# Patient Record
Sex: Female | Born: 1960 | Hispanic: No | Marital: Married | Smoking: Former smoker
Health system: Southern US, Community
[De-identification: ages and names within clinical notes are randomized; demographics above are authoritative.]

## PROBLEM LIST (undated history)

## (undated) DIAGNOSIS — D649 Anemia, unspecified: Secondary | ICD-10-CM

## (undated) DIAGNOSIS — K5790 Diverticulosis of intestine, part unspecified, without perforation or abscess without bleeding: Secondary | ICD-10-CM

## (undated) DIAGNOSIS — N809 Endometriosis, unspecified: Secondary | ICD-10-CM

## (undated) DIAGNOSIS — G8929 Other chronic pain: Secondary | ICD-10-CM

## (undated) DIAGNOSIS — K635 Polyp of colon: Secondary | ICD-10-CM

## (undated) DIAGNOSIS — K219 Gastro-esophageal reflux disease without esophagitis: Secondary | ICD-10-CM

## (undated) DIAGNOSIS — R011 Cardiac murmur, unspecified: Secondary | ICD-10-CM

## (undated) DIAGNOSIS — H269 Unspecified cataract: Secondary | ICD-10-CM

## (undated) DIAGNOSIS — K625 Hemorrhage of anus and rectum: Secondary | ICD-10-CM

## (undated) DIAGNOSIS — K649 Unspecified hemorrhoids: Secondary | ICD-10-CM

## (undated) DIAGNOSIS — A048 Other specified bacterial intestinal infections: Secondary | ICD-10-CM

## (undated) DIAGNOSIS — D126 Benign neoplasm of colon, unspecified: Secondary | ICD-10-CM

## (undated) DIAGNOSIS — F419 Anxiety disorder, unspecified: Secondary | ICD-10-CM

## (undated) DIAGNOSIS — M549 Dorsalgia, unspecified: Secondary | ICD-10-CM

## (undated) DIAGNOSIS — K760 Fatty (change of) liver, not elsewhere classified: Secondary | ICD-10-CM

## (undated) DIAGNOSIS — M1990 Unspecified osteoarthritis, unspecified site: Secondary | ICD-10-CM

## (undated) DIAGNOSIS — E079 Disorder of thyroid, unspecified: Secondary | ICD-10-CM

## (undated) DIAGNOSIS — K648 Other hemorrhoids: Secondary | ICD-10-CM

## (undated) DIAGNOSIS — Q249 Congenital malformation of heart, unspecified: Secondary | ICD-10-CM

## (undated) HISTORY — DX: Polyp of colon: K63.5

## (undated) HISTORY — DX: Other hemorrhoids: K64.8

## (undated) HISTORY — DX: Other specified bacterial intestinal infections: A04.8

## (undated) HISTORY — DX: Disorder of thyroid, unspecified: E07.9

## (undated) HISTORY — DX: Unspecified osteoarthritis, unspecified site: M19.90

## (undated) HISTORY — PX: BREAST BIOPSY: SHX20

## (undated) HISTORY — DX: Unspecified hemorrhoids: K64.9

## (undated) HISTORY — DX: Cardiac murmur, unspecified: R01.1

## (undated) HISTORY — DX: Hemorrhage of anus and rectum: K62.5

## (undated) HISTORY — DX: Anemia, unspecified: D64.9

## (undated) HISTORY — DX: Gastro-esophageal reflux disease without esophagitis: K21.9

## (undated) HISTORY — DX: Congenital malformation of heart, unspecified: Q24.9

## (undated) HISTORY — DX: Endometriosis, unspecified: N80.9

## (undated) HISTORY — PX: MENISCUS REPAIR: SHX5179

## (undated) HISTORY — DX: Anxiety disorder, unspecified: F41.9

## (undated) HISTORY — DX: Fatty (change of) liver, not elsewhere classified: K76.0

## (undated) HISTORY — DX: Benign neoplasm of colon, unspecified: D12.6

## (undated) HISTORY — DX: Unspecified cataract: H26.9

## (undated) HISTORY — DX: Diverticulosis of intestine, part unspecified, without perforation or abscess without bleeding: K57.90

## (undated) NOTE — *Deleted (*Deleted)
Thank you for coming to see me today. It was a pleasure  Please follow-up with PCP as needed  If you have any questions or concerns, please do not hesitate to call the office at [phone].  Best,   Tanya Walsh, MD Family Medicine Residency   Outpatient Mental Health Providers (No Insurance required or Self Pay)  [facility]  [address] [place], [place] [phone] Crisis [phone]  [facility] ([place]) can see uninsured folks for outpatient therapy [email or web address] [address] [place], [place] [address] [phone]  [facility]    Walk-in Mon-Fri, 8am-3pm [email or web address] [address], [place], [place]  [phone]   [address]  [place] [phone] [facility] [place] [facility] for psych med management, there may be a wait- if [facility] is working with clients for OPT, they will coordinate with [facility] for psych  [facility]   Walk-in-Clinic: Monday- Friday 9:00 AM - 4:00 PM [address]   [place], [place] [phone]  [facility] [place] ([place]) walk in M-F 8am-12pm and  1pm-3pm [place]- [address]     [phone]  [place] -[address]  Phone: [phone]  [facility] (Mental Health and substance challenges) [address]   [place] [phone]    [email or web address]    [facility]  [place]     Phone:  [phone] [place]-  [address]  [phone]   [facility]  [address] [place]  [phone] [email or web address]   [facility] ( virtual or zoom therapy) [email or web address]  [address] [place]  [phone]    [facility] [phone]  grief counseling, dementia and caregiver support    [facility] Walk-in MWF 12:30 to 3:00      [address] [place] [address]  [phone]  [email or web address] call to schedule an appointment    [facility]  Wellness Classes ,Support group, Peer support services, [address], [place], [place] [address] [phone]  www.[email or web address]           [facility] on [facility] (NAMI) [place]- Wellness classes, Support groups        [address], [place], [place] [address] [phone]   https://www.nami.org/   [facility]  (Psycho-social Rehabilitation clubhouse, Individual and group therapy) [address] , [place] [address]   [phone]  24- Hour Availability:  *[facility] [phone] or 1-800-711-2635 * [facility] Crisis (Domestic Violence, Rape, etc. )[phone] * [facility] [phone] or [phone] * [facility] [place] [facility] [phone](8am-4pm only) [phone] (after hours) *[facility] Unit 1-877-626-1772 *[place] National Suicide Hotline 1-800-273-8255 (TALK)    

---

## 1988-10-27 HISTORY — PX: DIAGNOSTIC LAPAROSCOPY: SUR761

## 2000-02-27 ENCOUNTER — Emergency Department (HOSPITAL_COMMUNITY): Admission: EM | Admit: 2000-02-27 | Discharge: 2000-02-27 | Payer: Self-pay | Admitting: Emergency Medicine

## 2000-05-06 ENCOUNTER — Emergency Department (HOSPITAL_COMMUNITY): Admission: EM | Admit: 2000-05-06 | Discharge: 2000-05-06 | Payer: Self-pay | Admitting: Emergency Medicine

## 2001-01-15 ENCOUNTER — Other Ambulatory Visit: Admission: RE | Admit: 2001-01-15 | Discharge: 2001-01-15 | Payer: Self-pay | Admitting: Family Medicine

## 2001-01-20 ENCOUNTER — Other Ambulatory Visit: Admission: RE | Admit: 2001-01-20 | Discharge: 2001-01-20 | Payer: Self-pay | Admitting: Family Medicine

## 2001-01-20 ENCOUNTER — Encounter: Admission: RE | Admit: 2001-01-20 | Discharge: 2001-01-20 | Payer: Self-pay | Admitting: Family Medicine

## 2001-01-20 ENCOUNTER — Encounter (INDEPENDENT_AMBULATORY_CARE_PROVIDER_SITE_OTHER): Payer: Self-pay | Admitting: *Deleted

## 2001-01-20 ENCOUNTER — Encounter: Payer: Self-pay | Admitting: Family Medicine

## 2001-04-27 ENCOUNTER — Emergency Department (HOSPITAL_COMMUNITY): Admission: EM | Admit: 2001-04-27 | Discharge: 2001-04-27 | Payer: Self-pay | Admitting: Emergency Medicine

## 2001-08-18 ENCOUNTER — Encounter: Payer: Self-pay | Admitting: Family Medicine

## 2001-08-18 ENCOUNTER — Encounter: Admission: RE | Admit: 2001-08-18 | Discharge: 2001-08-18 | Payer: Self-pay | Admitting: Family Medicine

## 2002-02-23 ENCOUNTER — Other Ambulatory Visit: Admission: RE | Admit: 2002-02-23 | Discharge: 2002-02-23 | Payer: Self-pay | Admitting: Obstetrics and Gynecology

## 2002-03-15 ENCOUNTER — Encounter: Admission: RE | Admit: 2002-03-15 | Discharge: 2002-03-15 | Payer: Self-pay | Admitting: Family Medicine

## 2002-03-15 ENCOUNTER — Encounter: Payer: Self-pay | Admitting: Family Medicine

## 2003-08-03 ENCOUNTER — Encounter: Admission: RE | Admit: 2003-08-03 | Discharge: 2003-08-03 | Payer: Self-pay | Admitting: Family Medicine

## 2003-08-03 ENCOUNTER — Encounter: Payer: Self-pay | Admitting: Family Medicine

## 2003-08-04 ENCOUNTER — Other Ambulatory Visit: Admission: RE | Admit: 2003-08-04 | Discharge: 2003-08-04 | Payer: Self-pay | Admitting: Obstetrics and Gynecology

## 2004-03-21 ENCOUNTER — Encounter: Admission: RE | Admit: 2004-03-21 | Discharge: 2004-03-21 | Payer: Self-pay | Admitting: Obstetrics and Gynecology

## 2006-03-12 ENCOUNTER — Encounter: Admission: RE | Admit: 2006-03-12 | Discharge: 2006-03-12 | Payer: Self-pay | Admitting: Internal Medicine

## 2006-03-16 ENCOUNTER — Other Ambulatory Visit: Admission: RE | Admit: 2006-03-16 | Discharge: 2006-03-16 | Payer: Self-pay | Admitting: Obstetrics and Gynecology

## 2007-03-15 ENCOUNTER — Encounter: Admission: RE | Admit: 2007-03-15 | Discharge: 2007-03-15 | Payer: Self-pay | Admitting: Obstetrics and Gynecology

## 2008-01-19 ENCOUNTER — Encounter: Admission: RE | Admit: 2008-01-19 | Discharge: 2008-01-19 | Payer: Self-pay | Admitting: Obstetrics and Gynecology

## 2008-08-22 ENCOUNTER — Encounter: Admission: RE | Admit: 2008-08-22 | Discharge: 2008-08-22 | Payer: Self-pay | Admitting: Obstetrics and Gynecology

## 2009-08-23 ENCOUNTER — Encounter: Admission: RE | Admit: 2009-08-23 | Discharge: 2009-08-23 | Payer: Self-pay | Admitting: Obstetrics and Gynecology

## 2010-08-15 ENCOUNTER — Encounter: Admission: RE | Admit: 2010-08-15 | Discharge: 2010-08-15 | Payer: Self-pay | Admitting: Obstetrics and Gynecology

## 2010-09-04 ENCOUNTER — Encounter: Admission: RE | Admit: 2010-09-04 | Discharge: 2010-09-04 | Payer: Self-pay | Admitting: Obstetrics and Gynecology

## 2010-10-27 DIAGNOSIS — E079 Disorder of thyroid, unspecified: Secondary | ICD-10-CM

## 2010-10-27 HISTORY — DX: Disorder of thyroid, unspecified: E07.9

## 2010-11-17 ENCOUNTER — Encounter: Payer: Self-pay | Admitting: Obstetrics and Gynecology

## 2011-07-09 ENCOUNTER — Emergency Department (HOSPITAL_COMMUNITY)
Admission: EM | Admit: 2011-07-09 | Discharge: 2011-07-09 | Disposition: A | Discharge status: Home / Self Care | Payer: Medicaid Other | Attending: Emergency Medicine | Admitting: Emergency Medicine

## 2011-07-09 ENCOUNTER — Emergency Department (HOSPITAL_COMMUNITY): Payer: Medicaid Other

## 2011-07-09 DIAGNOSIS — Y92009 Unspecified place in unspecified non-institutional (private) residence as the place of occurrence of the external cause: Secondary | ICD-10-CM | POA: Insufficient documentation

## 2011-07-09 DIAGNOSIS — W2203XA Walked into furniture, initial encounter: Secondary | ICD-10-CM | POA: Insufficient documentation

## 2011-07-09 DIAGNOSIS — S59909A Unspecified injury of unspecified elbow, initial encounter: Secondary | ICD-10-CM | POA: Insufficient documentation

## 2011-07-09 DIAGNOSIS — S6990XA Unspecified injury of unspecified wrist, hand and finger(s), initial encounter: Secondary | ICD-10-CM | POA: Insufficient documentation

## 2011-07-09 DIAGNOSIS — M25439 Effusion, unspecified wrist: Secondary | ICD-10-CM | POA: Insufficient documentation

## 2011-07-09 DIAGNOSIS — S60219A Contusion of unspecified wrist, initial encounter: Secondary | ICD-10-CM | POA: Insufficient documentation

## 2011-07-09 DIAGNOSIS — M25539 Pain in unspecified wrist: Secondary | ICD-10-CM | POA: Insufficient documentation

## 2011-08-07 ENCOUNTER — Other Ambulatory Visit: Payer: Self-pay | Admitting: Obstetrics and Gynecology

## 2011-08-07 DIAGNOSIS — Z1231 Encounter for screening mammogram for malignant neoplasm of breast: Secondary | ICD-10-CM

## 2011-09-09 ENCOUNTER — Ambulatory Visit: Payer: Medicaid Other

## 2011-09-22 ENCOUNTER — Ambulatory Visit
Admission: RE | Admit: 2011-09-22 | Discharge: 2011-09-22 | Disposition: A | Discharge status: Home / Self Care | Payer: Medicaid Other | Source: Ambulatory Visit | Attending: Obstetrics and Gynecology | Admitting: Obstetrics and Gynecology

## 2011-09-22 DIAGNOSIS — Z1231 Encounter for screening mammogram for malignant neoplasm of breast: Secondary | ICD-10-CM

## 2011-10-28 HISTORY — PX: BIOPSY THYROID: PRO38

## 2011-12-25 ENCOUNTER — Emergency Department (HOSPITAL_COMMUNITY): Payer: Self-pay

## 2011-12-25 ENCOUNTER — Emergency Department (HOSPITAL_COMMUNITY)
Admission: EM | Admit: 2011-12-25 | Discharge: 2011-12-25 | Disposition: A | Discharge status: Home Health | Payer: Self-pay | Attending: Emergency Medicine | Admitting: Emergency Medicine

## 2011-12-25 ENCOUNTER — Encounter (HOSPITAL_COMMUNITY): Payer: Self-pay | Admitting: Emergency Medicine

## 2011-12-25 DIAGNOSIS — F17200 Nicotine dependence, unspecified, uncomplicated: Secondary | ICD-10-CM | POA: Insufficient documentation

## 2011-12-25 DIAGNOSIS — M549 Dorsalgia, unspecified: Secondary | ICD-10-CM | POA: Insufficient documentation

## 2011-12-25 DIAGNOSIS — R0602 Shortness of breath: Secondary | ICD-10-CM | POA: Insufficient documentation

## 2011-12-25 MED ORDER — ONDANSETRON HCL 4 MG PO TABS: 4.0000 mg | ORAL_TABLET | Freq: Four times a day (QID) | ORAL | Status: AC

## 2011-12-25 MED ORDER — OXYCODONE-ACETAMINOPHEN 5-325 MG PO TABS: 1.0000 | ORAL_TABLET | Freq: Four times a day (QID) | ORAL | Status: AC | PRN

## 2011-12-25 MED ORDER — CYCLOBENZAPRINE HCL 10 MG PO TABS
5.0000 mg | ORAL_TABLET | Freq: Once | ORAL | Status: AC
  Administered 2011-12-25: 5 mg via ORAL
  Filled 2011-12-25: qty 1

## 2011-12-25 MED ORDER — OXYCODONE-ACETAMINOPHEN 5-325 MG PO TABS
2.0000 | ORAL_TABLET | Freq: Once | ORAL | Status: AC
  Administered 2011-12-25: 2 via ORAL
  Filled 2011-12-25: qty 2

## 2011-12-25 MED ORDER — ONDANSETRON 4 MG PO TBDP
8.0000 mg | ORAL_TABLET | Freq: Once | ORAL | Status: AC
  Administered 2011-12-25: 8 mg via ORAL
  Filled 2011-12-25: qty 2

## 2011-12-25 NOTE — ED Notes (Signed)
Patient with back pain that started on Sunday night.  Patient states she has been trying OTC meds and muscle relaxants, with no relief of pain.  Denies any injury.

## 2011-12-25 NOTE — Discharge Instructions (Signed)
Back Exercises Back exercises help treat and prevent back injuries. The goal of back exercises is to increase the strength of your abdominal and back muscles and the flexibility of your back. These exercises should be started when you no longer have back pain. Back exercises include:  Pelvic Tilt. Lie on your back with your knees bent. Tilt your pelvis until the lower part of your back is against the floor. Hold this position 5 to 10 sec and repeat 5 to 10 times.   Knee to Chest. Pull first 1 knee up against your chest and hold for 20 to 30 seconds, repeat this with the other knee, and then both knees. This may be done with the other leg straight or bent, whichever feels better.   Sit-Ups or Curl-Ups. Bend your knees 90 degrees. Start with tilting your pelvis, and do a partial, slow sit-up, lifting your trunk only 30 to 45 degrees off the floor. Take at least 2 to 3 seconds for each sit-up. Do not do sit-ups with your knees out straight. If partial sit-ups are difficult, simply do the above but with only tightening your abdominal muscles and holding it as directed.   Hip-Lift. Lie on your back with your knees flexed 90 degrees. Push down with your feet and shoulders as you raise your hips a couple inches off the floor; hold for 10 seconds, repeat 5 to 10 times.   Back arches. Lie on your stomach, propping yourself up on bent elbows. Slowly press on your hands, causing an arch in your low back. Repeat 3 to 5 times. Any initial stiffness and discomfort should lessen with repetition over time.   Shoulder-Lifts. Lie face down with arms beside your body. Keep hips and torso pressed to floor as you slowly lift your head and shoulders off the floor.  Do not overdo your exercises, especially in the beginning. Exercises may cause you some mild back discomfort which lasts for a few minutes; however, if the pain is more severe, or lasts for more than 15 minutes, do not continue exercises until you see your  caregiver. Improvement with exercise therapy for back problems is slow.  See your caregivers for assistance with developing a proper back exercise program. Document Released: 11/20/2004 Document Revised: 06/11/2011 Document Reviewed: 10/13/2005 ExitCare Patient Information 2012 [employer].Back Pain, Adult Low back pain is very common. About 1 in 5 people have back pain.The cause of low back pain is rarely dangerous. The pain often gets better over time.About half of people with a sudden onset of back pain feel better in just 2 weeks. About 8 in 10 people feel better by 6 weeks.  CAUSES Some common causes of back pain include:  Strain of the muscles or ligaments supporting the spine.   Wear and tear (degeneration) of the spinal discs.   Arthritis.   Direct injury to the back.  DIAGNOSIS Most of the time, the direct cause of low back pain is not known.However, back pain can be treated effectively even when the exact cause of the pain is unknown.Answering your caregiver's questions about your overall health and symptoms is one of the most accurate ways to make sure the cause of your pain is not dangerous. If your caregiver needs more information, he or she may order lab work or imaging tests (X-rays or MRIs).However, even if imaging tests show changes in your back, this usually does not require surgery. HOME CARE INSTRUCTIONS For many people, back pain returns.Since low back pain is rarely   dangerous, it is often a condition that people can learn to manageon their own.   Remain active. It is stressful on the back to sit or stand in one place. Do not sit, drive, or stand in one place for more than 30 minutes at a time. Take short walks on level surfaces as soon as pain allows.Try to increase the length of time you walk each day.   Do not stay in bed.Resting more than 1 or 2 days can delay your recovery.   Do not avoid exercise or work.Your body is made to move.It is not dangerous  to be active, even though your back may hurt.Your back will likely heal faster if you return to being active before your pain is gone.   Pay attention to your body when you bend and lift. Many people have less discomfortwhen lifting if they bend their knees, keep the load close to their bodies,and avoid twisting. Often, the most comfortable positions are those that put less stress on your recovering back.   Find a comfortable position to sleep. Use a firm mattress and lie on your side with your knees slightly bent. If you lie on your back, put a pillow under your knees.   Only take over-the-counter or prescription medicines as directed by your caregiver. Over-the-counter medicines to reduce pain and inflammation are often the most helpful.Your caregiver may prescribe muscle relaxant drugs.These medicines help dull your pain so you can more quickly return to your normal activities and healthy exercise.   Put ice on the injured area.   Put ice in a plastic bag.   Place a towel between your skin and the bag.   Leave the ice on for 15 to 20 minutes, 3 to 4 times a day for the first 2 to 3 days. After that, ice and heat may be alternated to reduce pain and spasms.   Ask your caregiver about trying back exercises and gentle massage. This may be of some benefit.   Avoid feeling anxious or stressed.Stress increases muscle tension and can worsen back pain.It is important to recognize when you are anxious or stressed and learn ways to manage it.Exercise is a great option.  SEEK MEDICAL CARE IF:  You have pain that is not relieved with rest or medicine.   You have pain that does not improve in 1 week.   You have new symptoms.   You are generally not feeling well.  SEEK IMMEDIATE MEDICAL CARE IF:   You have pain that radiates from your back into your legs.   You develop new bowel or bladder control problems.   You have unusual weakness or numbness in your arms or legs.   You develop  nausea or vomiting.   You develop abdominal pain.   You feel faint.  Document Released: 10/13/2005 Document Revised: 06/25/2011 Document Reviewed: 03/03/2011 ExitCare Patient Information 2012 [employer]. 

## 2011-12-25 NOTE — ED Provider Notes (Signed)
History     CSN: [id number]  Arrival date & time 12/25/11  0630   First MD Initiated Contact with Patient 12/25/11 [phone]      Chief Complaint  Patient presents with  . Back Pain    (Consider location/radiation/quality/duration/timing/severity/associated sxs/prior treatment) HPI  Pt presents to the ED with complaints of back pain. She has known back problems for which she see's a Land. She states that the symptoms started o nSunday night. She has tried OTC medications but she states they are not working. The pain is worse when she moves. She states that she also has a constant dull ache that worsens when she tries to sit up or turns her torso. The symptoms she currently has are similar to her other back pain symptoms in the past. She denies weakness, dizziness, urinary or bowel symptoms.  History reviewed. No pertinent past medical history.  History reviewed. No pertinent past surgical history.  History reviewed. No pertinent family history.  History  Substance Use Topics  . Smoking status: Current Everyday Smoker -- 0.5 packs/day for 15 years    Types: Cigarettes  . Smokeless tobacco: Not on file  . Alcohol Use: Yes    OB History    Grav Para Term Preterm Abortions TAB SAB Ect Mult Living                  Review of Systems  Allergies  Review of patient's allergies indicates no known allergies.  Home Medications   Current Outpatient Rx  Name Route Sig Dispense Refill  . ALPRAZOLAM 0.25 MG PO TABS Oral Take 0.25 mg by mouth at bedtime as needed. For sleep Rarely used    . CHOLECALCIFEROL 400 UNITS PO TABS Oral Take 400 Units by mouth daily.    . CYCLOBENZAPRINE HCL 5 MG PO TABS Oral Take 5 mg by mouth once. pain    . IBUPROFEN 200 MG PO TABS Oral Take 400 mg by mouth once. pain    . MAGNESIUM CHLORIDE 64 MG PO TBEC Oral Take by mouth.    . ONDANSETRON HCL 4 MG PO TABS Oral Take 1 tablet (4 mg total) by mouth every 6 (six) hours. 12 tablet 0  .  OXYCODONE-ACETAMINOPHEN 5-325 MG PO TABS Oral Take 1 tablet by mouth every 6 (six) hours as needed for pain. 10 tablet 0    BP 129/78  Pulse 75  Temp(Src) 97.4 F (36.3 C) (Oral)  Resp 20  SpO2 100%  Physical Exam  Nursing note and vitals reviewed. Constitutional: She appears well-developed and well-nourished. No distress.  HENT:  Head: Normocephalic and atraumatic.  Eyes: Pupils are equal, round, and reactive to light.  Neck: Normal range of motion. Neck supple.  Cardiovascular: Normal rate and regular rhythm.   Pulmonary/Chest: Effort normal.  Abdominal: Soft.  Musculoskeletal:       Thoracic back: She exhibits decreased range of motion and spasm. She exhibits no tenderness, no bony tenderness, no swelling, no edema, no deformity, no laceration, no pain and normal pulse.       Back:  Neurological: She is alert.  Skin: Skin is warm and dry.    ED Course  Procedures (including critical care time)  Labs Reviewed - No data to display Dg Chest 2 View  12/25/2011  *RADIOLOGY REPORT*  Clinical Data: Shortness of breath.  Right-sided back pain.  CHEST - 2 VIEW  Comparison: None.  Findings: The heart size and vascularity are normal and the lungs are clear.  No  osseous abnormality.  IMPRESSION: Normal chest.  Original Report Authenticated By: Gwynn Burly, M.D.     1. Back pain       MDM  Pts xray of the chest shows no abnormalities within the lungs and the lateral view shows normal alinement of patient Thoracic spine. PT sees a Land as she has a history of back pains and informs me that she will  Follow-up with her doctor.Pt has flexeril at home. Will give Rx for Percocets (10 tabs) and Zofran.       Dorthula Matas, PA 12/25/11 [phone]

## 2011-12-25 NOTE — ED Provider Notes (Signed)
Medical screening examination/treatment/procedure(s) were performed by non-physician practitioner and as supervising physician I was immediately available for consultation/collaboration.   Nakina Spatz, MD 12/25/11 1036 

## 2012-04-28 ENCOUNTER — Emergency Department (HOSPITAL_COMMUNITY): Payer: Self-pay

## 2012-04-28 ENCOUNTER — Encounter (HOSPITAL_COMMUNITY): Payer: Self-pay | Admitting: *Deleted

## 2012-04-28 ENCOUNTER — Emergency Department (HOSPITAL_COMMUNITY)
Admission: EM | Admit: 2012-04-28 | Discharge: 2012-04-28 | Disposition: A | Discharge status: Home / Self Care | Payer: Self-pay | Attending: Emergency Medicine | Admitting: Emergency Medicine

## 2012-04-28 DIAGNOSIS — M545 Low back pain, unspecified: Secondary | ICD-10-CM | POA: Insufficient documentation

## 2012-04-28 DIAGNOSIS — M25579 Pain in unspecified ankle and joints of unspecified foot: Secondary | ICD-10-CM

## 2012-04-28 DIAGNOSIS — W19XXXA Unspecified fall, initial encounter: Secondary | ICD-10-CM

## 2012-04-28 DIAGNOSIS — W010XXA Fall on same level from slipping, tripping and stumbling without subsequent striking against object, initial encounter: Secondary | ICD-10-CM | POA: Insufficient documentation

## 2012-04-28 DIAGNOSIS — Y9229 Other specified public building as the place of occurrence of the external cause: Secondary | ICD-10-CM | POA: Insufficient documentation

## 2012-04-28 DIAGNOSIS — M549 Dorsalgia, unspecified: Secondary | ICD-10-CM

## 2012-04-28 DIAGNOSIS — F17200 Nicotine dependence, unspecified, uncomplicated: Secondary | ICD-10-CM | POA: Insufficient documentation

## 2012-04-28 HISTORY — DX: Other chronic pain: G89.29

## 2012-04-28 HISTORY — DX: Dorsalgia, unspecified: M54.9

## 2012-04-28 MED ORDER — IBUPROFEN 400 MG PO TABS
800.0000 mg | ORAL_TABLET | Freq: Once | ORAL | Status: AC
  Administered 2012-04-28: 800 mg via ORAL
  Filled 2012-04-28: qty 2

## 2012-04-28 MED ORDER — IBUPROFEN 800 MG PO TABS: 800.0000 mg | ORAL_TABLET | Freq: Three times a day (TID) | ORAL | Status: AC

## 2012-04-28 MED ORDER — OXYCODONE-ACETAMINOPHEN 5-325 MG PO TABS: 1.0000 | ORAL_TABLET | ORAL | Status: AC | PRN

## 2012-04-28 NOTE — ED Notes (Signed)
Pt slipped in [employer] on a wet floor.  No fall.  Pt did a quick twisting movement when this occurred and has pain in her left ankle and in her back now.  Pt is ambulatory

## 2012-04-28 NOTE — ED Provider Notes (Signed)
History     CSN: [id number]  Arrival date & time 04/28/12  3474   First MD Initiated Contact with Patient 04/28/12 2136      Chief Complaint  Patient presents with  . Back Pain    (Consider location/radiation/quality/duration/timing/severity/associated sxs/prior treatment) Patient is a 51 y.o. female presenting with back pain. The history is provided by the patient. No language interpreter was used.  Back Pain  This is a new problem. The current episode started 3 to 5 hours ago. The problem occurs constantly. The problem has not changed since onset.The pain is associated with falling. The pain is present in the lumbar spine. The pain does not radiate. The pain is at a severity of 7/10. Pertinent negatives include no fever, no numbness, no perianal numbness, no bladder incontinence, no dysuria, no leg pain, no paresthesias, no paresis, no tingling and no weakness. She has tried nothing for the symptoms.   States that she was in [employer] and slipped on some wine that was on the floor from a broken bottle. C/o LL back pain and L ankle pain.   pmh chronic back pain. Refused crutches. + cms to ankle.  Neuro in tact to LE. Past Medical History  Diagnosis Date  . Chronic back pain     History reviewed. No pertinent past surgical history.  No family history on file.  History  Substance Use Topics  . Smoking status: Current Everyday Smoker -- 0.5 packs/day for 15 years    Types: Cigarettes  . Smokeless tobacco: Not on file  . Alcohol Use: Yes    OB History    Grav Para Term Preterm Abortions TAB SAB Ect Mult Living                  Review of Systems  Constitutional: Negative.  Negative for fever.  HENT: Negative.   Eyes: Negative.   Respiratory: Negative.   Cardiovascular: Negative.   Gastrointestinal: Negative.   Genitourinary: Negative for bladder incontinence and dysuria.  Musculoskeletal: Positive for back pain. Negative for gait problem.       L ankle pain    Neurological: Negative.  Negative for tingling, weakness, numbness and paresthesias.  Psychiatric/Behavioral: Negative.   All other systems reviewed and are negative.    Allergies  Review of patient's allergies indicates no known allergies.  Home Medications  No current outpatient prescriptions on file.  BP 126/90  Pulse 70  Temp 99 F (37.2 C) (Oral)  Resp 20  SpO2 96%  Physical Exam  Nursing note and vitals reviewed. Constitutional: She is oriented to person, place, and time. She appears well-developed and well-nourished.  HENT:  Head: Normocephalic and atraumatic.  Eyes: Conjunctivae and EOM are normal. Pupils are equal, round, and reactive to light.  Neck: Normal range of motion. Neck supple.  Cardiovascular: Normal rate.   Pulmonary/Chest: Effort normal.  Abdominal: Soft.  Musculoskeletal: She exhibits tenderness. She exhibits no edema.       Limited ROM to L ankle  Lumbar spine tenderness  Neurological: She is alert and oriented to person, place, and time. She has normal reflexes.  Skin: Skin is warm and dry.  Psychiatric: She has a normal mood and affect.    ED Course  Procedures (including critical care time)  Labs Reviewed - No data to display Dg Ankle Complete Left  04/28/2012  *RADIOLOGY REPORT*  Clinical Data: Twisting injury, lateral pain.  LEFT ANKLE COMPLETE - 3+ VIEW  Comparison: None.  Findings: No acute  bony abnormality.  Specifically, no fracture, subluxation, or dislocation.  Soft tissues are intact.  IMPRESSION: Normal study.  Original Report Authenticated By: Cyndie Chime, M.D.     No diagnosis found.    MDM  Fall at [facility] with back pain and Lumbar pain. Lumbar and ankle films negative for fx.   No red flags.  No cauda equina symptoms.  Ibuprofen/ice in the ER.  Better wants to go home.  Refused crutches.  rx for percocet.  Follow up with pcp of choice.           Remi Haggard, NP 04/29/12 1824

## 2012-05-03 NOTE — ED Provider Notes (Signed)
Medical screening examination/treatment/procedure(s) were performed by non-physician practitioner and as supervising physician I was immediately available for consultation/collaboration.  Krystiana Fornes, MD 05/03/12 0717 

## 2012-07-14 ENCOUNTER — Ambulatory Visit: Payer: Medicaid Other | Admitting: Family Medicine

## 2012-07-20 ENCOUNTER — Telehealth: Payer: Self-pay | Admitting: Obstetrics and Gynecology

## 2012-07-20 NOTE — Telephone Encounter (Signed)
Jo to address  

## 2012-07-20 NOTE — Telephone Encounter (Signed)
TRIAGE/ADVICE °

## 2012-07-21 ENCOUNTER — Telehealth: Payer: Self-pay

## 2012-07-21 NOTE — Telephone Encounter (Signed)
LM for pt to cb to let me know if one of a couple of appt choices will work for her. The first choice is 08/02/2012 @ 11:15 w/ Dr. Su Hilt or 07/29/2012 @ 10:30 w/ EP. I will wait to hear back from pt.

## 2012-07-29 ENCOUNTER — Ambulatory Visit (INDEPENDENT_AMBULATORY_CARE_PROVIDER_SITE_OTHER): Payer: Medicaid Other | Admitting: Family Medicine

## 2012-07-29 ENCOUNTER — Encounter: Payer: Self-pay | Admitting: Family Medicine

## 2012-07-29 VITALS — BP 116/74 | HR 77 | Temp 99.3°F | Ht 69.0 in | Wt 144.0 lb

## 2012-07-29 DIAGNOSIS — E041 Nontoxic single thyroid nodule: Secondary | ICD-10-CM

## 2012-07-29 DIAGNOSIS — K921 Melena: Secondary | ICD-10-CM

## 2012-07-29 DIAGNOSIS — Z9889 Other specified postprocedural states: Secondary | ICD-10-CM

## 2012-07-29 DIAGNOSIS — K219 Gastro-esophageal reflux disease without esophagitis: Secondary | ICD-10-CM | POA: Insufficient documentation

## 2012-07-29 DIAGNOSIS — J019 Acute sinusitis, unspecified: Secondary | ICD-10-CM

## 2012-07-29 DIAGNOSIS — N809 Endometriosis, unspecified: Secondary | ICD-10-CM

## 2012-07-29 MED ORDER — DOXYCYCLINE HYCLATE 100 MG PO TABS: 100.0000 mg | ORAL_TABLET | Freq: Two times a day (BID) | ORAL | Status: DC

## 2012-07-29 NOTE — Patient Instructions (Signed)
Nice to meet you. You may have early sinus infection. If your symptoms are worsening in next 4 days, you may start antibiotic. Please come back if you develop shortness of breath, fever or worsening symptoms. Great job on stopping smoking! We can do yearly physical, or make appointment whenever you need.  Sinusitis Sinusitis is redness, soreness, and swelling (inflammation) of the paranasal sinuses. Paranasal sinuses are air pockets within the bones of your face (beneath the eyes, the middle of the forehead, or above the eyes). In healthy paranasal sinuses, mucus is able to drain out, and air is able to circulate through them by way of your nose. However, when your paranasal sinuses are inflamed, mucus and air can become trapped. This can allow bacteria and other germs to grow and cause infection. Sinusitis can develop quickly and last only a short time (acute) or continue over a long period (chronic). Sinusitis that lasts for more than 12 weeks is considered chronic.  CAUSES  Causes of sinusitis include:  Allergies.  Structural abnormalities, such as displacement of the cartilage that separates your nostrils (deviated septum), which can decrease the air flow through your nose and sinuses and affect sinus drainage.  Functional abnormalities, such as when the small hairs (cilia) that line your sinuses and help remove mucus do not work properly or are not present. SYMPTOMS  Symptoms of acute and chronic sinusitis are the same. The primary symptoms are pain and pressure around the affected sinuses. Other symptoms include:  Upper toothache.  Earache.  Headache.  Bad breath.  Decreased sense of smell and taste.  A cough, which worsens when you are lying flat.  Fatigue.  Fever.  Thick drainage from your nose, which often is green and may contain pus (purulent).  Swelling and warmth over the affected sinuses. DIAGNOSIS  Your caregiver will perform a physical exam. During the exam,  your caregiver may:  Look in your nose for signs of abnormal growths in your nostrils (nasal polyps).  Tap over the affected sinus to check for signs of infection.  View the inside of your sinuses (endoscopy) with a special imaging device with a light attached (endoscope), which is inserted into your sinuses. If your caregiver suspects that you have chronic sinusitis, one or more of the following tests may be recommended:  Allergy tests.  Nasal culture A sample of mucus is taken from your nose and sent to a lab and screened for bacteria.  Nasal cytology A sample of mucus is taken from your nose and examined by your caregiver to determine if your sinusitis is related to an allergy. TREATMENT  Most cases of acute sinusitis are related to a viral infection and will resolve on their own within 10 days. Sometimes medicines are prescribed to help relieve symptoms (pain medicine, decongestants, nasal steroid sprays, or saline sprays).  However, for sinusitis related to a bacterial infection, your caregiver will prescribe antibiotic medicines. These are medicines that will help kill the bacteria causing the infection.  Rarely, sinusitis is caused by a fungal infection. In theses cases, your caregiver will prescribe antifungal medicine. For some cases of chronic sinusitis, surgery is needed. Generally, these are cases in which sinusitis recurs more than 3 times per year, despite other treatments. HOME CARE INSTRUCTIONS   Drink plenty of water. Water helps thin the mucus so your sinuses can drain more easily.  Use a humidifier.  Inhale steam 3 to 4 times a day (for example, sit in the bathroom with the shower running).  Apply a warm, moist washcloth to your face 3 to 4 times a day, or as directed by your caregiver.  Use saline nasal sprays to help moisten and clean your sinuses.  Take over-the-counter or prescription medicines for pain, discomfort, or fever only as directed by your  caregiver. SEEK IMMEDIATE MEDICAL CARE IF:  You have increasing pain or severe headaches.  You have nausea, vomiting, or drowsiness.  You have swelling around your face.  You have vision problems.  You have a stiff neck.  You have difficulty breathing. MAKE SURE YOU:   Understand these instructions.  Will watch your condition.  Will get help right away if you are not doing well or get worse. Document Released: 10/13/2005 Document Revised: 01/05/2012 Document Reviewed: 10/28/2011 [facility] Patient Information 2013 [place], [place].

## 2012-07-29 NOTE — Assessment & Plan Note (Signed)
Followed by Dr. Su Hilt. Had mirena. Advised patient she can have pap screening done at [facility] if she wishes in the future.

## 2012-07-29 NOTE — Assessment & Plan Note (Signed)
Patient agrees to call GI specialist for colonoscopy scheduling.

## 2012-07-29 NOTE — Assessment & Plan Note (Signed)
Now longer than 2 weeks, uncertain if early bacterial infection now. Advised observation next few days and may start doxycycline (given paper rx) if worsens or not improving in next 3-4 days.

## 2012-07-29 NOTE — Assessment & Plan Note (Signed)
Patient plans to f/u with surgeon who did biopsy. Refuses bloodwork today. No signs of dysregulation.

## 2012-07-29 NOTE — Progress Notes (Signed)
Subjective:    Patient ID: Gail Gonzalez, female    DOB: [birth year], 51 y.o.   MRN: [id number]  HPI New patient to establish care.  1. Congestion. C/o "head cold" for 2 weeks. Cough with yellow productive sputum. Nasal congestion, fullness in ears and sinuses. Initial improvement now worsening. Denies fever, facial pain, chest pain, dyspnea, wheezing.  2. Tobacco abuse. Quit smoking 2 months ago after family bought her e-cigarettes. No cig in 3 weeks. Started smoking as a teenager  Then quit, but picked up again in 1996-2013 about 0.5 ppd. Denies dyspnea, wheezing.  3. Thyroid nodule. Found by OBGYN last year. Had a normal biopsy and bloodwork by surgeon, she is due for recheck now. Denies large change in weight, stool changes, skin rash, fatigue.  4. Endometriosis. Has an IUD placed by Dr. Ronelle Nigh. She plans to go get checked soon because she has been having period-like bleeding x 2 weeks. Hx laparascopic surgery for diagnostic purposes.  5. HM. Had normal pap 2012, yearly mammogram after a left breast lump found-biopsy negative she reports. Needs colonscopy. She reports seeing blood in her stool irregularly. Denies abdominal pain.  Past Medical History  Diagnosis Date  . Chronic back pain   . GERD (gastroesophageal reflux disease)   . Thyroid disease 2012    nodule  . Endometriosis     on lap surgery. seen by Dr. Su Hilt   Past Surgical History  Procedure Date  . Biopsy thyroid 2013   Family History  Problem Relation Age of Onset  . Alcohol abuse Mother   . Cancer Mother     breast  . Diabetes Mother   . Hypertension Mother   . Alcohol abuse Father    History   Social History  . Marital Status: Married    Spouse Name: N/A    Number of Children: N/A  . Years of Education: N/A   Occupational History  . Not on file.   Social History Main Topics  . Smoking status: Former Smoker -- 0.5 packs/day for 15 years    Types: Cigarettes    Quit date: 06/10/2012  .  Smokeless tobacco: Not on file  . Alcohol Use: Yes  . Drug Use: No  . Sexually Active: Yes   Other Topics Concern  . Not on file   Social History Narrative   High school graduate. Married to [employer]. Has 2 grown children in [place] and [place], [place]. Works full time at [employer].Smoking 1996-2013 -0.5 ppd.   Review of Systems  Constitutional: Negative for fever, appetite change, fatigue and unexpected weight change.  Respiratory: Negative for shortness of breath.   Cardiovascular: Negative for chest pain.  Gastrointestinal: Positive for blood in stool. Negative for nausea, vomiting, abdominal pain and constipation.  Genitourinary: Negative for dysuria.   Endorses some mild esophageal reflux. Knee pains. Otherwise see HPI.    Objective:   Physical Exam  Vitals reviewed. Constitutional: She is oriented to person, place, and time. She appears well-developed and well-nourished. No distress.  HENT:  Head: Normocephalic and atraumatic.       Mild pharyngeal redness. No exudates. Bil ant cervical LAD. nontender sinuses. Purulent nasal discharge. B TMs wnl.  Eyes: EOM are normal. Pupils are equal, round, and reactive to light.  Neck: Neck supple.  Cardiovascular: Normal rate and normal heart sounds.        II/VI syst murmur at apical area.  Pulmonary/Chest: Effort normal and breath sounds normal. No respiratory distress. She has no  wheezes. She has no rales.  Abdominal: Soft. Bowel sounds are normal. She exhibits no distension. There is no tenderness. There is no rebound and no guarding.  Musculoskeletal: She exhibits no edema and no tenderness.  Lymphadenopathy:    She has cervical adenopathy.  Neurological: She is alert and oriented to person, place, and time. No cranial nerve deficit. She exhibits normal muscle tone. Coordination normal.  Skin: No rash noted. She is not diaphoretic.  Psychiatric: She has a normal mood and affect.          Assessment & Plan:

## 2012-07-29 NOTE — Assessment & Plan Note (Signed)
No pain or red flags today. Discussed OTC options. Patient ok with current symptoms. Advised she needs a colonoscopy for both screening and positive endorsement of intermittent blood in stools. Provided with phone numbers and contact information, she agrees to schedule.

## 2012-08-02 ENCOUNTER — Encounter: Payer: Self-pay | Admitting: Obstetrics and Gynecology

## 2012-08-27 ENCOUNTER — Other Ambulatory Visit: Payer: Self-pay | Admitting: Obstetrics and Gynecology

## 2012-08-27 DIAGNOSIS — Z1231 Encounter for screening mammogram for malignant neoplasm of breast: Secondary | ICD-10-CM

## 2012-08-27 DIAGNOSIS — Z803 Family history of malignant neoplasm of breast: Secondary | ICD-10-CM

## 2012-09-14 ENCOUNTER — Ambulatory Visit (INDEPENDENT_AMBULATORY_CARE_PROVIDER_SITE_OTHER): Payer: Medicaid Other | Admitting: Family Medicine

## 2012-09-14 ENCOUNTER — Encounter: Payer: Self-pay | Admitting: Family Medicine

## 2012-09-14 DIAGNOSIS — M549 Dorsalgia, unspecified: Secondary | ICD-10-CM

## 2012-09-14 MED ORDER — CYCLOBENZAPRINE HCL 10 MG PO TABS: 10.0000 mg | ORAL_TABLET | Freq: Three times a day (TID) | ORAL | Status: DC | PRN

## 2012-09-14 MED ORDER — TRAMADOL HCL 50 MG PO TABS: 50.0000 mg | ORAL_TABLET | Freq: Three times a day (TID) | ORAL | Status: DC | PRN
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
## 2012-09-17 ENCOUNTER — Encounter: Payer: Self-pay | Admitting: Family Medicine

## 2012-09-17 DIAGNOSIS — M549 Dorsalgia, unspecified: Secondary | ICD-10-CM | POA: Insufficient documentation

## 2012-10-04 ENCOUNTER — Ambulatory Visit
Admission: RE | Admit: 2012-10-04 | Discharge: 2012-10-04 | Disposition: A | Discharge status: Home / Self Care | Payer: Medicaid Other | Source: Ambulatory Visit | Attending: Obstetrics and Gynecology | Admitting: Obstetrics and Gynecology

## 2012-10-04 DIAGNOSIS — Z1231 Encounter for screening mammogram for malignant neoplasm of breast: Secondary | ICD-10-CM

## 2012-10-04 DIAGNOSIS — Z803 Family history of malignant neoplasm of breast: Secondary | ICD-10-CM

## 2012-10-06 ENCOUNTER — Ambulatory Visit (INDEPENDENT_AMBULATORY_CARE_PROVIDER_SITE_OTHER): Payer: Medicaid Other | Admitting: Obstetrics and Gynecology

## 2012-10-06 ENCOUNTER — Encounter: Payer: Self-pay | Admitting: Obstetrics and Gynecology

## 2012-10-06 VITALS — BP 100/60 | HR 70 | Resp 16 | Ht 69.0 in | Wt 148.0 lb

## 2012-10-06 DIAGNOSIS — Z0000 Encounter for general adult medical examination without abnormal findings: Secondary | ICD-10-CM

## 2012-10-06 DIAGNOSIS — E010 Iodine-deficiency related diffuse (endemic) goiter: Secondary | ICD-10-CM

## 2012-10-06 DIAGNOSIS — N920 Excessive and frequent menstruation with regular cycle: Secondary | ICD-10-CM

## 2012-10-06 DIAGNOSIS — E049 Nontoxic goiter, unspecified: Secondary | ICD-10-CM

## 2012-10-06 DIAGNOSIS — Z124 Encounter for screening for malignant neoplasm of cervix: Secondary | ICD-10-CM

## 2012-10-06 DIAGNOSIS — Z139 Encounter for screening, unspecified: Secondary | ICD-10-CM

## 2012-10-06 LAB — CBC
MCHC: 33.8 g/dL (ref 30.0–36.0)
Platelets: 331 10*3/uL (ref 150–400)
RDW: 13.8 % (ref 11.5–15.5)

## 2012-10-06 MED ORDER — ZOLPIDEM TARTRATE 10 MG PO TABS: 10.0000 mg | ORAL_TABLET | Freq: Every evening | ORAL | Status: DC | PRN

## 2012-10-06 MED ORDER — ALPRAZOLAM 0.25 MG PO TABS: 0.2500 mg | ORAL_TABLET | Freq: Two times a day (BID) | ORAL | Status: DC | PRN
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
## 2012-10-07 LAB — PAP IG W/ RFLX HPV ASCU

## 2012-10-07 LAB — TSH: TSH: 0.86 u[IU]/mL (ref 0.350–4.500)

## 2012-10-07 LAB — PROLACTIN: Prolactin: 10.7 ng/mL

## 2012-10-11 ENCOUNTER — Other Ambulatory Visit: Payer: Self-pay | Admitting: Obstetrics and Gynecology

## 2012-10-11 ENCOUNTER — Other Ambulatory Visit: Payer: Medicaid Other

## 2012-10-11 ENCOUNTER — Other Ambulatory Visit: Payer: Self-pay

## 2012-10-11 ENCOUNTER — Telehealth: Payer: Self-pay

## 2012-10-11 DIAGNOSIS — N6310 Unspecified lump in the right breast, unspecified quadrant: Secondary | ICD-10-CM

## 2012-10-11 NOTE — Telephone Encounter (Signed)
LM that I sched appt for Dx mammo R breast for pt at the breast center for 10/19/2012 @ 09:15. I asked pt to cb to let me know that she got the message. Melody Comas A

## 2012-10-13 ENCOUNTER — Telehealth: Payer: Self-pay

## 2012-10-13 NOTE — Telephone Encounter (Signed)
Spoke to pt to let her know about her Dx Mammo and U/S on 10/19/2012 @ 09:20 @ [facility].[name], [name] A Also, pt cx'd her appt's for the 8th of Jan. She will r/s.

## 2012-10-19 ENCOUNTER — Ambulatory Visit
Admission: RE | Admit: 2012-10-19 | Discharge: 2012-10-19 | Disposition: A | Discharge status: Home / Self Care | Payer: Medicaid Other | Source: Ambulatory Visit | Attending: Obstetrics and Gynecology | Admitting: Obstetrics and Gynecology

## 2012-10-19 DIAGNOSIS — N6310 Unspecified lump in the right breast, unspecified quadrant: Secondary | ICD-10-CM

## 2012-11-01 ENCOUNTER — Ambulatory Visit
Admission: RE | Admit: 2012-11-01 | Discharge: 2012-11-01 | Disposition: A | Discharge status: Home / Self Care | Payer: Medicaid Other | Source: Ambulatory Visit | Attending: Obstetrics and Gynecology | Admitting: Obstetrics and Gynecology

## 2012-11-01 DIAGNOSIS — E010 Iodine-deficiency related diffuse (endemic) goiter: Secondary | ICD-10-CM

## 2012-11-03 ENCOUNTER — Encounter: Payer: Medicaid Other | Admitting: Obstetrics and Gynecology

## 2012-11-03 ENCOUNTER — Other Ambulatory Visit: Payer: Medicaid Other

## 2012-11-10 ENCOUNTER — Telehealth: Payer: Self-pay

## 2012-11-10 NOTE — Telephone Encounter (Signed)
LM for pt to cb re: getting scheduled for f/u , u/s and ebx per AR. Melody Comas A

## 2012-11-16 ENCOUNTER — Telehealth: Payer: Self-pay

## 2012-11-16 NOTE — Telephone Encounter (Signed)
LM for pt to cb to let me know if 12/08/2012 @ 2:30 for u/s and 3 pm appt. To f/u w/ AR + EBX will work for her. I will wait to hear back from her. Melody Comas A

## 2012-11-23 ENCOUNTER — Telehealth: Payer: Self-pay

## 2012-11-23 ENCOUNTER — Telehealth: Payer: Self-pay | Admitting: *Deleted

## 2012-11-23 DIAGNOSIS — E041 Nontoxic single thyroid nodule: Secondary | ICD-10-CM

## 2012-11-23 NOTE — Telephone Encounter (Signed)
Received message on Physician/Pharmacy line from [place] at [facility].  States Ms. Onofre has had a recent abnormal thyroid ultrasound and needs an ultrasound guided needle biopsy.  She needs a referral to an endocrinologist and since she has is medicaid, the referral needs to come from her PCP.   I spoke with Annice Pih.  Looks like patient has an endocrinologist at [facility].  I will place referral in Epic.  Ileana Ladd

## 2012-11-23 NOTE — Telephone Encounter (Signed)
Vaniah called back. She will have to check her work schedule to see when she will be able to schedule u/s, ebx and f/u. She also states that she has an Actor at [facility]. She has had a bx in the past. She will call back w/ contact info for her Dr. There so that I can fax the thyroid U/S result to him.  Dr. Talmage Nap is not accetpting new pt's at this time, either. Melody Comas A

## 2012-11-23 NOTE — Telephone Encounter (Signed)
I LM for pt to call me back re: a couple of things. She needs ASAP referral to Dr. Talmage Nap for abnl thyroid u/s. Her PCP needs to refer her, because of the Medicaid ins. I notified Dr. Cristal Ford of this and asked her office to call me. I called Dr. Willeen Cass office and aked them to cb . I need to know if their office takes Medicaid. Also, I need to speak to [facility] re: scheduling U/S, EBX and f/u with Dr. Su Hilt. Melody Comas A

## 2012-11-30 ENCOUNTER — Telehealth: Payer: Self-pay

## 2012-11-30 NOTE — Telephone Encounter (Signed)
Called pt to remind her to call me with info about Endocrinologist @ [facility]. And also to check her schedule and call me so that we can get her scheduled for U/S and EBX. Melody Comas A

## 2012-12-03 ENCOUNTER — Telehealth: Payer: Self-pay

## 2012-12-03 NOTE — Telephone Encounter (Signed)
Nariyah called with contact info for her Endocrinologist, Dr. Daiva Nakayama. I faxed records including notes, labs and recent thyroid ultrasound to this Dr @ [phone] for her to review and to inform pt of recommendations. Melody Comas A

## 2012-12-13 ENCOUNTER — Encounter: Payer: Self-pay | Admitting: Obstetrics and Gynecology

## 2012-12-13 ENCOUNTER — Other Ambulatory Visit: Payer: Self-pay | Admitting: Obstetrics and Gynecology

## 2012-12-13 ENCOUNTER — Ambulatory Visit: Payer: Medicaid Other

## 2012-12-13 ENCOUNTER — Ambulatory Visit: Payer: Medicaid Other | Admitting: Obstetrics and Gynecology

## 2012-12-13 VITALS — BP 98/68 | Resp 16 | Ht 66.5 in | Wt 150.0 lb

## 2012-12-13 DIAGNOSIS — N920 Excessive and frequent menstruation with regular cycle: Secondary | ICD-10-CM

## 2012-12-13 DIAGNOSIS — D251 Intramural leiomyoma of uterus: Secondary | ICD-10-CM

## 2012-12-13 NOTE — Progress Notes (Signed)
Here for f/u episode of bleeding in November.    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

## 2012-12-20 ENCOUNTER — Telehealth: Payer: Self-pay

## 2012-12-20 NOTE — Telephone Encounter (Signed)
LM for pt....responding to her call last Friday. Her Endocrinologist @ [facility] gave Gail Gonzalez the option to repeat thyroid U/S now or again in 6 months,as thyroid nodule has grown only slightly. [name] chooses to wait 6 mos and repeat then. She asks that Dr. Su Hilt refer her for that. I explained that that order should come from her Endocrinologist, as she is the one who is recommending it. Also, pt may need referral from Dr. Cristal Ford, PCP for this as well.Melody Comas A

## 2012-12-29 ENCOUNTER — Telehealth: Payer: Self-pay

## 2012-12-30 ENCOUNTER — Encounter: Payer: Self-pay | Admitting: Family Medicine

## 2012-12-30 ENCOUNTER — Telehealth: Payer: Self-pay | Admitting: Family Medicine

## 2012-12-30 ENCOUNTER — Ambulatory Visit (INDEPENDENT_AMBULATORY_CARE_PROVIDER_SITE_OTHER): Payer: Medicaid Other | Admitting: Family Medicine

## 2012-12-30 VITALS — BP 117/78 | HR 72 | Temp 98.3°F | Ht 69.0 in | Wt 149.5 lb

## 2012-12-30 DIAGNOSIS — M25569 Pain in unspecified knee: Secondary | ICD-10-CM

## 2012-12-30 DIAGNOSIS — M7120 Synovial cyst of popliteal space [Baker], unspecified knee: Secondary | ICD-10-CM | POA: Insufficient documentation

## 2012-12-30 MED ORDER — METHYLPREDNISOLONE ACETATE 40 MG/ML IJ SUSP
40.0000 mg | Freq: Once | INTRAMUSCULAR | Status: AC
  Administered 2012-12-30: 40 mg via INTRA_ARTICULAR

## 2012-12-30 NOTE — Assessment & Plan Note (Signed)
Injection today w/ some immediate relief.  Baker's cyst of considerable size, that appears to be non-communicative.  Mass effect on surounding structures No obvious infection. Pt likely to need complete excision Consider Gen Surge vs Vascular referral

## 2012-12-30 NOTE — Telephone Encounter (Signed)
Patient is wanting a referral for a colonoscopy. Pls call patient to schedule.

## 2012-12-30 NOTE — Progress Notes (Signed)
Gail Gonzalez is a 52 y.o. female who presents to [facility] today for L leg pain   Pain started approximately 6 months ago. Intermittent. Pain is throbbing. Primarily in the back of the knee and courses up and down the leg. Limps. Occasionally wakes up at night. Takes ibuprofen 400-800mg  at times w/ some relief. Improves w/ rest and elevation. Worse w/ standing and work.   Vein enlargement: issue for years for pt. Non-painful. Becoming larger. Family histo4ry of vein problems. Worse after long hours on feet. Denies LE swelling, SOB, CP.   The following portions of the patient's history were reviewed and updated as appropriate: allergies, current medications, past medical history, family and social history, and problem list.  Patient is a smoker (4 cigarettes daily)  Past Medical History  Diagnosis Date  . Chronic back pain   . GERD (gastroesophageal reflux disease)   . Thyroid disease 2012    nodule  . Endometriosis     on lap surgery. seen by Dr. Su Hilt    ROS as above otherwise neg.    Medications reviewed. Current Outpatient Prescriptions  Medication Sig Dispense Refill  . ALPRAZolam (XANAX) 0.25 MG tablet Take 1 tablet (0.25 mg total) by mouth 2 (two) times daily as needed for sleep.  30 tablet  0  . cyclobenzaprine (FLEXERIL) 10 MG tablet Take 1 tablet (10 mg total) by mouth 3 (three) times daily as needed for muscle spasms.  30 tablet  0  . doxycycline (VIBRA-TABS) 100 MG tablet Take 1 tablet (100 mg total) by mouth 2 (two) times daily.  14 tablet  0  . traMADol (ULTRAM) 50 MG tablet Take 1 tablet (50 mg total) by mouth every 8 (eight) hours as needed for pain.  45 tablet  0  . zolpidem (AMBIEN) 10 MG tablet Take 1 tablet (10 mg total) by mouth at bedtime as needed for sleep.  30 tablet  0   No current facility-administered medications for this visit.    Exam: BP 117/78  Pulse 72  Temp(Src) 98.3 F (36.8 C) (Oral)  Ht 5\' 9"  (1.753 m)  Wt 149 lb 8 oz (67.813 kg)  BMI 22.07  kg/m2 Gen: Well NAD HEENT: EOMI,  MMM SKin: Warm well perfused, intact, no rash MSK: large 2.5-3cm soft cystic bulge in the popliteal fossa. Non-ttp . preproducible pain in LE w/ compression, but cyst itself non painful. Nonerythematous. No induration. No effusion of L knee. FROM. Lochman's, McMurray's, Valgus, Varrus stresses w/o pain. Neuro: CN 2-12 grossly intact. Moves all extremities spontaneously Res: Nml WOB CV: 2+ peripheral pulses.   No results found for this or any previous visit (from the past 72 hour(s)).  Verbal Consent obtained and verified. Sterilealcohol prep.  Topical analgesic spray: Ethyl chloride. adn 1% lidocaine 1cc into the skin and joint space Joint: Approached in typical fashion with: w/ lateral approach Completed without difficulty Meds: 3cc 2% lidocaine, 1cc Depomedrol 40mg  Needle: initial attempt w/ 18g w/o successful aspiration. 25g 1.5inch needle w/ complete access to the joint space.  Aftercare instructions and Red flags advised. No change in size of cyst after procedure

## 2012-12-30 NOTE — Patient Instructions (Addendum)
Thank you for coming in today You have a swollen baker's cyst. The ultimate treatement for this is excision by a surgeon Please use Ibuprofen/Advil/Motrin 600mg  4 times a day or Aleve twice a day for the pain Take these medicines every day for 2 weeks.  If your symptoms become significantly worse or if you develop fever or a swollen joint please come back for evaluation.  Baker's Cyst A Baker's cyst is a swelling that forms in the back of the knee. It is a sac-like structure. It is filled with the same fluid that is located in your knee. The fluid located in your knee is necessary because it lubricates the bones and cartilage. It allows them to move over each other more easily. CAUSES  When the knee becomes injured or has soreness (inflammation) present, more fluid forms in the knee. When this happens, the joint lining is pushed out behind the knee and forms the baker's cyst. This cyst may also be caused by inflammation from arthritic conditions and infections. DIAGNOSIS  A Baker's cyst is most often diagnosed with an ultrasound. This is a specialized picture (like an X-ray). It shows a picture by using sound waves. Sometimes a specialized x-ray called an MRI (magnetic resonance imaging) is used. This picks up other problems within a joint if an ultrasound alone cannot make the diagnosis. If the cyst came immediately following an injury, plain x-rays may be used to make a diagnosis. TREATMENT  The treatment depends on the cause of the cyst. But most of these cysts are caused by an inflammation. Anti-inflammatory medications and rest often will get rid of the problem. If the cyst is caused by an infection, medications (antibiotics) will be prescribed to help this. Take the medications as directed. Refer to Home Care Instructions, below, for additional treatment suggestions. HOME CARE INSTRUCTIONS   If the cyst was caused by an injury, for the first 24 hours, while lying down, keep the injured  extremity elevated on 2 pillows.  For the first 24 hours while you are awake, apply ice bags (ice in a plastic bag with a towel around it to prevent frostbite to skin) 3 to 4 times per day for 15 to 20 minutes to the injured area. Then do as directed by your caregiver.  Only take over-the-counter or prescription medicines for pain, discomfort, or fever as directed by your caregiver. Persistent pain and inability to use the injured area for more than 2 to 3 days are warning signs indicating that you should see a caregiver for a follow-up visit as soon as possible. Persistent pain and swelling indicate that further evaluation, non-weight bearing (use of crutches as instructed), and/or further x-rays are needed. Make a follow-up appointment with your own caregiver. If conservative measures (rest, medications and inactivity) do not help the problem get better, sometimes surgery for removal of the cyst is needed. Reasons for this may be that the cyst is pressing on nerves and/or vessels and causing problems which cannot wait for improvement with conservative treatment. If the problem is caused by injuries to the cartilage in the knee, surgery is often needed for treatment of that problem. MAKE SURE YOU:   Understand these instructions.  Will watch your condition.  Will get help right away if you are not doing well or get worse. Document Released: 10/13/2005 Document Revised: 01/05/2012 Document Reviewed: 05/31/2008 [facility] Patient Information 2013 [place], [place].

## 2013-01-04 NOTE — Telephone Encounter (Signed)
Note to close enc. Gail Gonzalez, Gail Gonzalez  

## 2013-01-04 NOTE — Telephone Encounter (Signed)
Referral has been put into  GI workqueue patient notified

## 2013-01-17 ENCOUNTER — Other Ambulatory Visit: Payer: Self-pay | Admitting: Obstetrics and Gynecology

## 2013-01-18 LAB — GC/CHLAMYDIA PROBE AMP: GC Probe RNA: NEGATIVE

## 2013-01-24 ENCOUNTER — Encounter: Payer: Self-pay | Admitting: Internal Medicine

## 2013-02-18 ENCOUNTER — Encounter: Payer: Self-pay | Admitting: Internal Medicine

## 2013-02-24 DIAGNOSIS — K5790 Diverticulosis of intestine, part unspecified, without perforation or abscess without bleeding: Secondary | ICD-10-CM

## 2013-02-24 HISTORY — DX: Diverticulosis of intestine, part unspecified, without perforation or abscess without bleeding: K57.90

## 2013-03-01 ENCOUNTER — Ambulatory Visit (AMBULATORY_SURGERY_CENTER): Payer: Medicaid Other | Admitting: *Deleted

## 2013-03-01 VITALS — Ht 69.0 in | Wt 146.0 lb

## 2013-03-01 DIAGNOSIS — Z1211 Encounter for screening for malignant neoplasm of colon: Secondary | ICD-10-CM

## 2013-03-01 MED ORDER — MOVIPREP 100 G PO SOLR: ORAL | Status: DC

## 2013-03-24 ENCOUNTER — Encounter: Payer: Self-pay | Admitting: Internal Medicine

## 2013-03-24 ENCOUNTER — Ambulatory Visit (AMBULATORY_SURGERY_CENTER): Payer: Medicaid Other | Admitting: Internal Medicine

## 2013-03-24 VITALS — BP 128/73 | HR 50 | Temp 97.7°F | Resp 23 | Ht 64.0 in | Wt 146.0 lb

## 2013-03-24 DIAGNOSIS — Z1211 Encounter for screening for malignant neoplasm of colon: Secondary | ICD-10-CM

## 2013-03-24 DIAGNOSIS — K649 Unspecified hemorrhoids: Secondary | ICD-10-CM

## 2013-03-24 DIAGNOSIS — D126 Benign neoplasm of colon, unspecified: Secondary | ICD-10-CM

## 2013-03-24 MED ORDER — SODIUM CHLORIDE 0.9 % IV SOLN: 500.0000 mL | INTRAVENOUS | Status: DC

## 2013-03-24 MED ORDER — PRAMOXINE-HC 1-1 % EX CREA: TOPICAL_CREAM | Freq: Two times a day (BID) | CUTANEOUS | Status: DC | PRN

## 2013-03-24 NOTE — Op Note (Signed)
[facility] [address] [place], [address]   COLONOSCOPY PROCEDURE REPORT  PATIENT: Gail Gonzalez, Gail Gonzalez  MR#: [id number] BIRTHDATE: 08-Jan-1961 , 51  yrs. old GENDER: Female ENDOSCOPIST: Beverley Fiedler, MD REFERRED QM:VHQI Cristal Ford, M.D. PROCEDURE DATE:  03/24/2013 PROCEDURE:   Colonoscopy with cold biopsy polypectomy ASA CLASS:   Class II INDICATIONS:average risk screening and first colonoscopy. MEDICATIONS: MAC sedation, administered by CRNA and propofol (Diprivan) 300mg  IV  DESCRIPTION OF PROCEDURE:   After the risks benefits and alternatives of the procedure were thoroughly explained, informed consent was obtained.  A digital rectal exam revealed external hemorrhoids and no rectal masses.   The [id number] endoscope was introduced through the anus and advanced to the cecum, which was identified by both the appendix and ileocecal valve. No adverse events experienced.   The quality of the prep was good, using MoviPrep  The instrument was then slowly withdrawn as the colon was fully examined.     COLON FINDINGS: A sessile polyp measuring 3 mm in size was found in the sigmoid colon.  A polypectomy was performed with cold forceps. The resection was complete and the polyp tissue was completely retrieved.   Mild diverticulosis was noted in the sigmoid colon. Otherwise normal colon. Moderate sized internal and external hemorrhoids were found.  Retroflexed views revealed internal/external hemorrhoids. The time to cecum=5 minutes 02 seconds.  Withdrawal time=11 minutes 42 seconds.  The scope was withdrawn and the procedure completed.  COMPLICATIONS: There were no complications.  ENDOSCOPIC IMPRESSION: 1.   Sessile polyp measuring 3 mm in size was found in the sigmoid colon; polypectomy was performed with cold forceps 2.   Mild diverticulosis was noted in the sigmoid colon 3.   Moderate sized internal and external hemorrhoids  RECOMMENDATIONS: 1.  Await  pathology results 2.  High fiber diet 3.  If the polyp removed today is proven to be an adenomatous (pre-cancerous) polyp, you will need a repeat colonoscopy in 5 years.  Otherwise you should continue to follow colorectal cancer screening guidelines for "routine risk" patients with colonoscopy in 10 years.  You will receive a letter within 1-2 weeks with the results of your biopsy as well as final recommendations.  Please call my office if you have not received a letter after 3 weeks.   eSigned:  Beverley Fiedler, MD 03/24/2013 1:59 PM   cc: The Patient

## 2013-03-24 NOTE — Progress Notes (Signed)
Called to room to assist during endoscopic procedure.  Patient ID and intended procedure confirmed with present staff. Received instructions for my participation in the procedure from the performing physician. ewm 

## 2013-03-24 NOTE — Progress Notes (Signed)
Pt stable to RR 

## 2013-03-24 NOTE — Patient Instructions (Addendum)
YOU HAD AN ENDOSCOPIC PROCEDURE TODAY AT THE [facility]: Refer to the procedure report that was given to you for any specific questions about what was found during the examination.  If the procedure report does not answer your questions, please call your gastroenterologist to clarify.  If you requested that your care partner not be given the details of your procedure findings, then the procedure report has been included in a sealed envelope for you to review at your convenience later.  YOU SHOULD EXPECT: Some feelings of bloating in the abdomen. Passage of more gas than usual.  Walking can help get rid of the air that was put into your GI tract during the procedure and reduce the bloating. If you had a lower endoscopy (such as a colonoscopy or flexible sigmoidoscopy) you may notice spotting of blood in your stool or on the toilet paper. If you underwent a bowel prep for your procedure, then you may not have a normal bowel movement for a few days.  DIET: Your first meal following the procedure should be a light meal and then it is ok to progress to your normal diet.  A half-sandwich or bowl of soup is an example of a good first meal.  Heavy or fried foods are harder to digest and may make you feel nauseous or bloated.  Likewise meals heavy in dairy and vegetables can cause extra gas to form and this can also increase the bloating.  Drink plenty of fluids but you should avoid alcoholic beverages for 24 hours. TRY TO EAT A HIGH FIBER DIET TO PREVENT DIVERTICULITIS AND EASE YOUR HEMORRHOIDS.  ACTIVITY: Your care partner should take you home directly after the procedure.  You should plan to take it easy, moving slowly for the rest of the day.  You can resume normal activity the day after the procedure however you should NOT DRIVE or use heavy machinery for 24 hours (because of the sedation medicines used during the test).    SYMPTOMS TO REPORT IMMEDIATELY: A gastroenterologist can be reached at  any hour.  During normal business hours, 8:30 AM to 5:00 PM Monday through Friday, call [phone].  After hours and on weekends, please call the GI answering service at [phone] who will take a message and have the physician on call contact you.   Following lower endoscopy (colonoscopy or flexible sigmoidoscopy):  Excessive amounts of blood in the stool  Significant tenderness or worsening of abdominal pains  Swelling of the abdomen that is new, acute  Fever of 100F or higher   FOLLOW UP: If any biopsies were taken you will be contacted by phone or by letter within the next 1-3 weeks.  Call your gastroenterologist if you have not heard about the biopsies in 3 weeks.  Our staff will call the home number listed on your records the next business day following your procedure to check on you and address any questions or concerns that you may have at that time regarding the information given to you following your procedure. This is a courtesy call and so if there is no answer at the home number and we have not heard from you through the emergency physician on call, we will assume that you have returned to your regular daily activities without incident.  SIGNATURES/CONFIDENTIALITY: You and/or your care partner have signed paperwork which will be entered into your electronic medical record.  These signatures attest to the fact that that the information above on your After Visit  Summary has been reviewed and is understood.  Full responsibility of the confidentiality of this discharge information lies with you and/or your care-partner.  USE YOUR ANALPRAM AS DIRECTED BY DR. PYRTLE.   YOUR APPOINTMENT WITH DR. PYRTLE IS June 20TH AT 2PM.

## 2013-03-24 NOTE — Progress Notes (Addendum)
Patient did not have preoperative order for IV antibiotic SSI prophylaxis. (G8918)  Patient did not experience any of the following events: a burn prior to discharge; a fall within the facility; wrong site/side/patient/procedure/implant event; or a hospital transfer or hospital admission upon discharge from the facility. (G8907)  

## 2013-03-25 ENCOUNTER — Telehealth: Payer: Self-pay | Admitting: *Deleted

## 2013-03-25 NOTE — Telephone Encounter (Signed)
Left message that we called for f/u 

## 2013-03-30 ENCOUNTER — Encounter: Payer: Self-pay | Admitting: Internal Medicine

## 2013-04-12 ENCOUNTER — Encounter: Payer: Self-pay | Admitting: Internal Medicine

## 2013-04-15 ENCOUNTER — Ambulatory Visit (INDEPENDENT_AMBULATORY_CARE_PROVIDER_SITE_OTHER): Payer: Medicaid Other | Admitting: Internal Medicine

## 2013-04-15 ENCOUNTER — Encounter: Payer: Self-pay | Admitting: Internal Medicine

## 2013-04-15 VITALS — BP 118/70 | HR 80 | Ht 69.0 in | Wt 144.1 lb

## 2013-04-15 DIAGNOSIS — K219 Gastro-esophageal reflux disease without esophagitis: Secondary | ICD-10-CM

## 2013-04-15 DIAGNOSIS — R1310 Dysphagia, unspecified: Secondary | ICD-10-CM

## 2013-04-15 DIAGNOSIS — R1013 Epigastric pain: Secondary | ICD-10-CM

## 2013-04-15 MED ORDER — PANTOPRAZOLE SODIUM 40 MG PO TBEC: 40.0000 mg | DELAYED_RELEASE_TABLET | Freq: Every day | ORAL | Status: DC

## 2013-04-15 NOTE — Patient Instructions (Addendum)
You have been scheduled for an endoscopy with propofol. Please follow written instructions given to you at your visit today. If you use inhalers (even only as needed), please bring them with you on the day of your procedure. Your physician has requested that you go to www.startemmi.com and enter the access code given to you at your visit today. This web site gives a general overview about your procedure. However, you should still follow specific instructions given to you by our office regarding your preparation for the procedure.  We have sent the following medications to your pharmacy for you to pick up at your convenience: Protonix                                               We are excited to introduce MyChart, a new best-in-class service that provides you online access to important information in your electronic medical record. We want to make it easier for you to view your health information - all in one secure location - when and where you need it. We expect MyChart will enhance the quality of care and service we provide.  When you register for MyChart, you can:    View your test results.    Request appointments and receive appointment reminders via email.    Request medication renewals.    View your medical history, allergies, medications and immunizations.    Communicate with your physician's office through a password-protected site.    Conveniently print information such as your medication lists.  To find out if MyChart is right for you, please talk to a member of our clinical staff today. We will gladly answer your questions about this free health and wellness tool.  If you are age 52 or older and want a member of your family to have access to your record, you must provide written consent by completing a proxy form available at our office. Please speak to our clinical staff about guidelines regarding accounts for patients younger than age 5.  As you activate your MyChart  account and need any technical assistance, please call the MyChart technical support line at [phone]) or email your question to [email or web address]. If you email your question(s), please include your name, a return phone number and the best time to reach you.  If you have non-urgent health-related questions, you can send a message to our office through MyChart at [email or web address]. If you have a medical emergency, call 911.  Thank you for using MyChart as your new health and wellness resource!   MyChart licensed from [employer],  4540-9811. Patents Pending.
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
## 2013-04-21 ENCOUNTER — Ambulatory Visit (AMBULATORY_SURGERY_CENTER): Payer: Medicaid Other | Admitting: Internal Medicine

## 2013-04-21 ENCOUNTER — Encounter: Payer: Self-pay | Admitting: Internal Medicine

## 2013-04-21 VITALS — BP 132/80 | HR 57 | Temp 97.9°F | Resp 16 | Ht 69.0 in | Wt 144.0 lb

## 2013-04-21 DIAGNOSIS — K3189 Other diseases of stomach and duodenum: Secondary | ICD-10-CM

## 2013-04-21 DIAGNOSIS — R1310 Dysphagia, unspecified: Secondary | ICD-10-CM

## 2013-04-21 DIAGNOSIS — K219 Gastro-esophageal reflux disease without esophagitis: Secondary | ICD-10-CM

## 2013-04-21 DIAGNOSIS — K269 Duodenal ulcer, unspecified as acute or chronic, without hemorrhage or perforation: Secondary | ICD-10-CM

## 2013-04-21 MED ORDER — PANTOPRAZOLE SODIUM 40 MG PO TBEC: 40.0000 mg | DELAYED_RELEASE_TABLET | Freq: Two times a day (BID) | ORAL | Status: DC

## 2013-04-21 MED ORDER — SODIUM CHLORIDE 0.9 % IV SOLN: 500.0000 mL | INTRAVENOUS | Status: DC
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
## 2013-04-21 NOTE — Progress Notes (Signed)
Report to pacu rn, vss, bbs=clear, chin lift, jaw thrust given for larengospasm, broke immediately moving air ventilating well sx, clear airway

## 2013-04-21 NOTE — Op Note (Signed)
[facility] [address] [place], [address]   ENDOSCOPY PROCEDURE REPORT  PATIENT: Gail, Gonzalez  MR#: [id number] BIRTHDATE: 11/20/1960 , 51  yrs. old GENDER: Female ENDOSCOPIST: Beverley Fiedler, MD REFERRED BY:  Lloyd Huger, M.D. PROCEDURE DATE:  04/21/2013 PROCEDURE:  EGD w/ biopsy ASA CLASS:     Class II INDICATIONS:  Heartburn.   Dyspepsia.   Dysphagia. MEDICATIONS: MAC sedation, administered by CRNA and propofol (Diprivan) 150mg  IV TOPICAL ANESTHETIC: none  DESCRIPTION OF PROCEDURE: After the risks benefits and alternatives of the procedure were thoroughly explained, informed consent was obtained.  The LB [id number] endoscope was introduced through the mouth and advanced to the second portion of the duodenum. Without limitations.  The instrument was slowly withdrawn as the mucosa was fully examined.   ESOPHAGUS: A nonobstructing Schatzki ring was found at the gastroesophageal junction.  Multiple biopsies were performed using cold forceps to disrupt the ring.   The esophagus was otherwise normal.  STOMACH: Mild gastropathy was found in the entire examined stomach. There were erosions present.   Multiple biopsies were taken from the gastric body and antrum to exclude H. pylori  DUODENUM: A single non-bleeding clean-based ulcer, measuring 3 x 5mm in size, was found in the duodenal bulb.   The duodenal mucosa showed no abnormalities in the 2nd part of the duodenum.  Retroflexed views revealed a small 2-3 cm hiatal hernia. The scope was then withdrawn from the patient and the procedure completed.  COMPLICATIONS: There were no complications.  ENDOSCOPIC IMPRESSION: 1.   Nonobstructing Schatzki ring was found at the gastroesophageal junction; multiple biopsies for ring disruption 2.   The esophagus was otherwise normal. 3.   Gastropathy was found in the entire examined stomach; multiple biopsies 4.   Single non-bleeding ulcer, measuring 3 x 5mm  in size, was found in the duodenal bulb 5.   The duodenal mucosa showed no abnormalities in the 2nd part of the duodenum     RECOMMENDATIONS: 1.  Await pathology results 2.  avoid NSAIDS 3.  Follow-up of helicobacter pylori status, treat if indicated 4.  Increased pantoprazole to 40 mg twice daily for 8 weeks, then decrease to 40 mg once daily thereafter 5.  Office followup in about 6 weeks  eSigned:  Beverley Fiedler, MD 04/21/2013 3:56 PM   CC:The Patient;  Lloyd Huger, M.D.  PATIENT NAME:  Gail, Gonzalez MR#: [id number]

## 2013-04-21 NOTE — Progress Notes (Signed)
Called to room to assist during endoscopic procedure.  Patient ID and intended procedure confirmed with present staff. Received instructions for my participation in the procedure from the performing physician. ewm 

## 2013-04-21 NOTE — Patient Instructions (Addendum)

## 2013-04-22 ENCOUNTER — Telehealth: Payer: Self-pay

## 2013-04-22 NOTE — Telephone Encounter (Signed)
  Follow up Call-  Call back number 04/21/2013 03/24/2013  Post procedure Call Back phone  # [phone] cell [phone]  Permission to leave phone message Yes Yes     Patient questions:  Do you have a fever, pain , or abdominal swelling? no Pain Score  0 *  Have you tolerated food without any problems? yes  Have you been able to return to your normal activities? yes  Do you have any questions about your discharge instructions: Diet   no Medications  no Follow up visit  no  Do you have questions or concerns about your Care? no  Actions: * If pain score is 4 or above: No action needed, pain <4.

## 2013-04-24 DIAGNOSIS — K635 Polyp of colon: Secondary | ICD-10-CM

## 2013-04-24 HISTORY — DX: Polyp of colon: K63.5

## 2013-04-26 ENCOUNTER — Other Ambulatory Visit: Payer: Self-pay | Admitting: *Deleted

## 2013-04-26 ENCOUNTER — Encounter: Payer: Self-pay | Admitting: Internal Medicine

## 2013-04-27 ENCOUNTER — Other Ambulatory Visit: Payer: Self-pay | Admitting: *Deleted

## 2013-04-27 MED ORDER — BIS SUBCIT-METRONID-TETRACYC 140-125-125 MG PO CAPS: 3.0000 | ORAL_CAPSULE | Freq: Three times a day (TID) | ORAL | Status: DC

## 2013-05-03 ENCOUNTER — Telehealth: Payer: Self-pay | Admitting: Internal Medicine

## 2013-05-03 NOTE — Telephone Encounter (Signed)
Called [facility] out pt pharmacy to ask about interactions. Arlys John Pharmacist checked on  Pylera and Dayquil and found no interactions. Arlys John stated to be safe, maybe take the drugs at different times. Pt stated understanding.

## 2013-05-09 ENCOUNTER — Telehealth: Payer: Self-pay | Admitting: Internal Medicine

## 2013-05-09 ENCOUNTER — Telehealth: Payer: Self-pay | Admitting: Gastroenterology

## 2013-05-09 NOTE — Telephone Encounter (Signed)
lvm for pt. Told her she is to take protonix 1 tablet by mouth twice a day, once 30 min before her first meal of the day then next one 30 minutes before her last meal. If she has anymore questions to call me back.

## 2013-05-09 NOTE — Telephone Encounter (Signed)
Pt called back, did not want to know about ulcer medication, but wanted instrcutions about Pylera.  Told her take 3 capsules (4 times a day) before each meal and before bedtime. Pt verbalized understanding

## 2013-05-09 NOTE — Telephone Encounter (Signed)
Calling again cause she has not heard back

## 2013-06-03 ENCOUNTER — Encounter: Payer: Self-pay | Admitting: Internal Medicine

## 2013-06-07 ENCOUNTER — Ambulatory Visit: Payer: Medicaid Other | Admitting: Internal Medicine

## 2013-06-15 ENCOUNTER — Telehealth: Payer: Self-pay | Admitting: Internal Medicine

## 2013-06-15 ENCOUNTER — Other Ambulatory Visit: Payer: Self-pay | Admitting: Gastroenterology

## 2013-06-15 MED ORDER — OMEPRAZOLE 40 MG PO CPDR: 40.0000 mg | DELAYED_RELEASE_CAPSULE | Freq: Two times a day (BID) | ORAL | Status: DC

## 2013-06-15 NOTE — Telephone Encounter (Signed)
per Dr. Margarita Sermons in omeprazole 40 mg bid

## 2014-05-22 ENCOUNTER — Other Ambulatory Visit: Payer: Self-pay | Admitting: Internal Medicine

## 2014-05-22 DIAGNOSIS — E042 Nontoxic multinodular goiter: Secondary | ICD-10-CM

## 2014-05-23 ENCOUNTER — Ambulatory Visit
Admission: RE | Admit: 2014-05-23 | Discharge: 2014-05-23 | Disposition: A | Discharge status: Home / Self Care | Payer: BC Managed Care – PPO | Source: Ambulatory Visit | Attending: Internal Medicine | Admitting: Internal Medicine

## 2014-05-23 DIAGNOSIS — E042 Nontoxic multinodular goiter: Secondary | ICD-10-CM

## 2014-07-10 ENCOUNTER — Other Ambulatory Visit: Payer: Self-pay | Admitting: Obstetrics and Gynecology

## 2014-07-10 DIAGNOSIS — D241 Benign neoplasm of right breast: Secondary | ICD-10-CM

## 2014-07-21 ENCOUNTER — Ambulatory Visit: Payer: BC Managed Care – PPO | Admitting: Family Medicine

## 2014-08-01 ENCOUNTER — Ambulatory Visit
Admission: RE | Admit: 2014-08-01 | Discharge: 2014-08-01 | Disposition: A | Discharge status: Home / Self Care | Payer: BC Managed Care – PPO | Source: Ambulatory Visit | Attending: Obstetrics and Gynecology | Admitting: Obstetrics and Gynecology

## 2014-08-01 ENCOUNTER — Encounter (INDEPENDENT_AMBULATORY_CARE_PROVIDER_SITE_OTHER): Payer: Self-pay

## 2014-08-01 DIAGNOSIS — D241 Benign neoplasm of right breast: Secondary | ICD-10-CM

## 2014-08-17 ENCOUNTER — Ambulatory Visit (INDEPENDENT_AMBULATORY_CARE_PROVIDER_SITE_OTHER): Payer: BC Managed Care – PPO | Admitting: Family Medicine

## 2014-08-17 ENCOUNTER — Encounter: Payer: Self-pay | Admitting: Family Medicine

## 2014-08-17 VITALS — BP 101/68 | HR 92 | Temp 98.1°F | Resp 18 | Wt 141.0 lb

## 2014-08-17 DIAGNOSIS — E781 Pure hyperglyceridemia: Secondary | ICD-10-CM

## 2014-08-17 DIAGNOSIS — D751 Secondary polycythemia: Secondary | ICD-10-CM | POA: Diagnosis not present

## 2014-08-17 DIAGNOSIS — J0100 Acute maxillary sinusitis, unspecified: Secondary | ICD-10-CM | POA: Diagnosis not present

## 2014-08-17 DIAGNOSIS — M7122 Synovial cyst of popliteal space [Baker], left knee: Secondary | ICD-10-CM

## 2014-08-17 DIAGNOSIS — G5622 Lesion of ulnar nerve, left upper limb: Secondary | ICD-10-CM

## 2014-08-17 MED ORDER — DOXYCYCLINE HYCLATE 100 MG PO TABS: 100.0000 mg | ORAL_TABLET | Freq: Two times a day (BID) | ORAL | Status: DC

## 2014-08-17 NOTE — Progress Notes (Signed)
Patient ID: Gail Gonzalez, female   DOB: 1960/12/25, 53 y.o.   MRN: [id number]   Subjective:  Gail Gonzalez is a 53 y.o. female here for URI symptoms.  She has a history of sinus infections and reports familiar symptoms of congestion facial pain intermittent rhinorrhea and occasional dry cough. Symptoms x14 days gradual onset and worsening. + fever, HA. No myalgias.   Also complains of consistent pain due to a baker's cyst. This has been attempted to be aspirated multiple times without success. Corticosteroid injections have been ineffective. It is worse when she is standing and walking.   All other pertinent systems reviewed and are negative. Objective:  BP 101/68  Pulse 92  Temp(Src) 98.1 F (36.7 C) (Oral)  Resp 18  Wt 141 lb (63.957 kg)  SpO2 96%  Gen:  53 y.o. female in NAD HEENT: MMM, EOMI, PERRL, anicteric sclerae, pain over maxillary sinuses.  CV: RRR, no MRG, no JVD Resp: Non-labored, CTAB, some rhonchi Abd: Soft, NTND, BS present, no guarding or organomegaly MSK: Baker's cyst not limiting ROM but elicits nocifensive response on full extension worse with walking. No edema noted. Calf nontender without erythema.  Neuro: Alert and oriented, speech normal Assessment & Plan:  Gail Gonzalez is a 53 y.o. female with:  Problem List Items Addressed This Visit     Respiratory   Acute sinusitis     Prolonged duration with constitutional symptoms. Rx abx and supportive care.     Relevant Medications      DOXYCYCLINE HYCLATE 100 MG PO TABS     Musculoskeletal and Integument   Baker's cyst, unruptured   Relevant Orders      Ambulatory referral to General Surgery    Other Visit Diagnoses   Hypertriglyceridemia    -  Primary    Relevant Orders       Lipid Panel (Completed)    Polycythemia, secondary        Relevant Orders       CBC (Completed)

## 2014-08-17 NOTE — Patient Instructions (Signed)
TAKE doxycycline (an antibiotic) for your sinus infection. You must take the entire prescription (twice daily for 7 days). I have referred you to general surgery for your baker's cyst.  Make sure to make an appointment for fasting labwork sometime soon.

## 2014-08-22 ENCOUNTER — Other Ambulatory Visit: Payer: BC Managed Care – PPO

## 2014-08-22 DIAGNOSIS — D751 Secondary polycythemia: Secondary | ICD-10-CM

## 2014-08-22 DIAGNOSIS — E781 Pure hyperglyceridemia: Secondary | ICD-10-CM

## 2014-08-22 LAB — LIPID PANEL
CHOL/HDL RATIO: 3 ratio
Cholesterol: 165 mg/dL (ref 0–200)
HDL: 55 mg/dL (ref >39)
LDL CALC: 87 mg/dL (ref 0–99)
TRIGLYCERIDES: 117 mg/dL (ref <150)
VLDL: 23 mg/dL (ref 0–40)

## 2014-08-22 LAB — CBC
HEMATOCRIT: 45.6 % (ref 36.0–46.0)
HEMOGLOBIN: 15.7 g/dL — AB (ref 12.0–15.0)
MCH: 33.8 pg (ref 26.0–34.0)
MCHC: 34.4 g/dL (ref 30.0–36.0)
MCV: 98.3 fL (ref 78.0–100.0)
Platelets: 297 10*3/uL (ref 150–400)
RBC: 4.64 MIL/uL (ref 3.87–5.11)
RDW: 14.2 % (ref 11.5–15.5)
WBC: 6.1 10*3/uL (ref 4.0–10.5)

## 2014-08-22 NOTE — Progress Notes (Signed)
CBC AND FLP DONE TODAY Gail Gonzalez 

## 2014-08-23 NOTE — Assessment & Plan Note (Signed)
Prolonged duration with constitutional symptoms. Rx abx and supportive care.

## 2014-08-28 ENCOUNTER — Encounter: Payer: Self-pay | Admitting: Family Medicine

## 2014-09-14 ENCOUNTER — Telehealth: Payer: Self-pay | Admitting: Family Medicine

## 2014-09-14 NOTE — Telephone Encounter (Signed)
Pt called and would like the results of her recent lab work. She said that if she didn't answer you can leave a detailed message on her voice mail. jw

## 2014-09-16 NOTE — Telephone Encounter (Signed)
Her only abnormality was a hemoglobin that was actually HIGH. She did not have any abnormality with white blood cells or platelets. Her cholesterol looks good. Could you please communicate this to her? Thank you!

## 2014-09-18 NOTE — Telephone Encounter (Signed)
Called patient to give results and no answer. Pt just needs her results from Dr. Bonner Puna.   Thanks, [employer]

## 2014-10-02 ENCOUNTER — Telehealth: Payer: Self-pay | Admitting: Family Medicine

## 2014-10-02 NOTE — Telephone Encounter (Signed)
Pt called and needs [place] to send or fax her lab results to her OBGYN at [facility] attention Dr. Mancel Bale. jw

## 2014-10-03 NOTE — Telephone Encounter (Signed)
Faxed out lab reults

## 2014-10-04 ENCOUNTER — Telehealth: Payer: Self-pay | Admitting: Family Medicine

## 2014-10-04 NOTE — Telephone Encounter (Signed)
Pt called because [facility] called her and said that they can not see her for her issue. They told her she needs to be seen by a Ortho doctor, can we do another referral to see a Ortho doctor. jw

## 2014-10-13 NOTE — Telephone Encounter (Signed)
Pt called back again--needs the referral to orthopaedic surgeon to remove the cyst on the back of her knee. Please advise

## 2014-11-14 ENCOUNTER — Other Ambulatory Visit: Payer: Self-pay | Admitting: Internal Medicine

## 2014-11-14 DIAGNOSIS — E042 Nontoxic multinodular goiter: Secondary | ICD-10-CM

## 2014-11-21 ENCOUNTER — Ambulatory Visit
Admission: RE | Admit: 2014-11-21 | Discharge: 2014-11-21 | Disposition: A | Discharge status: Home / Self Care | Payer: 59 | Source: Ambulatory Visit | Attending: Internal Medicine | Admitting: Internal Medicine

## 2014-11-21 DIAGNOSIS — E042 Nontoxic multinodular goiter: Secondary | ICD-10-CM

## 2014-11-22 ENCOUNTER — Other Ambulatory Visit: Payer: Self-pay

## 2015-06-11 ENCOUNTER — Encounter: Payer: Self-pay | Admitting: Family Medicine

## 2015-06-11 ENCOUNTER — Ambulatory Visit (INDEPENDENT_AMBULATORY_CARE_PROVIDER_SITE_OTHER): Payer: 59 | Admitting: Family Medicine

## 2015-06-11 VITALS — BP 107/73 | HR 81 | Temp 98.7°F | Wt 138.0 lb

## 2015-06-11 DIAGNOSIS — E559 Vitamin D deficiency, unspecified: Secondary | ICD-10-CM | POA: Diagnosis not present

## 2015-06-11 DIAGNOSIS — M7122 Synovial cyst of popliteal space [Baker], left knee: Secondary | ICD-10-CM

## 2015-06-11 DIAGNOSIS — N809 Endometriosis, unspecified: Secondary | ICD-10-CM | POA: Diagnosis not present

## 2015-06-11 DIAGNOSIS — E041 Nontoxic single thyroid nodule: Secondary | ICD-10-CM | POA: Diagnosis not present

## 2015-06-11 DIAGNOSIS — Z114 Encounter for screening for human immunodeficiency virus [HIV]: Secondary | ICD-10-CM

## 2015-06-11 DIAGNOSIS — I10 Essential (primary) hypertension: Secondary | ICD-10-CM

## 2015-06-11 DIAGNOSIS — K219 Gastro-esophageal reflux disease without esophagitis: Secondary | ICD-10-CM

## 2015-06-11 DIAGNOSIS — G5622 Lesion of ulnar nerve, left upper limb: Secondary | ICD-10-CM | POA: Diagnosis not present

## 2015-06-11 MED ORDER — OMEPRAZOLE 40 MG PO CPDR: 40.0000 mg | DELAYED_RELEASE_CAPSULE | Freq: Every day | ORAL | Status: DC

## 2015-06-11 NOTE — Assessment & Plan Note (Signed)
Recheck today. 

## 2015-06-11 NOTE — Assessment & Plan Note (Addendum)
Requires re-referral to [facility] surgeon who biopsied the nodule. No symptoms. Will defer recheck of TSH.

## 2015-06-11 NOTE — Assessment & Plan Note (Addendum)
Ran out of PPI with recurrence of severe sxs. Restart this today. Will discuss GI referral at next office visit.

## 2015-06-11 NOTE — Assessment & Plan Note (Signed)
Referral made again for evaluation and management. Limits mobility.

## 2015-06-11 NOTE — Assessment & Plan Note (Signed)
Has mirena, followed by Dr. Mancel Bale, requires re-referral. No change in symptoms.

## 2015-06-11 NOTE — Patient Instructions (Signed)
Thank you for coming in today!  We are checking some labs today, and we'll call you if they are abnormal. If you do not hear from me by phone or letter in 2 weeks, please call us as we may have been unable to reach you.   Our clinic's number is [phone]. Feel free to call any time with questions or concerns. We will answer any questions after hours with our 24-hour emergency line at that number as well.   - Dr. Odin Mariani  

## 2015-06-11 NOTE — Progress Notes (Signed)
Subjective: Gail Gonzalez is a 54 y.o. female with a history of vitamin D deficiency, GERD, baker's cyst, thyroid nodule, and endometriosis, and hypothyroidism presenting for medication refills and referral.  Her insurance requires PCP referral for the following.  Desires referral to orthopedics for evaluation of baker's cyst. Symptoms have been stable, limiting her ability to walk and stand for longer durations.  Re-referral to Dr. Mancel Bale, OBGYN, for endometriosis who she has seen for many years. Stable symptoms.  Needs re-referral to Dr. Braulio Conte at [facility] endocrinology for continued nodule surveillance. No heat/cold intolerance, palpitations, fatigue, skin or hair texture changes.   GERD: Stable, ran out of PPI and has been taking nothing reporting severe heartburn symptoms.  Has been taking a multivitamin but no stand alone vitamin D.   - ROS: Denies fever, headache, cough, CP, SOB, abdominal pain, N/V/D, blood in stool, change in bladder habits, myalgias, arthralgias, and rash.   Objective: BP 107/73 mmHg  Pulse 81  Temp(Src) 98.7 F (37.1 C) (Oral)  Wt 138 lb (62.596 kg) Gen: Thin, well-appearing 54 y.o. female in no distress HEENT: MMM, posterior oropharynx clear. Thyroid not palpably enlarged or with nodules.  Pulm: Non-labored; CTAB, no wheezes  CV: Regular rate, no murmur appreciated; distal pulses intact/symmetric GI: + BS; soft, non-tender, non-distended Skin: No rashes, wounds, ulcers Neuro: A&Ox3, CN II-XII without deficits MSK: Palpable fullness/cyst behind left knee on full extension (painful but full ROM). No distal swelling, calf pain.   Assessment/Plan: Gail Gonzalez is a 54 y.o. female here for medication refills and referrals.

## 2015-06-12 LAB — CBC
HEMATOCRIT: 43 % (ref 36.0–46.0)
HEMOGLOBIN: 14.6 g/dL (ref 12.0–15.0)
MCH: 33.6 pg (ref 26.0–34.0)
MCHC: 34 g/dL (ref 30.0–36.0)
MCV: 98.9 fL (ref 78.0–100.0)
MPV: 9.4 fL (ref 8.6–12.4)
Platelets: 301 10*3/uL (ref 150–400)
RBC: 4.35 MIL/uL (ref 3.87–5.11)
RDW: 13.7 % (ref 11.5–15.5)
WBC: 6.9 10*3/uL (ref 4.0–10.5)

## 2015-06-12 LAB — HIV ANTIBODY (ROUTINE TESTING W REFLEX): HIV 1&2 Ab, 4th Generation: NONREACTIVE

## 2015-06-12 LAB — BASIC METABOLIC PANEL
BUN: 12 mg/dL (ref 7–25)
CO2: 26 mmol/L (ref 20–31)
Calcium: 9.3 mg/dL (ref 8.6–10.4)
Chloride: 105 mmol/L (ref 98–110)
Creat: 0.83 mg/dL (ref 0.50–1.05)
Glucose, Bld: 107 mg/dL — ABNORMAL HIGH (ref 65–99)
Potassium: 3.8 mmol/L (ref 3.5–5.3)
SODIUM: 139 mmol/L (ref 135–146)

## 2015-06-12 LAB — HEPATITIS C ANTIBODY: HCV Ab: NEGATIVE

## 2015-06-12 LAB — VITAMIN D 25 HYDROXY (VIT D DEFICIENCY, FRACTURES): Vit D, 25-Hydroxy: 27 ng/mL — ABNORMAL LOW (ref 30–100)

## 2015-06-14 ENCOUNTER — Telehealth: Payer: Self-pay | Admitting: Family Medicine

## 2015-06-14 DIAGNOSIS — M7122 Synovial cyst of popliteal space [Baker], left knee: Secondary | ICD-10-CM

## 2015-06-14 NOTE — Telephone Encounter (Signed)
Need to have a [facility] compass referral for [place] practice to see Dr.  Lillia Corporal for a Baker's cyst to back of left knee.

## 2015-06-15 NOTE — Telephone Encounter (Signed)
Not sure what changes need to be made for [facility] compass referrals, but I've placed a referral to orthopedics. Let me know if I need to change anything.

## 2015-06-15 NOTE — Telephone Encounter (Signed)
Will forward to PCP for review. Please forward back to [facility] [place] when done. Vannah Nadal, CMA.

## 2015-06-15 NOTE — Telephone Encounter (Signed)
[facility] called yesterday and the [place] was taken care. Referral # [id number].  I will cancel the duplicate referral.

## 2015-06-20 ENCOUNTER — Telehealth: Payer: Self-pay | Admitting: Family Medicine

## 2015-06-20 NOTE — Telephone Encounter (Signed)
I placed a referral to endocrinology at the last office visit 8/15, to go see [facility] endocrinologist. Did this go through?

## 2015-06-20 NOTE — Telephone Encounter (Signed)
Patient left a message on the medical records line saying that she needs a referral to thyroid doctor.

## 2015-06-21 NOTE — Telephone Encounter (Signed)
LVM for pt to inform her that her referral had been placed.   (Referral confirmation faxed to [facility] [phone]. Appt 10/7 @ 11:20). Please advise pt if she calls back. Katharina Caper, Jerelle Virden D, [place]

## 2015-06-22 NOTE — Telephone Encounter (Signed)
appt card mailed to patient. Jazmin Hartsell,CMA

## 2015-06-27 ENCOUNTER — Telehealth: Payer: Self-pay | Admitting: Family Medicine

## 2015-06-27 NOTE — Telephone Encounter (Signed)
Pt is requesting [place] to leave a detailed message of the lab results on her vm if she doesn't answer.

## 2015-06-27 NOTE — Telephone Encounter (Signed)
Pt returning call, informed of referral appt that was made. Pt is requesting her lab results, advised someone would call her back.

## 2015-07-06 NOTE — Telephone Encounter (Signed)
Late entry: I left a voicemail indicating normal blood counts, renal function/electrolytes, negative screens for HCV and HIV and advised increasing green leafy vegetables in the diet in addition to taking a daily OTC vitamin D and calcium supplement for her borderline-low vitamin D level.

## 2015-07-21 IMAGING — US US BREAST*R* LIMITED INC AXILLA
1 series · 9 of 9 positions shown · non-contrast
Comparison: 10/04/2012 and earlier

CLINICAL DATA: Delayed followup for probable right breast
fibroadenomas.

EXAM:
DIGITAL DIAGNOSTIC  BILATERAL MAMMOGRAM WITH CAD
ULTRASOUND RIGHT BREAST

[Series 1: us breast*right* limited inc axilla · 9 of 9 slices shown]
[im 1/9]
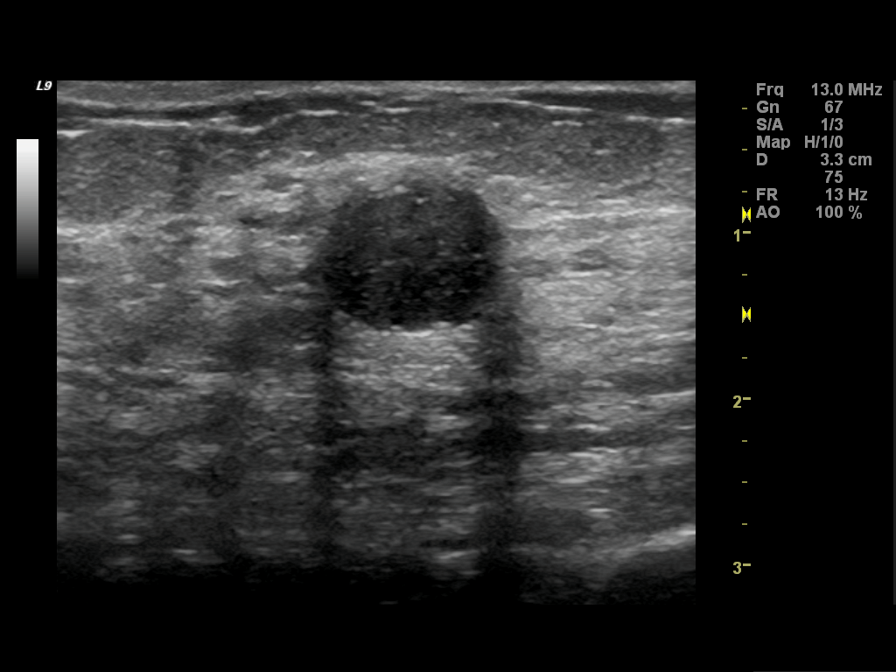
[im 2/9]
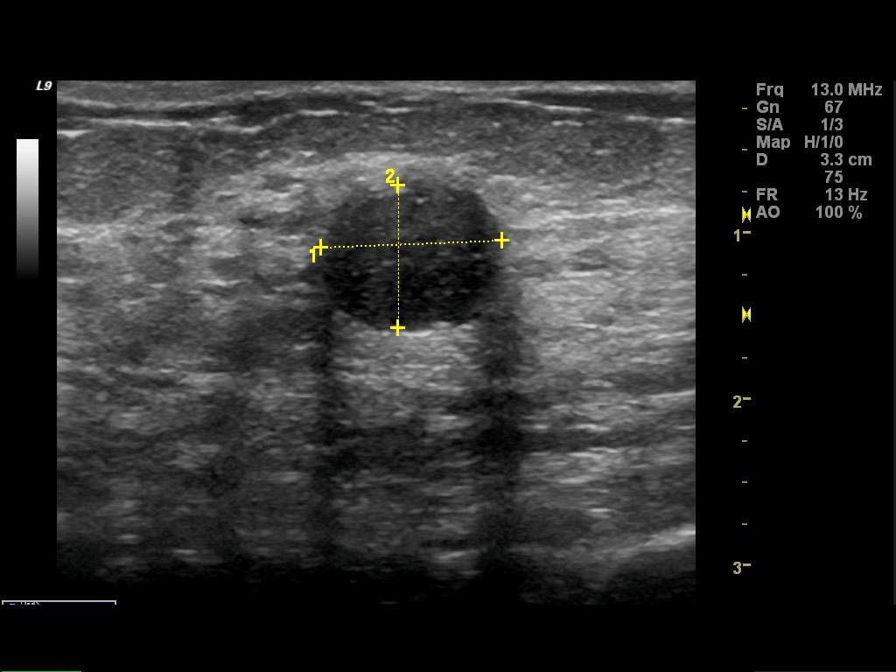
[im 3/9]
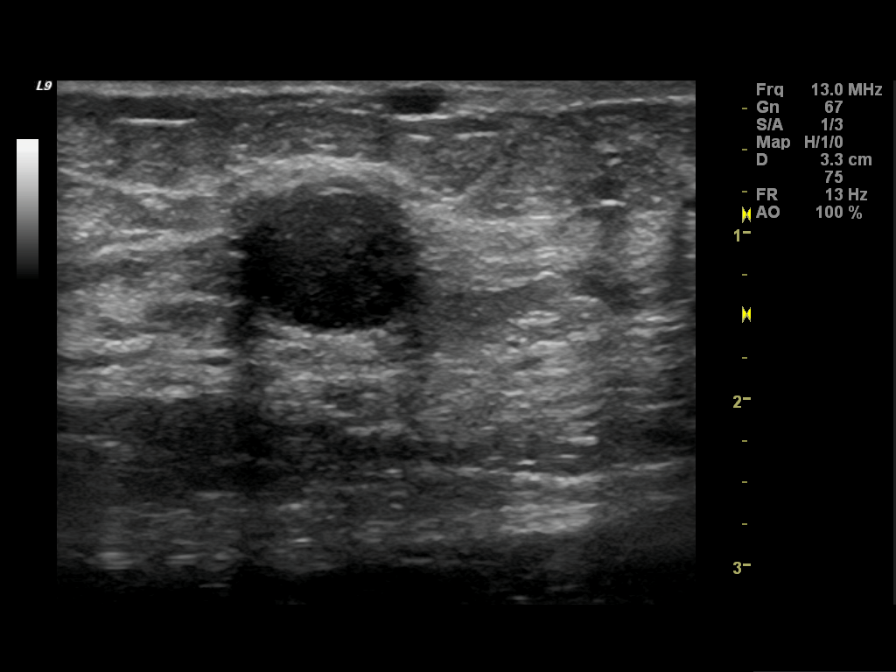
[im 4/9]
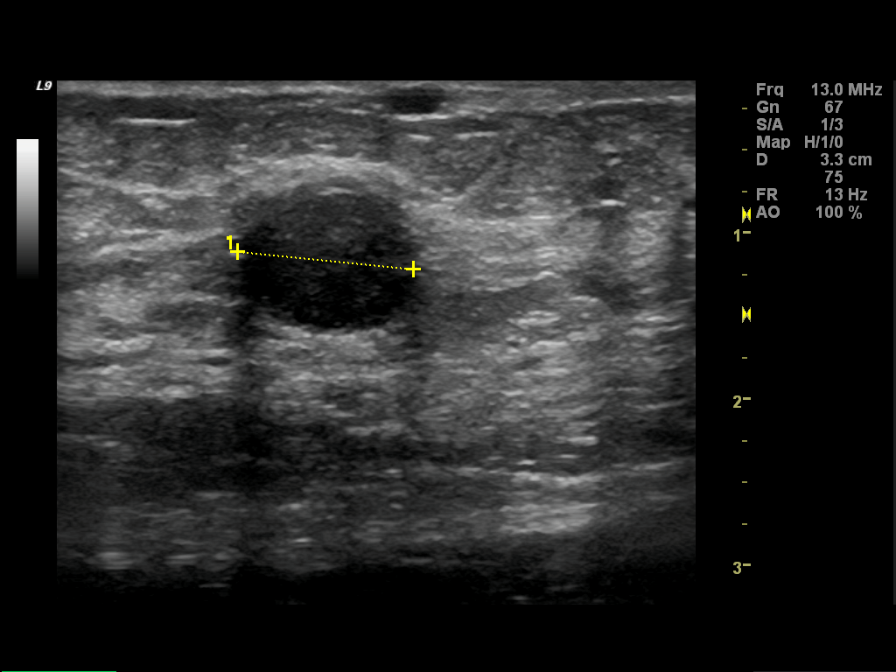
[im 5/9]
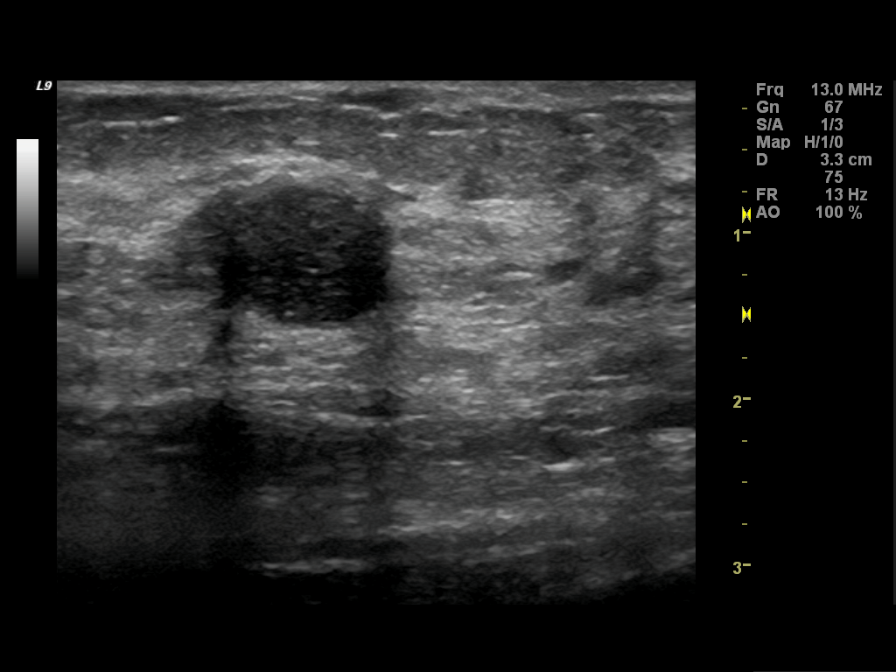
[im 6/9]
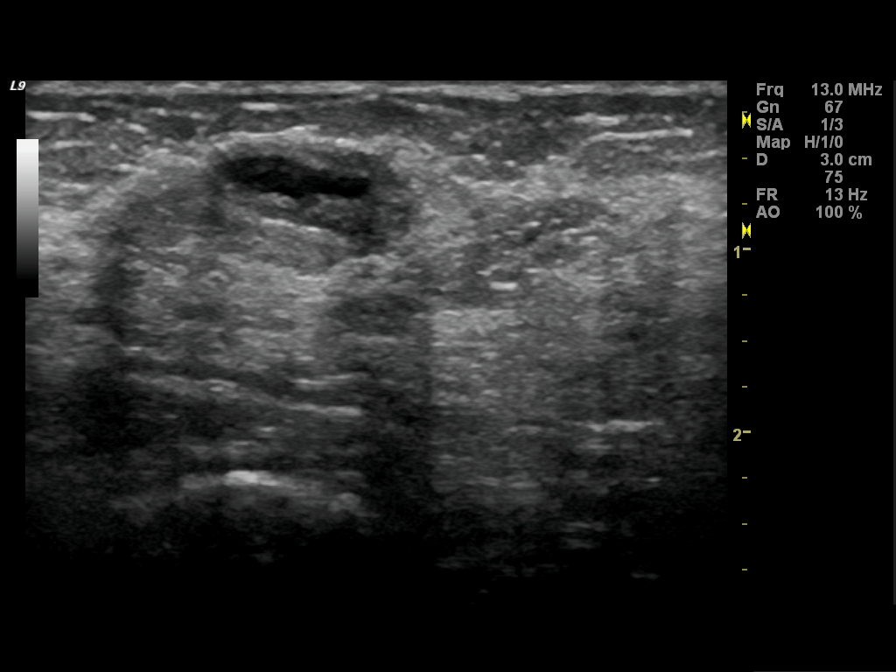
[im 7/9]
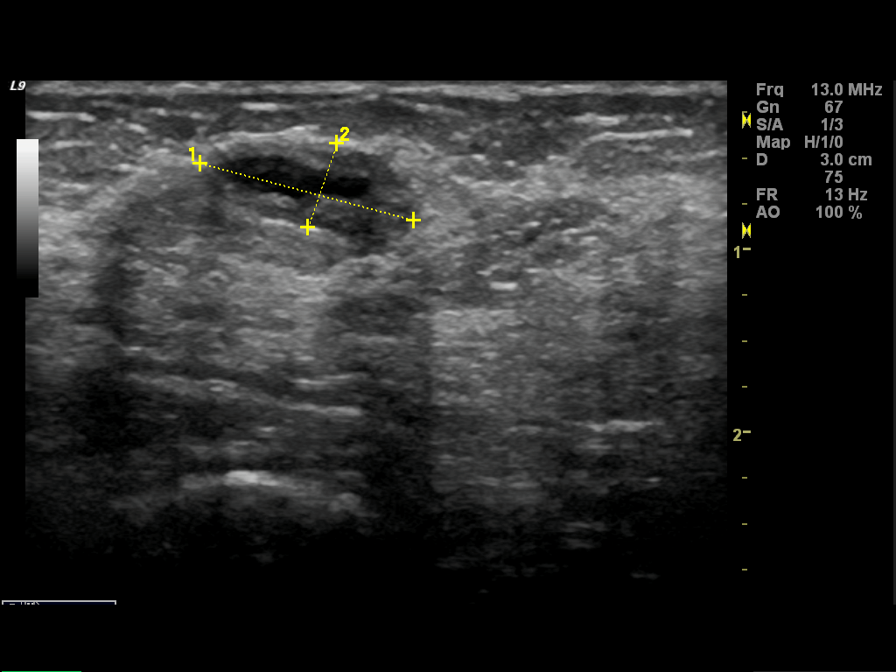
[im 8/9]
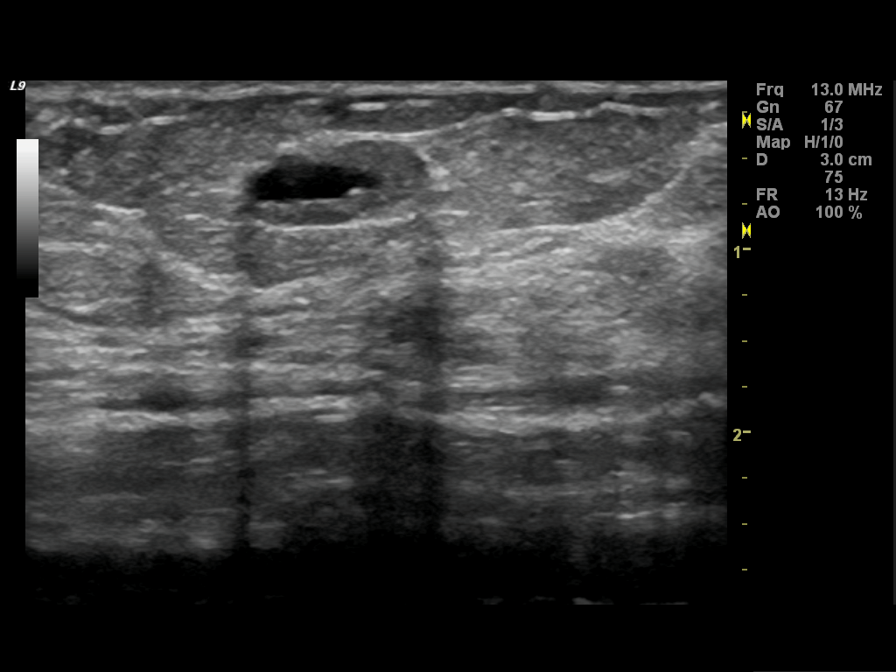
[im 9/9]
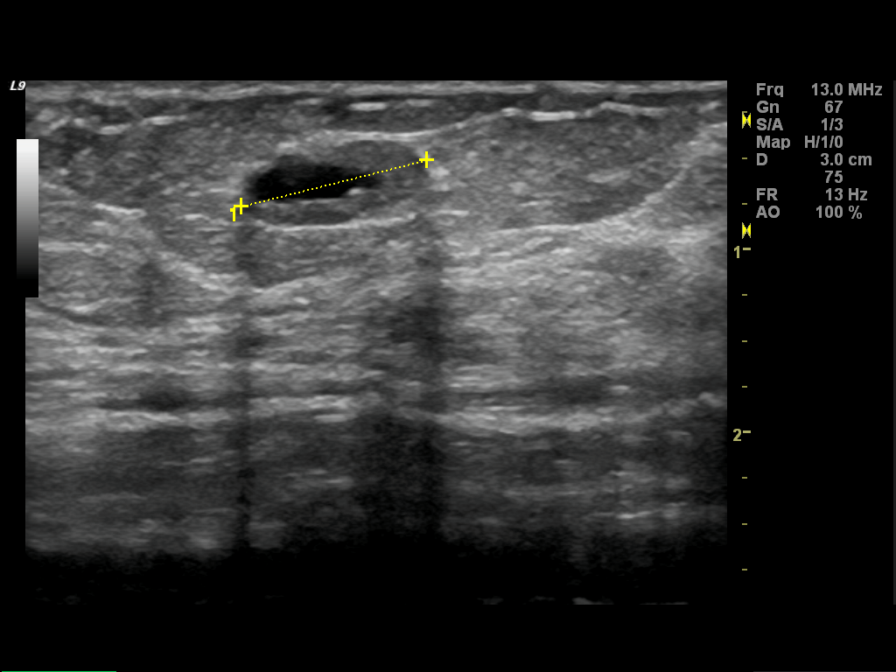

[9 of 9 positions shown; findings below may reference images not displayed]

ACR Breast Density Category c: The breast tissue is heterogeneously
dense, which may obscure small masses.
FINDINGS: No suspicious mass, distortion, or microcalcifications are
identified to suggest presence of malignancy. Stable bilateral
dystrophic calcifications are identified. There are circumscribed
masses in the lateral portion of the right breast in the medial
portion of the left breast, stable in appearance.

Mammographic images were processed with CAD.

On physical exam, I palpate no abnormality in the upper-outer
quadrant of the right breast or in the 12 o'clock location of the
right breast.

Ultrasound is performed, showing a circumscribed hypoechoic nodule
in the 12 o'clock location of the right breast 2 cm from the nipple.
This measures 1.1 x 1.1 x 0.9 cm. In the 10 o'clock location, a
solid and cystic nodule is 1.2 x 0.5 x 1.0 cm.
IMPRESSION: 1.  No mammographic or ultrasound evidence for malignancy.
2. Smaller right breast fibroadenomas are consistent with benign
lesions. No further follow-up is felt to be necessary for these
lesions.

RECOMMENDATION:
Screening mammogram in one year.(Code:[id number])

I have discussed the findings and recommendations with the patient.
Results were also provided in writing at the conclusion of the
visit. If applicable, a reminder letter will be sent to the patient
regarding the next appointment.

BI-RADS CATEGORY  2: Benign.

## 2015-09-06 ENCOUNTER — Telehealth: Payer: Self-pay | Admitting: Family Medicine

## 2015-11-10 IMAGING — US US SOFT TISSUE HEAD/NECK
1 series · 14 of 25 positions shown · non-contrast
Comparison: 05/23/2014

CLINICAL DATA: Evaluate thyroid nodules.

EXAM:
THYROID ULTRASOUND
TECHNIQUE: Ultrasound examination of the thyroid gland and adjacent soft
tissues was performed.

[Series 1: us soft tissue head/neck · 0.06mm/px · 14 of 41 slices shown]
[im 1/41]
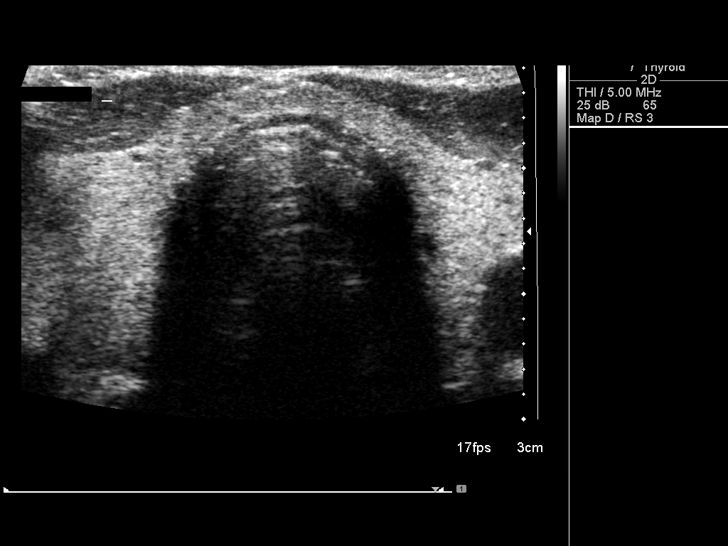
[im 4/41]
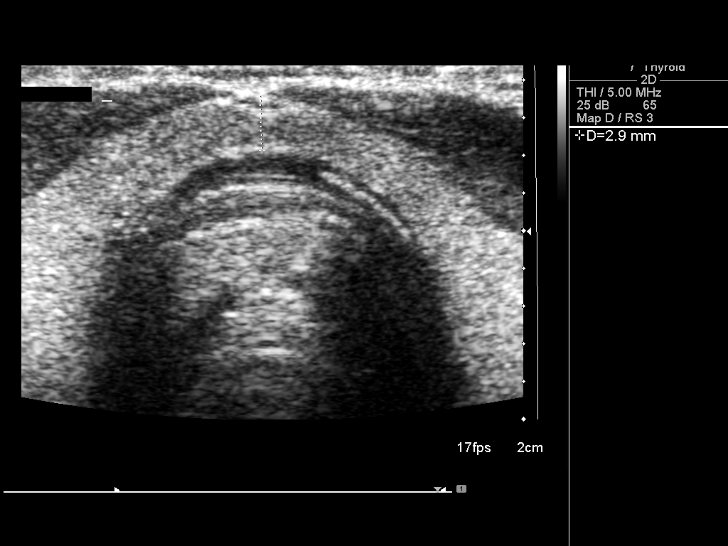
[im 7/41]
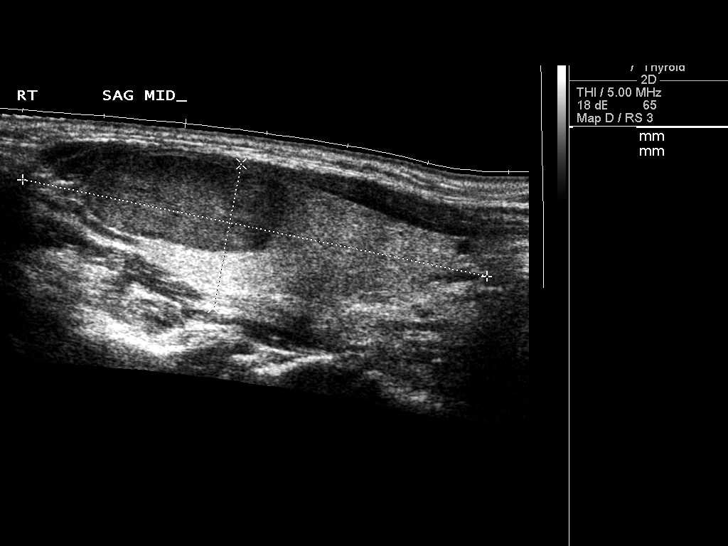
[im 11/41]
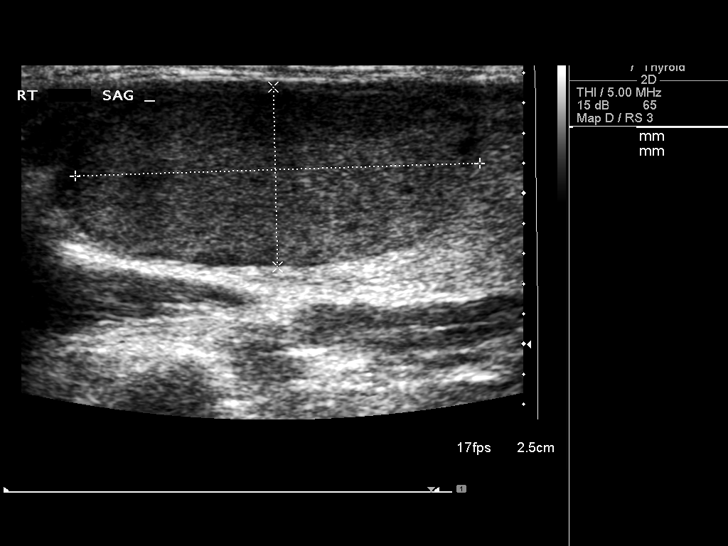
[im 14/41]
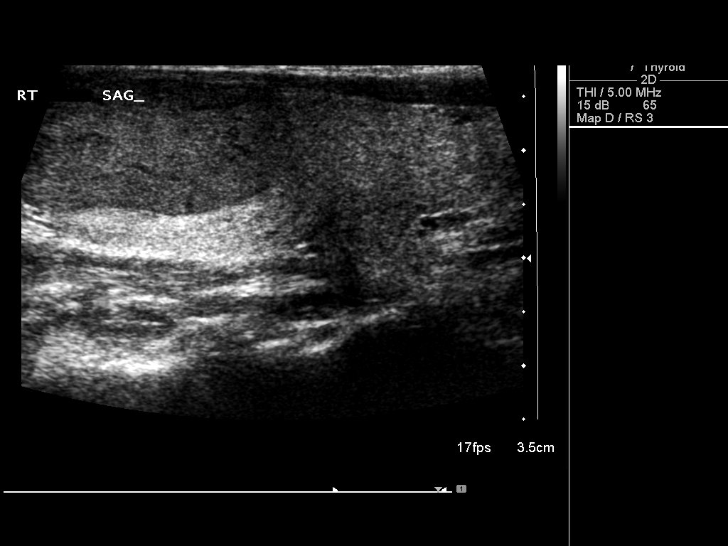
[im 16/41]
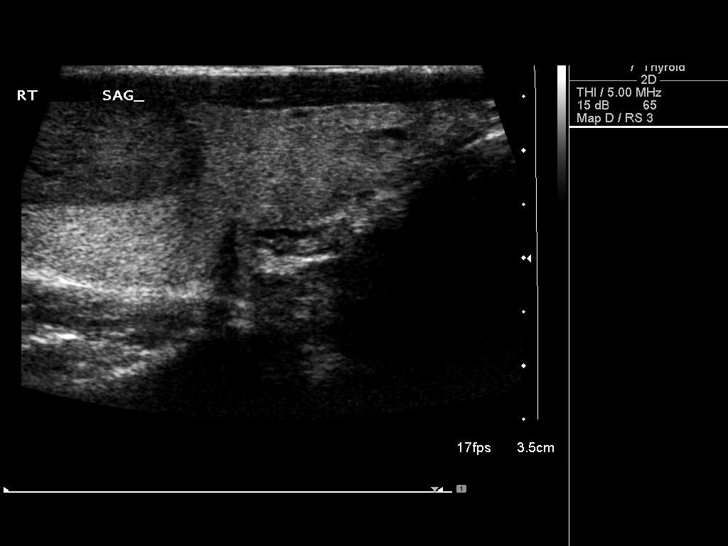
[im 19/41]
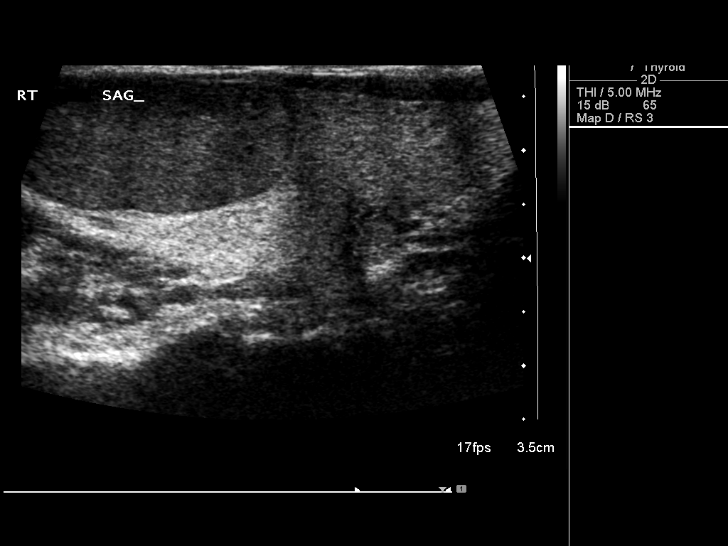
[im 22/41]
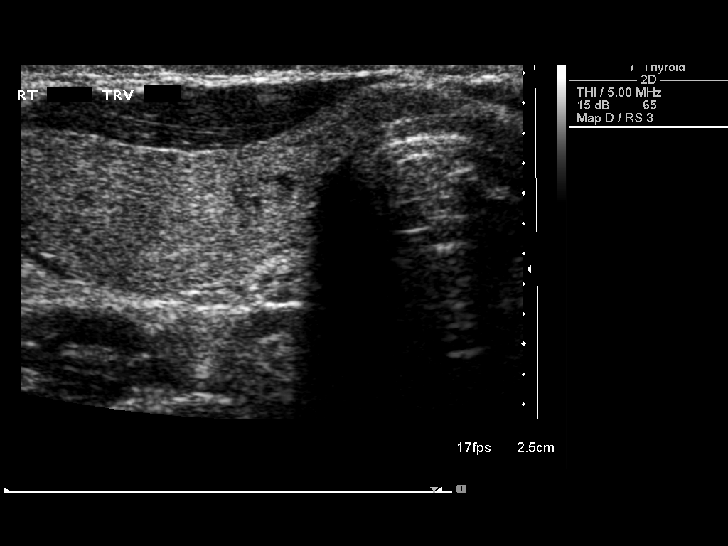
[im 26/41]
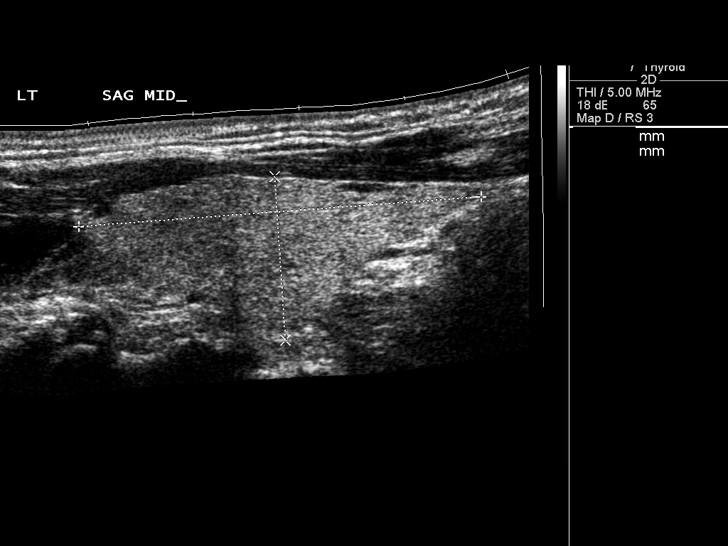
[im 27/41]
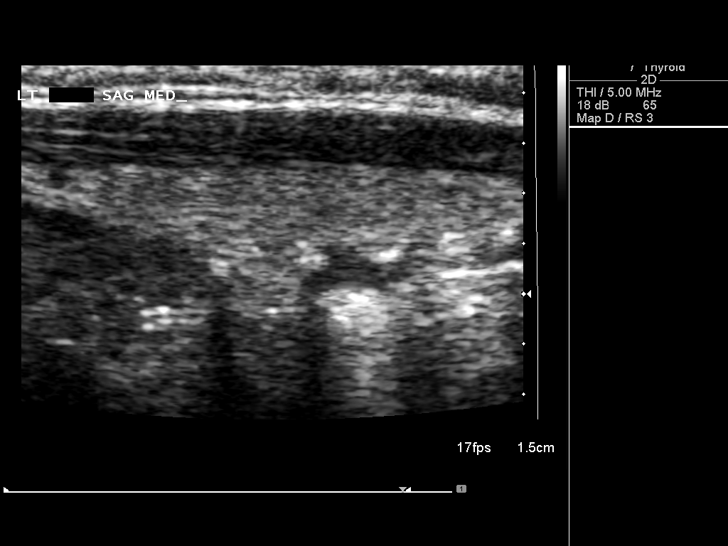
[im 31/41]
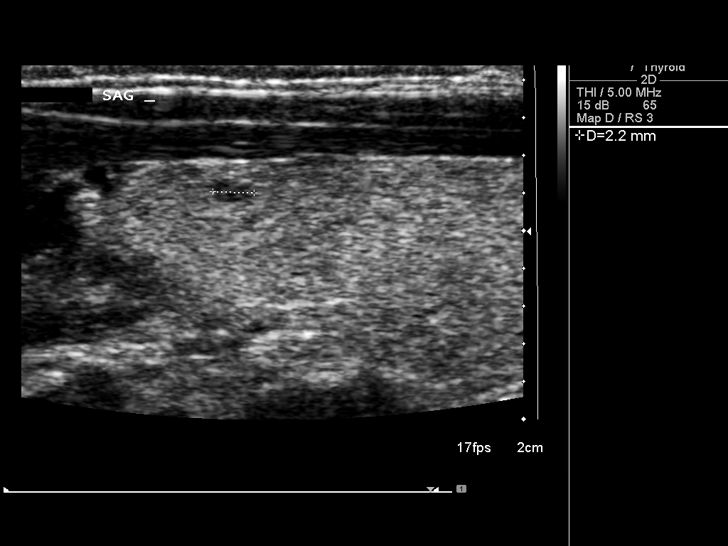
[im 34/41]
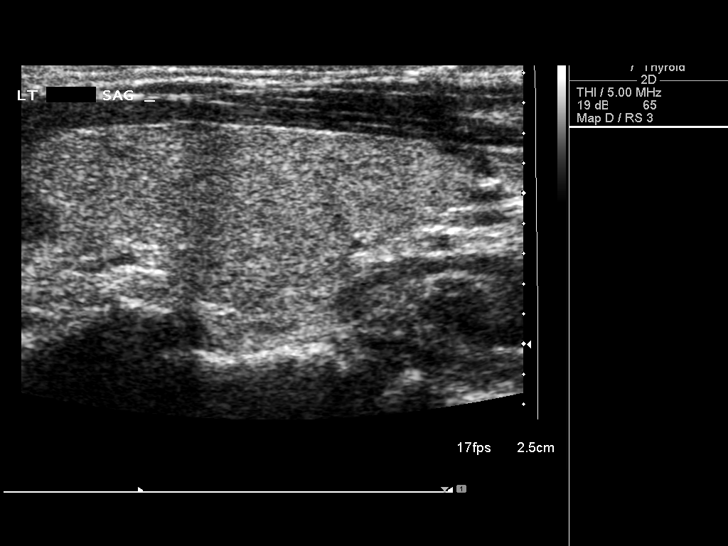
[im 37/41]
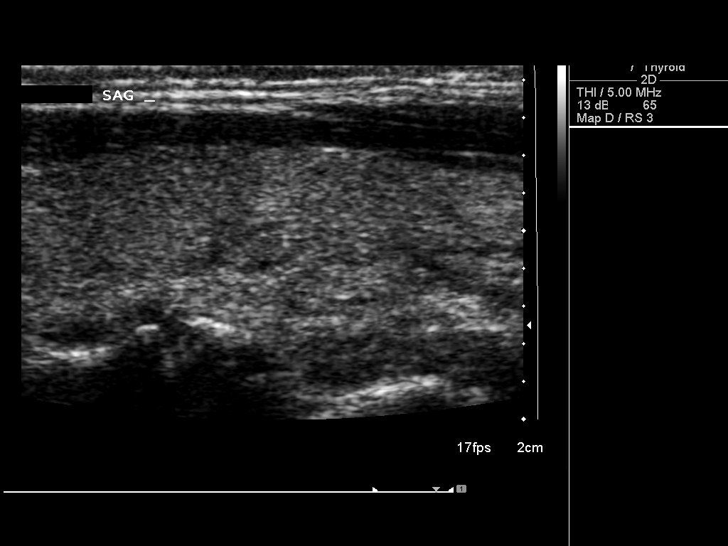
[im 41/41]
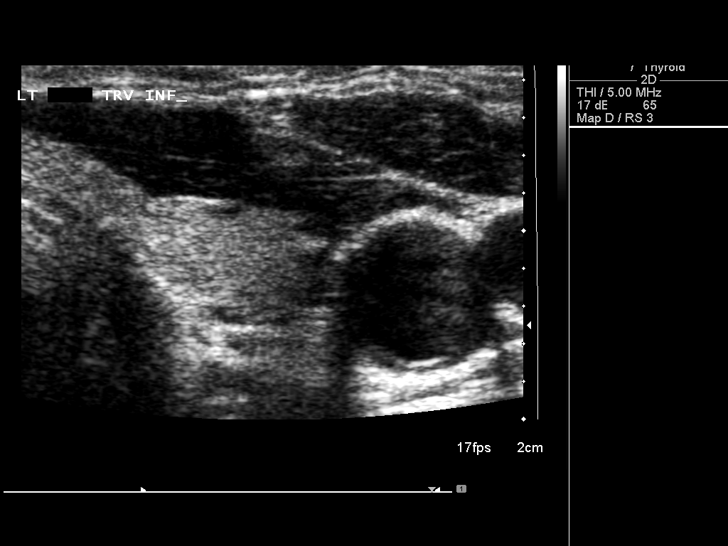

[14 of 25 positions shown; findings below may reference images not displayed]

FINDINGS: Right thyroid lobe

Measurements: 5.8 x 1.8 x 2.2 cm. Again noted is solid hypoechoic
nodule in the superior right thyroid lobe. This nodule measures
x 1.2 x 2.0 cm. Nodule previously measured 2.6 x 1.3 x 2.0 cm. The
dominant nodule is mildly vascular. There is a subtle hypoechoic
nodule along the posterior right thyroid lobe that measures up to
0.7 cm and previously measured 0.6 cm. Subtle nodule along the
medial and inferior right thyroid lobe measures 0.4 cm and
unchanged.

Left thyroid lobe

Measurements: 3.8 x 1.6 x 1.3 cm. Two small nodules in the left
thyroid lobe and the largest measures 0.4 cm.

Isthmus

Thickness: 0.3 cm.  No nodules visualized.

Lymphadenopathy

None visualized.
IMPRESSION: Bilateral thyroid nodules. The dominant right thyroid nodule is
stable. The dominant nodule meets the criteria for ultrasound-guided
biopsy if it has not been biopsied in the past.

## 2015-12-11 ENCOUNTER — Ambulatory Visit: Payer: Self-pay | Admitting: Obstetrics and Gynecology

## 2015-12-11 ENCOUNTER — Encounter: Payer: Self-pay | Admitting: Obstetrics and Gynecology

## 2015-12-11 ENCOUNTER — Ambulatory Visit (INDEPENDENT_AMBULATORY_CARE_PROVIDER_SITE_OTHER): Payer: BLUE CROSS/BLUE SHIELD | Admitting: Obstetrics and Gynecology

## 2015-12-11 VITALS — BP 114/67 | HR 75 | Temp 98.0°F | Wt 138.0 lb

## 2015-12-11 DIAGNOSIS — J0191 Acute recurrent sinusitis, unspecified: Secondary | ICD-10-CM | POA: Diagnosis not present

## 2015-12-11 MED ORDER — SALINE SPRAY 0.65 % NA SOLN: 1.0000 | NASAL | Status: DC | PRN

## 2015-12-11 MED ORDER — DOXYCYCLINE HYCLATE 100 MG PO TABS: 100.0000 mg | ORAL_TABLET | Freq: Two times a day (BID) | ORAL | Status: DC

## 2015-12-11 NOTE — Progress Notes (Signed)
   Subjective:   Patient ID: Gail Gonzalez, female    DOB: Feb 01, 1961, 55 y.o.   MRN: [id number]  Patient presents for Same Day Appointment  Chief Complaint  Patient presents with  . Sinusitis    HPI: #Sinus Pain: -symtpoms have lasted for about a month -started with rhinorrhea and congestion -now congestion is thick and yellow-green -unable to blow anything out -started getting sinus pain with associated dizziness -feels like her head has a lot of pressure -believes this is a sinus infection -gets sinus infections once or twice a year; with season changes -Tried: dayquil day and night. Temporarily relieves symtpoms -Sick contacts at work -ROS: +cough, +nausea, denies headache, fevers, abdominal pain, vomiting  Current every day smoker - 6 cigs a day  Review of Systems   See HPI for ROS.   Past medical history, surgical, family, and social history reviewed and updated in the EMR as appropriate.  Objective:  BP 114/67 mmHg  Pulse 75  Temp(Src) 98 F (36.7 C) (Oral)  Wt 138 lb (62.596 kg) Vitals and nursing note reviewed  Physical Exam  Constitutional: She is well-developed, well-nourished, and in no distress.  HENT:  Head: Normocephalic and atraumatic.  Mouth/Throat: Oropharynx is clear and moist.  Eyes: Conjunctivae and EOM are normal. Pupils are equal, round, and reactive to light.  No sinus tenderness  Neck: Normal range of motion. Neck supple.  Cardiovascular: Normal rate.   Pulmonary/Chest: Effort normal and breath sounds normal.  Lymphadenopathy:    She has cervical adenopathy.    Assessment & Plan:  1. Acute recurrent sinusitis, unspecified location Patient with acute sinusitis now lasting for 4 weeks. Precipitated by URI. No red flags on exam. Has been afebrile but symptoms and consistency of mucous consistent with sinusitis. She gets recurrent sinus infections that require antibiotics. Would consider sending her to ENT for further evaluation or recurrent  sinus infections. Rx for doxycycline given today along with nasal saline to help loosen mucous. Discussed other conservative measures such as humidifier and neti pot. Handout given along with return precautions.    Meds ordered this encounter  Medications  . doxycycline (VIBRA-TABS) 100 MG tablet    Sig: Take 1 tablet (100 mg total) by mouth 2 (two) times daily.    Dispense:  20 tablet    Refill:  0  . sodium chloride (OCEAN) 0.65 % SOLN nasal spray    Sig: Place 1 spray into both nostrils as needed for congestion.    Dispense:  30 mL    Refill:  [place], DO 12/11/2015, 4:49 PM PGY-2, [place]

## 2015-12-11 NOTE — Patient Instructions (Signed)

## 2015-12-13 ENCOUNTER — Ambulatory Visit: Payer: Self-pay | Admitting: Family Medicine

## 2016-03-04 ENCOUNTER — Ambulatory Visit: Payer: BLUE CROSS/BLUE SHIELD | Admitting: Family Medicine

## 2016-03-05 ENCOUNTER — Encounter: Payer: Self-pay | Admitting: Internal Medicine

## 2016-03-05 ENCOUNTER — Ambulatory Visit (INDEPENDENT_AMBULATORY_CARE_PROVIDER_SITE_OTHER): Payer: BLUE CROSS/BLUE SHIELD | Admitting: Internal Medicine

## 2016-03-05 VITALS — BP 102/63 | HR 75 | Ht 69.0 in | Wt 137.8 lb

## 2016-03-05 DIAGNOSIS — L821 Other seborrheic keratosis: Secondary | ICD-10-CM

## 2016-03-05 DIAGNOSIS — M545 Low back pain: Secondary | ICD-10-CM | POA: Diagnosis not present

## 2016-03-05 MED ORDER — CYCLOBENZAPRINE HCL 10 MG PO TABS: 10.0000 mg | ORAL_TABLET | Freq: Three times a day (TID) | ORAL | Status: DC | PRN

## 2016-03-05 NOTE — Patient Instructions (Signed)
Please schedule an appointment with the dermatology clinic, [facility], for removal of your mole.   Take the flexeril for muscle tightness. Do not drive or drink while using this medication. Continue using heat. Try ibuprofen for further relief.   Take Care,   Dr. Juleen China

## 2016-03-06 DIAGNOSIS — L821 Other seborrheic keratosis: Secondary | ICD-10-CM | POA: Insufficient documentation

## 2016-03-06 DIAGNOSIS — M549 Dorsalgia, unspecified: Secondary | ICD-10-CM | POA: Insufficient documentation

## 2016-03-06 NOTE — Assessment & Plan Note (Signed)
Suspect muscular strain as cause of back pain given reported trigger as well as otherwise benign history and PE.  -Flexeril #10 Rx given, precautions while taking medication discussed -encouraged OTC pain relief medications such as ibuprofen -continue heat if it provides relief  -good posturing and supportive chair while sewing

## 2016-03-06 NOTE — Progress Notes (Signed)
   Subjective:    Gail Gonzalez - 55 y.o. female MRN [id number]  Date of birth: May 29, 1961  HPI  Gail Gonzalez is here for moles and back pain.  Moles: Reports she has two moles on the right side of her upper back. One is flat and brown and has been present for several years. The other, which is the one of particular concern for her today, is raised and black. The black mole appeared last summer and has been progressively getting bigger. She states that it frequently itches. Denies associated pain or bleeding from the site. Admits to sun bathing often last summer. Denies personal or family h/o skin cancer.   Back Pain: Has been present for several weeks. Pain located in her lower back laterally on both sides of her spine. 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 Denies weakness, change in gait, loss of sensation in lower extremities, and bowel/bladder incontinence. No fevers at home and has otherwise felt well. Has tried heating pad to the area with temporary relief. Has used Flexeril in the past with good relief.    -  reports that she has been smoking Cigarettes.  She has a 3.75 pack-year smoking history. She has never used smokeless tobacco. - Review of Systems: Per HPI. - Past Medical History: Patient Active Problem List   Diagnosis Date Noted  . Seborrheic keratosis 03/06/2016  . Back pain 03/06/2016  . Vitamin D deficiency 06/11/2015  . 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 Baker's cyst, unruptured 12/30/2012  . GERD (gastroesophageal reflux disease) 07/29/2012  . Endometriosis 07/29/2012  . Thyroid nodule 07/29/2012  . S/P left breast biopsy 07/29/2012   - Medications: reviewed and updated    Objective:   Physical Exam BP 102/63 mmHg  Pulse 75  Ht 5\' 9"  (1.753 m)  Wt 137 lb 12.8 oz (62.506 kg)  BMI 20.34 kg/m2 Gen: NAD, alert, cooperative with exam, well-appearing MSK:  Tight  lumbar paraspinal muscles L >R. ROM not limited.  Neuro: Sensation grossly intact in LE. Strength 5/5 in LE.  Skin: round well-demarcated 0.5 cm x 0.5 cm raised black lesion with verrucous like appearance located over right scapula, lighter-brown flat lesion with stuck on appearance located below  Neuro: no gross deficits.  Psych: good insight, alert and oriented          Assessment & Plan:   Seborrheic keratosis Suspect black lesion is a SK as patient has another lesion that is consistent with SK. However, some concern for malignancy given quick appearance and growth in the setting of sun exposure.  -derm clinic appointment to be scheduled for removal and biopsy   Back pain Suspect muscular strain as cause of back pain given reported trigger as well as otherwise benign history and PE.  -Flexeril #10 Rx given, precautions while taking medication discussed -encouraged OTC pain relief medications such as ibuprofen -continue heat if it provides relief  -good posturing and supportive chair while sewing      Phill Myron, D.O. 03/06/2016, 1:10 PM PGY-1, [place]

## 2016-03-06 NOTE — Assessment & Plan Note (Signed)
Suspect black lesion is a SK as patient has another lesion that is consistent with SK. However, some concern for malignancy given quick appearance and growth in the setting of sun exposure.  -derm clinic appointment to be scheduled for removal and biopsy

## 2016-03-26 ENCOUNTER — Ambulatory Visit (INDEPENDENT_AMBULATORY_CARE_PROVIDER_SITE_OTHER): Payer: BLUE CROSS/BLUE SHIELD | Admitting: Family Medicine

## 2016-03-26 VITALS — BP 129/77 | HR 85 | Temp 98.1°F | Wt 136.0 lb

## 2016-03-26 DIAGNOSIS — D489 Neoplasm of uncertain behavior, unspecified: Secondary | ICD-10-CM | POA: Diagnosis not present

## 2016-03-26 NOTE — Progress Notes (Signed)
   Subjective:    Patient ID: Gail Gonzalez, female    DOB: 21-Jan-1961, 55 y.o.   MRN: [id number]  HPI 55 y/o female presents for mole removal.  Mole - on back, present for one year, husband thinks is a new lesion, + pruritis, patient admits to unprotected skin exposure as a child.    Review of Systems     Objective:   Physical Exam BP 129/77 mmHg  Pulse 85  Temp(Src) 98.1 F (36.7 C) (Oral)  Wt 136 lb (61.689 kg)  Skin: Patient declined full body skin exam: 4 mm by 5 mm hyperpigmented lesion with regular borders of right upper back.   Procedure note: Skin Excisional Biopsy Risks and benefits of the procedure discussed with the patient. Written consent obtained. Area was marked with a marking pen (10 mm wide, 30 mm long). Area prepped in sterile fashion. Using a 25 guage 1.5 in needle 4 cc of Lidocaine without epinephrine was instilled into the surgical area. A 10 blade scalpel and pickups were used to excise the lesion down to the subcutaneous layer. Hemostasis was achieved with gentle pressure. A total of 5 interrupted sutures using 4.0 Nylon were used to close the excision site. Patient tolerated well. No complications. Specimen sent for pathology.      Assessment & Plan:  Neoplasm of uncertain behavior 4 mm by 5 mm hyperpigmented lesion of upper right back. -excisional biopsy performed (3 mm margins) -sent for pathology

## 2016-03-26 NOTE — Assessment & Plan Note (Signed)
4 mm by 5 mm hyperpigmented lesion of upper right back. -excisional biopsy performed (3 mm margins) -sent for pathology

## 2016-03-26 NOTE — Patient Instructions (Signed)
   Excision of Skin Lesions, Care After Refer to this sheet in the next few weeks. These instructions provide you with information about caring for yourself after your procedure. Your health care provider may also give you more specific instructions. Your treatment has been planned according to current medical practices, but problems sometimes occur. Call your health care provider if you have any problems or questions after your procedure. WHAT TO EXPECT AFTER THE PROCEDURE After your procedure, it is common to have pain or discomfort at the excision site. HOME CARE INSTRUCTIONS  Take over-the-counter and prescription medicines only as told by your health care provider.  Follow instructions from your health care provider about:  How to take care of your excision site. You should keep the site clean, dry, and protected for at least 48 hours.  When and how you should change your bandage (dressing).  When you should remove your dressing.  Removing whatever was used to close your excision site.  Check the excision area every day for signs of infection. Watch for:  Redness, swelling, or pain.  Fluid, blood, or pus.  For bleeding, apply gentle but firm pressure to the area using a folded towel for 20 minutes.  Avoid high-impact exercise and activities until the stitches (sutures) are removed or the area heals.  Follow instructions from your health care provider about how to minimize scarring. Avoid sun exposure until the area has healed. Scarring should lessen over time.  Keep all follow-up visits as told by your health care provider. This is important. SEEK MEDICAL CARE IF:  You have a fever.  You have redness, swelling, or pain at the excision site.  You have fluid, blood, or pus coming from the excision site.  You have ongoing bleeding at the excision site.  You have pain that does not improve in 2-3 days after your procedure.  You notice skin irregularities or changes in  sensation.   This information is not intended to replace advice given to you by your health care provider. Make sure you discuss any questions you have with your health care provider.   Document Released: 02/27/2015 Document Reviewed: 02/27/2015 Elsevier Interactive Patient Education [employer].   Return in one week for nurse visit for suture removal. Dr. Ree Kida will call you with the results.

## 2016-04-02 ENCOUNTER — Ambulatory Visit (INDEPENDENT_AMBULATORY_CARE_PROVIDER_SITE_OTHER): Payer: BLUE CROSS/BLUE SHIELD | Admitting: *Deleted

## 2016-04-02 ENCOUNTER — Encounter: Payer: Self-pay | Admitting: Family Medicine

## 2016-04-02 DIAGNOSIS — Z4802 Encounter for removal of sutures: Secondary | ICD-10-CM

## 2016-04-02 NOTE — Progress Notes (Signed)
   Patient in nurse clinic for suture removal on her right upper back. Sutures placed 03/26/16.  Five sutures were placed; area prepped with betadine swab and alcohol swab.  Five sutures were removed without difficultly.  Patient tolerated procedure well.  No signs of infection.  Advised patient of when to call clinic for an appointment due to signs of infection.  Patient also requested results of biopsy.  Will forward to PCP.  Derl Barrow, RN

## 2016-04-04 ENCOUNTER — Emergency Department (HOSPITAL_COMMUNITY)
Admission: EM | Admit: 2016-04-04 | Discharge: 2016-04-04 | Disposition: A | Discharge status: Home / Self Care | Payer: BLUE CROSS/BLUE SHIELD | Attending: Emergency Medicine | Admitting: Emergency Medicine

## 2016-04-04 ENCOUNTER — Encounter (HOSPITAL_COMMUNITY): Payer: Self-pay | Admitting: Emergency Medicine

## 2016-04-04 DIAGNOSIS — T8131XA Disruption of external operation (surgical) wound, not elsewhere classified, initial encounter: Secondary | ICD-10-CM | POA: Insufficient documentation

## 2016-04-04 DIAGNOSIS — T8130XA Disruption of wound, unspecified, initial encounter: Secondary | ICD-10-CM

## 2016-04-04 DIAGNOSIS — F17210 Nicotine dependence, cigarettes, uncomplicated: Secondary | ICD-10-CM | POA: Diagnosis not present

## 2016-04-04 DIAGNOSIS — Y69 Unspecified misadventure during surgical and medical care: Secondary | ICD-10-CM | POA: Diagnosis not present

## 2016-04-04 DIAGNOSIS — Z79899 Other long term (current) drug therapy: Secondary | ICD-10-CM | POA: Diagnosis not present

## 2016-04-04 NOTE — ED Notes (Signed)
Pt. reports dehisced incision ( mole biopsy) after suture removal at right scapula this Wednesday , no bleeding or drainage .

## 2016-04-04 NOTE — Discharge Instructions (Signed)
Follow up with your doctor at the [facility]. Return here as needed.

## 2016-04-04 NOTE — ED Provider Notes (Signed)
CSN: [id number]     Arrival date & time 04/04/16  1906 History  By signing my name below, I, Jasmyn B. Alexander, attest that this documentation has been prepared under the direction and in the presence of [facility] M. Janit Bern, NP  Electronically Signed: Tedra Coupe Sheppard Coil, ED Scribe. 04/04/2016. 7:58 PM.    Chief Complaint  Patient presents with  . Post-op Problem    The history is provided by the patient. No language interpreter was used.   HPI Comments: Gail Gonzalez is a 55 y.o. female who presents to the Emergency Department complaining of sudden onset, sharp right shoulder pain x 1 day. Pt reports having a mole biopsy procedure on 03/26/16 where she had several sutures placed in right upper shoulder. She states she had sutures removed on 04/02/16 by a nurse at [facility]. Last night she reports laying down where she felt a sharp pain at site. Her husband states the wound did not look fully healed and she was worried sutures were removed prematurely before complete healing of procedure site. Patient is not having any pain at the moment. There are no alleviating factors  Past Medical History  Diagnosis Date  . Chronic back pain   . GERD (gastroesophageal reflux disease)   . Thyroid disease 2012    nodule  . Endometriosis     on lap surgery. seen by Dr. Mancel Bale  . Rectal bleeding   . Hemorrhoids   . Anemia   . Anxiety   . Arthritis   . Heart murmur   . Diverticulosis 02/2013  . Colon polyps 04/24/2013   Past Surgical History  Procedure Laterality Date  . Biopsy thyroid  2013  . Diagnostic laparoscopy  1990    endometriosis  . Breast biopsy Left    Family History  Problem Relation Age of Onset  . Alcohol abuse Mother   . Cancer Mother     breast  . Diabetes Mother   . Hypertension Mother   . Alcohol abuse Father   . Esophageal cancer Neg Hx   . Rectal cancer Neg Hx   . Stomach cancer Neg Hx   . Colon cancer Maternal Uncle    Social History  Substance Use Topics  .  Smoking status: Current Every Day Smoker -- 0.25 packs/day for 15 years    Types: Cigarettes    Last Attempt to Quit: 06/10/2012  . Smokeless tobacco: Never Used  . Alcohol Use: 2.4 oz/week    4 Standard drinks or equivalent per week     Comment: socailly   OB History    Gravida Para Term Preterm AB TAB SAB Ectopic Multiple Living   3 2             Review of Systems  Constitutional: Negative for fever.  Skin: Positive for wound.  All other systems reviewed and are negative.     Allergies  Review of patient's allergies indicates no known allergies.  Home Medications   Prior to Admission medications   Medication Sig Start Date End Date Taking? Authorizing Provider  cyclobenzaprine (FLEXERIL) 10 MG tablet Take 1 tablet (10 mg total) by mouth 3 (three) times daily as needed for muscle spasms. 03/05/16   Nicolette Bang, DO  doxycycline (VIBRA-TABS) 100 MG tablet Take 1 tablet (100 mg total) by mouth 2 (two) times daily. 12/11/15   Katheren Shams, DO  Multiple Vitamin (MULTIVITAMIN) capsule Take 1 capsule by mouth daily.    Historical Provider, MD  omeprazole (  PRILOSEC) 40 MG capsule Take 1 capsule (40 mg total) by mouth daily. 06/11/15   Patrecia Pour, MD  sodium chloride (OCEAN) 0.65 % SOLN nasal spray Place 1 spray into both nostrils as needed for congestion. 12/11/15   Katheren Shams, DO   BP 134/77 mmHg  Pulse 75  Temp(Src) 98.7 F (37.1 C) (Oral)  Resp 14  Ht 5\' 9"  (1.753 m)  Wt 62.681 kg  BMI 20.40 kg/m2  SpO2 100% Physical Exam  Constitutional: She is oriented to person, place, and time. She appears well-developed and well-nourished.  HENT:  Head: Normocephalic and atraumatic.  Eyes: EOM are normal. Pupils are equal, round, and reactive to light.  Neck: Normal range of motion.  Cardiovascular: Normal rate, regular rhythm and normal heart sounds.   Pulmonary/Chest: Effort normal and breath sounds normal. No respiratory distress. She has no wheezes. She has no  rales.  Abdominal: Soft. Bowel sounds are normal. She exhibits no distension. There is no tenderness. There is no rebound and no guarding.  Musculoskeletal: Normal range of motion.  Neurological: She is alert and oriented to person, place, and time.  Skin: Skin is warm and dry. No rash noted. No erythema.  Healing wound s/p mole biopsy. No erythema. No red streaking. Wound is open. No bleeding  Psychiatric: She has a normal mood and affect. Judgment normal.  Nursing note and vitals reviewed.   ED Course  Procedures (including critical care time) DIAGNOSTIC STUDIES: Oxygen Saturation is 100% on RA, normal by my interpretation.    COORDINATION OF CARE: 7:40 PM-Discussed treatment plan which includes wound care with pt at bedside and pt agreed to plan.   7:56 PM Cleaned with betadine and normal saline irrigation. Loosely closed with steri strips.   MDM   Final diagnoses:  Wound disruption, initial encounter  Patient returns for check of recent incision s/p skin biopsy. The region appears to be well-healing without signs of infection.. Afebrile and hemodynamically stable. Pt is instructed to continue with home care. Pt has a good understanding of return precautions and is safe for discharge at this time. She will f/u with her PCP.    I personally performed the services described in this documentation, which was scribed in my presence. The recorded information has been reviewed and is accurate.    [place], [place] 04/04/16 2134  Fredia Sorrow, MD 04/11/16 [phone]

## 2016-04-05 ENCOUNTER — Telehealth: Payer: Self-pay | Admitting: Family Medicine

## 2016-04-05 NOTE — Telephone Encounter (Signed)
She had a biopsy last week to have a mole removed. She had stitches taken out last Wednesday. It busted open yesterday all the way. She came to the hospital to have butterflies placed. Since Sunday her jaw has been feeling odd. Now her teeth cannot clench when she chews. Advised that she follow up in clinic on Monday.   Rosemarie Ax, MD PGY-3, [facility] 04/05/2016, 11:53 PM

## 2016-04-07 ENCOUNTER — Ambulatory Visit (INDEPENDENT_AMBULATORY_CARE_PROVIDER_SITE_OTHER): Payer: BLUE CROSS/BLUE SHIELD | Admitting: Family Medicine

## 2016-04-07 ENCOUNTER — Encounter: Payer: Self-pay | Admitting: Family Medicine

## 2016-04-07 VITALS — BP 133/86 | HR 73 | Temp 97.7°F | Ht 69.0 in | Wt 137.4 lb

## 2016-04-07 DIAGNOSIS — M26609 Unspecified temporomandibular joint disorder, unspecified side: Secondary | ICD-10-CM | POA: Diagnosis not present

## 2016-04-07 DIAGNOSIS — T8131XD Disruption of external operation (surgical) wound, not elsewhere classified, subsequent encounter: Secondary | ICD-10-CM

## 2016-04-07 NOTE — Progress Notes (Signed)
Date of Visit: 04/07/2016   HPI:  Patient presents for a same day appointment to have wound re-evaluated after post procedure complication.  Patient underwent excisional biopsy of mole on back on 5/31 by Dr. Ree Kida here at [facility]. One week later on 6/7, she returned for suture removal in nurse clinic. Two days after suture removal, her wound opened up spontaneously. As it was after clinic hours, she had to go the ER for wound repair. Was told in the ED that it appears her stitches were potentially removed too soon. Had steristrips placed over the wound to reapproximate it. Now it is doing well. Draining minimally. No significant pain. Needs note saying she can go back to work.  Also wants to discuss jaw pain. Has sense of not being able to bite down normally with her jaw, as if it won't close all the way. Feels pain in the TMJ area. Aleve helps some. Present for over a week. Not getting better or worse, is just the same. Eating and drinking well.. No fevers. Last tetanus shot was approximately 20 years ago.  ROS: See HPI  [place]: history of GERD, thyroid nodule, bakers cyst, vit D deficiency, duodenal ulcer  PHYSICAL EXAM: BP 133/86 mmHg  Pulse 73  Temp(Src) 97.7 F (36.5 C) (Oral)  Ht 5\' 9"  (1.753 m)  Wt 137 lb 6.4 oz (62.324 kg)  BMI 20.28 kg/m2 Gen: NAD, pleasant, cooperative, well appearing HEENT: normocephalic, atraumatic, moist mucous membranes. No trisumus. Opens jaw fully. Normal dentition. Tympanic membranes clear bilaterally Skin: area of prior skin biopsy well approximated with sutures, and healing well. No erythema, drainage, or warmth.   ASSESSMENT/PLAN:  1. Wound dehiscense - doing well now with steri strips. Given a supply of strips should she need to replace them at home after showering etc. Patient inquiring about help with her copay at the ED since this was a procedural complication. Encouraged her to give her insurance company a call and that if we can  write a letter saying it was medically necessary for her to be seen in ED, we can certainly do that if it would help. Ok to return to work now. Patient appreciative.  2. Jaw pain - no trismus or concern for tetanus. 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 Given handout educating patient on this topic, including home exercises to try. Follow up with PCP in 3-4 weeks. Discussed return precautions, see after visit summary. Also recommend she see dentist to ensure no dental component to her discomfort.  FOLLOW UP: Follow up in 3-4 weeks for above issues Schedule appointment with dentist  Delorse Limber. Ardelia Mems, [place]

## 2016-04-07 NOTE — Patient Instructions (Signed)
Wound looks good Will continue to heal Leave steri strips in place until they come off on their own  Call your insurance agency about the ER copay. Let us know if we can help with writing a letter.  See photos of TMJ exercises Would schedule an appointent with a dentist to get that checked out as well Ok to return to work now  Follow up with Dr. Bonner Puna in 3-4 weeks Return sooner if you aren't able to eat/drink, or can't open your mouth all the way  Be well, Dr. Ardelia Mems   Temporomandibular Joint Syndrome Temporomandibular joint (TMJ) syndrome is a condition that affects the joints between your jaw and your skull. The TMJs are located near your ears and allow your jaw to open and close. These joints and the nearby muscles are involved in all movements of the jaw. People with TMJ syndrome have pain in the area of these joints and muscles. Chewing, biting, or other movements of the jaw can be difficult or painful. TMJ syndrome can be caused by various things. In many cases, the condition is mild and goes away within a few weeks. For some people, the condition can become a long-term problem. CAUSES Possible causes of TMJ syndrome include:  Grinding your teeth or clenching your jaw. Some people do this when they are under stress.  Arthritis.  Injury to the jaw.  Head or neck injury.  Teeth or dentures that are not aligned well. In some cases, the cause of TMJ syndrome may not be known. SIGNS AND SYMPTOMS The most common symptom is an aching pain on the side of the head in the area of the TMJ. Other symptoms may include:  Pain when moving your jaw, such as when chewing or biting.  Being unable to open your jaw all the way.  Making a clicking sound when you open your mouth.  Headache.  Earache.  Neck or shoulder pain. DIAGNOSIS Diagnosis can usually be made based on your symptoms, your medical history, and a physical exam. Your health care provider may check the range of motion  of your jaw. Imaging tests, such as X-rays or an MRI, are sometimes done. You may need to see your dentist to determine if your teeth and jaw are lined up correctly. TREATMENT TMJ syndrome often goes away on its own. If treatment is needed, the options may include:  Eating soft foods and applying ice or heat.  Medicines to relieve pain or inflammation.  Medicines to relax the muscles.  A splint, bite plate, or mouthpiece to prevent teeth grinding or jaw clenching.  Relaxation techniques or counseling to help reduce stress.  Transcutaneous electrical nerve stimulation (TENS). This helps to relieve pain by applying an electrical current through the skin.  Acupuncture. This is sometimes helpful to relieve pain.  Jaw surgery. This is rarely needed. HOME CARE INSTRUCTIONS  Take medicines only as directed by your health care provider.  Eat a soft diet if you are having trouble chewing.  Apply ice to the painful area.  Put ice in a plastic bag.  Place a towel between your skin and the bag.  Leave the ice on for 20 minutes, 2-3 times a day.  Apply a warm compress to the painful area as directed.  Massage your jaw area and perform any jaw stretching exercises as recommended by your health care provider.  If you were given a mouthpiece or bite plate, wear it as directed.  Avoid foods that require a lot of chewing.  Do not chew gum.  Keep all follow-up visits as directed by your health care provider. This is important. SEEK MEDICAL CARE IF:  You are having trouble eating.  You have new or worsening symptoms. SEEK IMMEDIATE MEDICAL CARE IF:  Your jaw locks open or closed.   This information is not intended to replace advice given to you by your health care provider. Make sure you discuss any questions you have with your health care provider.   Document Released: 07/08/2001 Document Revised: 11/03/2014 Document Reviewed: 05/18/2014 Elsevier Interactive Patient Education [employer]

## 2016-05-23 ENCOUNTER — Telehealth: Payer: Self-pay | Admitting: *Deleted

## 2016-05-23 NOTE — Telephone Encounter (Signed)
Patient calling asking for refills on alprazolam and ambien. Patient informed she would likely need an appointment as these meds were no longer on her med list. Patient states she doesn't take them unless she needs them that's why she is just now running out. Patient unwilling to make appointment, will forward to MD

## 2016-05-23 NOTE — Telephone Encounter (Signed)
she need office visit for evaluation.  Thanks! Bretta Bang

## 2016-05-28 NOTE — Telephone Encounter (Signed)
Left message for pt to call back to inform her of below and see if she wants to schedule an appointment.  Katharina Caper, Olliver Boyadjian D, [place]

## 2016-06-04 NOTE — Telephone Encounter (Signed)
LM for pt to call office back to inform her of below and to see if she wants to schedule and appointment. Katharina Caper, Tearsa Kowalewski D, [place]

## 2016-07-08 ENCOUNTER — Other Ambulatory Visit: Payer: Self-pay | Admitting: *Deleted

## 2016-07-08 DIAGNOSIS — K219 Gastro-esophageal reflux disease without esophagitis: Secondary | ICD-10-CM

## 2016-07-09 MED ORDER — OMEPRAZOLE 20 MG PO CPDR: 20.0000 mg | DELAYED_RELEASE_CAPSULE | Freq: Every day | ORAL | 0 refills | Status: DC

## 2016-07-11 NOTE — Telephone Encounter (Signed)
LVM for pt to call office back to inform her of  Dr. Cyndia Skeeters message. If she calls back please give her the message. Katharina Caper, April D, [place]

## 2016-08-11 ENCOUNTER — Other Ambulatory Visit: Payer: Self-pay | Admitting: Internal Medicine

## 2016-08-11 DIAGNOSIS — E049 Nontoxic goiter, unspecified: Secondary | ICD-10-CM

## 2016-09-02 ENCOUNTER — Ambulatory Visit
Admission: RE | Admit: 2016-09-02 | Discharge: 2016-09-02 | Disposition: A | Discharge status: Home / Self Care | Payer: BLUE CROSS/BLUE SHIELD | Source: Ambulatory Visit | Attending: Internal Medicine | Admitting: Internal Medicine

## 2016-09-02 DIAGNOSIS — E049 Nontoxic goiter, unspecified: Secondary | ICD-10-CM

## 2016-09-30 ENCOUNTER — Other Ambulatory Visit: Payer: Self-pay | Admitting: Student

## 2016-09-30 DIAGNOSIS — K219 Gastro-esophageal reflux disease without esophagitis: Secondary | ICD-10-CM

## 2016-10-08 ENCOUNTER — Ambulatory Visit (INDEPENDENT_AMBULATORY_CARE_PROVIDER_SITE_OTHER): Payer: BLUE CROSS/BLUE SHIELD | Admitting: Student

## 2016-10-08 VITALS — BP 99/80 | HR 64 | Temp 98.3°F | Ht 69.0 in | Wt 138.0 lb

## 2016-10-08 DIAGNOSIS — G4700 Insomnia, unspecified: Secondary | ICD-10-CM

## 2016-10-08 DIAGNOSIS — Z23 Encounter for immunization: Secondary | ICD-10-CM | POA: Diagnosis not present

## 2016-10-08 DIAGNOSIS — R739 Hyperglycemia, unspecified: Secondary | ICD-10-CM | POA: Diagnosis not present

## 2016-10-08 DIAGNOSIS — Z0000 Encounter for general adult medical examination without abnormal findings: Secondary | ICD-10-CM

## 2016-10-08 DIAGNOSIS — F17200 Nicotine dependence, unspecified, uncomplicated: Secondary | ICD-10-CM

## 2016-10-08 DIAGNOSIS — E785 Hyperlipidemia, unspecified: Secondary | ICD-10-CM

## 2016-10-08 LAB — POCT GLYCOSYLATED HEMOGLOBIN (HGB A1C): HEMOGLOBIN A1C: 4.9

## 2016-10-08 MED ORDER — TRAZODONE HCL 50 MG PO TABS: 25.0000 mg | ORAL_TABLET | Freq: Every evening | ORAL | 1 refills | Status: DC | PRN

## 2016-10-08 NOTE — Patient Instructions (Signed)
It was great seeing you today! We have addressed the following issues today  1. Cholesterol: I recommend lifestyle change including diet and exercise. Please look below for more on this. I also like you to schedule a lab visit in the next few days for fasting cholesterol.   2.   Smoking: Please schedule an appointment with our pharmacist, Dr. Valentina Lucks to discuss about this  3.   Sleep issue: I have sent a prescription to your pharmacy. I also recommend trying to sleep hygiene.    If we did any lab work today, and the results require attention, either me or my nurse will get in touch with you. If everything is normal, you will get a letter in mail. If you don't hear from [place] in two weeks, please give [place] a call. Otherwise, we look forward to seeing you again at your next visit. If you have any questions or concerns before then, please call the clinic at [phone].   Please bring all your medications to every doctors visit   Sign up for My Chart to have easy access to your labs results, and communication with your Primary care physician.     Please check-out at the front desk before leaving the clinic.    Portion Size    Choose healthier foods such as 100% whole grains, vegetables, fruits, beans, nut seeds, olive oil, most vegetable oils, fat-free dietary, wild game and fish.   Avoid sweet tea, other sweetened beverages, soda, fruit juice, cold cereal and milk and trans fat.   Eat at least 3 meals and 1-2 snacks per day.  Aim for no more than 5 hours between eating.  Eat breakfast within one hour of getting up.    Exercise at least 150 minutes per week, including weight resistance exercises 3 or 4 times per week.        Sleep Hygiene Adults need slightly less (7-9 hours each night).   11 Tips to Follow: 1. No caffeine after 3pm: Avoid beverages with caffeine (soda, tea, energy drinks, etc.) especially after 3pm.  2. Don't go to bed hungry: Have your evening meal at least 3  hrs. before going to sleep. It's fine to have a small bedtime snack such as a glass of milk and a few crackers but don't have a big meal.  3. Have a nightly routine before bed: Plan on "winding down" before you go to sleep. Begin relaxing about 1 hour before you go to bed. Try doing a quiet activity such as listening to calming music, reading a book or meditating.  4. Turn off the TV and ALL electronics including video games, tablets, laptops, etc. 1 hour before sleep, and keep them out of the bedroom.  5. Turn off your cell phone and all notifications (new email and text alerts) or even better, leave your phone outside your room while you sleep. Studies have shown that a part of your brain continues to respond to certain lights and sounds even while you're still asleep.  6. Make your bedroom quiet, dark and cool. If you can't control the noise, try wearing earplugs or using a fan to block out other sounds.  7. Practice relaxation techniques. Try reading a book or meditating or drain your brain by writing a list of what you need to do the next day.  8. Don't nap unless you feel sick: you'll have a better night's sleep.  9. Don't smoke, or quit if you do. Nicotine, alcohol, and marijuana can  all keep you awake. Talk to your health care provider if you need help with substance use.  10. Most importantly, wake up at the same time every day (or within 1 hour of your usual wake up time) EVEN on the weekends. A regular wake up time promotes sleep hygiene and prevents sleep problems.  11. Reduce exposure to bright light in the last three hours of the day before going to sleep.  Maintaining good sleep hygiene and having good sleep habits lower your risk of developing sleep problems. Getting better sleep can also improve your concentration and alertness. Try the simple steps in this guide. If you still have trouble getting enough rest, make an appointment with your health care provider.

## 2016-10-08 NOTE — Progress Notes (Signed)
Subjective:    Gail Gonzalez is a 55 y.o. old female here with for elevated cholesterol  HPI  Elevated cholesterol: Patient had a lipid panel at [facility] at OB/GYN few weeks ago. She was told her cholesterol is high and got scared. Her triglyceride is elevated to 300's. Her ASVD risk score is 3.5%. She has no history of diabetes or ASCVD in the past. Denies symptoms of claudication or TIA in the past. Her risk factors are smoking and hypertension. She reports eating a lot of fried food, butter, bread, pasta and potatoe  Smoking: She reports 9 cigs a day for 20 yrs. She is interested in quitting smoking. Concern level 8/10. Confidenmce level 7/10. Previously attempted with chantix but didn't succeed. Didn't try nicotine patch. Interested in talking to professional Counsellor. Prefers in person counseling.   Insomnia: reports variable bedtime, between 11 pm and 1 am. Gets up at different time as well. This depends on her work schedule. She is a Forensic psychologist. Sometimes she still feel tired when she wakes up in the morning.   Review of Systems  Constitutional: Negative for appetite change, fatigue, fever and unexpected weight change.  HENT: Negative for trouble swallowing.   Eyes: Negative for visual disturbance.  Respiratory: Negative for cough, chest tightness and shortness of breath.   Cardiovascular: Negative for leg swelling.  Gastrointestinal: Negative for blood in stool.  Genitourinary: Negative for dysuria and hematuria.  Musculoskeletal: Negative for arthralgias and myalgias.  Skin: Negative for rash.  Neurological: Negative for dizziness, numbness and headaches.  Psychiatric/Behavioral: Negative for dysphoric mood. The patient is not nervous/anxious.     History and Problem List: [name] has GERD (gastroesophageal reflux disease); Endometriosis; Thyroid nodule; S/P left breast biopsy; Baker's cyst, unruptured; Duodenal ulcer disease; Vitamin D deficiency; Seborrheic keratosis; Back  pain; Neoplasm of uncertain behavior; Tobacco use disorder; Dyslipidemia; Insomnia; Hyperglycemia; Immunization due; and Preventative health care on her problem list.  [name]  has a past medical history of Anemia; Anxiety; Arthritis; Chronic back pain; Colon polyps (04/24/2013); Diverticulosis (02/2013); Endometriosis; GERD (gastroesophageal reflux disease); Heart murmur; Hemorrhoids; Rectal bleeding; and Thyroid disease (2012).  Immunizations needed: Flu and Tdap     Objective:    BP 99/80 (BP Location: Left Arm, Patient Position: Sitting, Cuff Size: Normal)   Pulse 64   Temp 98.3 F (36.8 C) (Oral)   Ht 5\' 9"  (1.753 m)   Wt 138 lb (62.6 kg)   SpO2 99%   BMI 20.38 kg/m  Physical Exam GEN: appears well, no apparent distress. Oropharynx: mmm without erythema or exudation. Poor denitition HEM: negative for cervical or periauricular lymphadenopathies CVS: RRR, normal s1 and s2, no murmurs, no edema RESP: no increased work of breathing, good air movement bilaterally, no rhonchi, crackles or wheeze GI: Bowel sounds present and normal, soft, non-tender MSK:no focal tenderness or swelling SKIN: no apparent skin lesion NEURO: alertr and oiented appropriately, no gross defecits  PSYCH: appropriate mood and affect     Assessment and Plan:     Teshara was seen today for elevated cholesterol .   Problem List Items Addressed This Visit      Other   Dyslipidemia - Primary    ASCVD risk score 3.4%. Risk factors tobacco use and hypertension. Hypertension well controlled. She has high triglycerides to 300's although that wasn't a fasting lipid panel.  -Will get fasting lipid panel -Discussed life style changes including portion size and exercise. Gave handout -Interested in quitting. Wants to see Counsellor. Likes to wait  on pharmacologic intervention until she talks to [employer]. Patient to return to see Dr. Valentina Lucks      Relevant Medications   traZODone (DESYREL) 50 MG tablet   Other  Relevant Orders   Lipid panel   Hyperglycemia   Relevant Orders   POCT glycosylated hemoglobin (Hb A1C) (Completed)   Immunization due   Relevant Orders   Tdap vaccine greater than or equal to 7yo IM (Completed)   Insomnia    Likely due to variable sleep schedule. Used Ambien as needed in the past. -Discussed sleep hygiene -Gave trazodone 25-50 mg as needed at bed time as needed      Relevant Medications   traZODone (DESYREL) 50 MG tablet   Preventative health care    Declined flu vaccine today. DTaP given today. Had Pap smear at [facility] recently. Will obtain this and other test results      Tobacco use disorder    Interested in quitting. Tried Chantix in the past which didn't work. Likes to wait on pharmacologic intervention until she talks to someone/counselor.  -Patient to return to see Dr. Valentina Lucks         Return for Lab visit and with Dr. Valentina Lucks.  Mercy Riding, MD

## 2016-10-10 DIAGNOSIS — E785 Hyperlipidemia, unspecified: Secondary | ICD-10-CM | POA: Insufficient documentation

## 2016-10-10 DIAGNOSIS — R739 Hyperglycemia, unspecified: Secondary | ICD-10-CM | POA: Insufficient documentation

## 2016-10-10 DIAGNOSIS — F17200 Nicotine dependence, unspecified, uncomplicated: Secondary | ICD-10-CM | POA: Insufficient documentation

## 2016-10-10 DIAGNOSIS — G4700 Insomnia, unspecified: Secondary | ICD-10-CM | POA: Insufficient documentation

## 2016-10-10 DIAGNOSIS — Z23 Encounter for immunization: Secondary | ICD-10-CM | POA: Insufficient documentation

## 2016-10-10 NOTE — Assessment & Plan Note (Addendum)
ASCVD risk score 3.4%. Risk factors tobacco use and hypertension. Hypertension well controlled. She has high triglycerides to 300's although that wasn't a fasting lipid panel.  -Will get fasting lipid panel -Discussed life style changes including portion size and exercise. Gave handout -Interested in quitting. Wants to see Counsellor. Likes to wait on pharmacologic intervention until she talks to [employer]. Patient to return to see Dr. Valentina Lucks

## 2016-10-10 NOTE — Assessment & Plan Note (Signed)
Interested in quitting. Tried Chantix in the past which didn't work. Likes to wait on pharmacologic intervention until she talks to someone/counselor.  -Patient to return to see Dr. Valentina Lucks

## 2016-10-10 NOTE — Assessment & Plan Note (Signed)
Likely due to variable sleep schedule. Used Ambien as needed in the past. -Discussed sleep hygiene -Gave trazodone 25-50 mg as needed at bed time as needed

## 2016-10-11 DIAGNOSIS — Z0000 Encounter for general adult medical examination without abnormal findings: Secondary | ICD-10-CM | POA: Insufficient documentation

## 2016-10-11 NOTE — Assessment & Plan Note (Signed)
Declined flu vaccine today. DTaP given today. Had Pap smear at [facility] recently. Will obtain this and other test results

## 2016-10-14 ENCOUNTER — Other Ambulatory Visit: Payer: BLUE CROSS/BLUE SHIELD

## 2016-10-14 DIAGNOSIS — E785 Hyperlipidemia, unspecified: Secondary | ICD-10-CM

## 2016-10-14 LAB — LIPID PANEL
CHOL/HDL RATIO: 3.9 ratio (ref <5.0)
CHOLESTEROL: 160 mg/dL (ref <200)
HDL: 41 mg/dL — ABNORMAL LOW (ref >50)
LDL CALC: 80 mg/dL (ref <100)
Triglycerides: 195 mg/dL — ABNORMAL HIGH (ref <150)
VLDL: 39 mg/dL — AB (ref <30)

## 2016-10-16 ENCOUNTER — Encounter: Payer: Self-pay | Admitting: Student

## 2016-10-16 NOTE — Progress Notes (Signed)
Lipid panel with high TG. Discussed about portion size and regular exercise

## 2016-10-30 ENCOUNTER — Encounter: Payer: Self-pay | Admitting: Student

## 2016-10-30 ENCOUNTER — Ambulatory Visit (INDEPENDENT_AMBULATORY_CARE_PROVIDER_SITE_OTHER): Payer: BLUE CROSS/BLUE SHIELD | Admitting: Student

## 2016-10-30 VITALS — BP 106/82 | HR 72 | Temp 98.7°F | Ht 69.0 in | Wt 138.2 lb

## 2016-10-30 DIAGNOSIS — B9789 Other viral agents as the cause of diseases classified elsewhere: Secondary | ICD-10-CM

## 2016-10-30 DIAGNOSIS — J069 Acute upper respiratory infection, unspecified: Secondary | ICD-10-CM | POA: Diagnosis not present

## 2016-10-30 NOTE — Patient Instructions (Signed)
It is nice to meet you again.   It appears that you have a viral upper respiratory infection (cold).  Cold symptoms can last up to 2 weeks.    - Get plenty of rest and drink plenty of fluids. - Try to breathe moist air. Use a humidifier or take a steamy shower. - Consume warm fluids (soup or tea) to provide relief for a stuffy nose and to loosen phlegm. - For nasal stuffiness, try saline nasal spray or a Neti Pot. - For sore throat pain relief: suck on throat lozenges, hard candy or popsicles; gargle with warm salt water (1/4 tsp. salt per 8 oz. of water); and eat soft, bland foods. - Eat a well-balanced diet. If you cannot, ensure you are getting enough nutrients by taking a daily multivitamin. - Avoid dairy products, as they can thicken phlegm. - Avoid alcohol, as it impairs your body's immune system.  CONTACT YOUR DOCTOR IF YOU EXPERIENCE ANY OF THE FOLLOWING: - High fever - Ear pain - Sinus-type headache - Unusually severe cold symptoms - Cough that gets worse while other cold symptoms improve - Flare up of any chronic lung problem, such as asthma - Your symptoms persist longer than 2 weeks

## 2016-10-30 NOTE — Progress Notes (Signed)
  Subjective:    Gail Gonzalez is a 56 y.o. old female here for nasal and chest congestion  HPI Nasal and chest congestion: for one week. Runny nose. Denies fever but chills. Reports nausea but denies emesis or diarrhea. Productive cough with yellow-greenish sputum. Denies hemoptysis. Reports some sore throat.  Denies skin rash. Denies new medicine. Tried dayquil that helped. Some shortness of breath. Denies headache, dizziness, chest pain or skin rash.  Sister treated for strep pharyngitis about 10 days ago.   PMH/Problem List: has GERD (gastroesophageal reflux disease); Endometriosis; Thyroid nodule; S/P left breast biopsy; Baker's cyst, unruptured; Duodenal ulcer disease; Vitamin D deficiency; Seborrheic keratosis; Back pain; Neoplasm of uncertain behavior; Tobacco use disorder; Dyslipidemia; Insomnia; Hyperglycemia; Immunization due; and Preventative health care on her problem list.   has a past medical history of Anemia; Anxiety; Arthritis; Chronic back pain; Colon polyps (04/24/2013); Diverticulosis (02/2013); Endometriosis; GERD (gastroesophageal reflux disease); Heart murmur; Hemorrhoids; Rectal bleeding; and Thyroid disease (2012).  Campus Social History  Substance Use Topics  . Smoking status: Current Every Day Smoker    Packs/day: 0.25    Years: 15.00    Types: Cigarettes    Last attempt to quit: 06/10/2012  . Smokeless tobacco: Never Used  . Alcohol use 2.4 oz/week    4 Standard drinks or equivalent per week     Comment: socailly   Review of Systems A 12 point review systems negative except for history of present illness    Objective:     Vitals:   10/30/16 1525  BP: 106/82  Pulse: 72  Temp: 98.7 F (37.1 C)  TempSrc: Oral  SpO2: 98%  Weight: 138 lb 3.2 oz (62.7 kg)  Height: 5\' 9"  (1.753 m)    Physical Exam GEN: appears well, no apparent distress. Head: normocephalic and atraumatic  Eyes: conjunctiva without injection, sclera anicteric Nares: some rhinorrhea,  significant congestion and erythema right > left.  Oropharynx: mmm without erythema or exudation. Transillumination test negative HEM: negative for cervical or periauricular lymphadenopathies.  CVS: RRR, nl s1 & s2, no murmurs, no edema RESP: no increased work of breathing, good air movement bilaterally, no rhonchi, crackles or wheeze GI: Bowel sounds present and normal, soft, non-tender SKIN: no apparent skin lesion ENDO: Thyroid nodal about 2 cm long noted in right anterior cervical triangle NEURO: alert and oiented appropriately, no gross defecits  PSYCH: euthymic mood with congruent affect    Assessment and Plan:  Viral URI with cough: History and exam suggestive for viral URTI. Exam with some rhinorrhea, significant congestion and erythema right > left. One out of 4 on Centor's criteria to think of strep pharyngitis although she has positive contact history. Lung exam normal to suspect LRTI.  -Recommended conservative management including adequate hydration and rest -Discussed return precautions including but not limited to shortness of breath or increased working of breathing, severe persistent cough, persistent fever over 101F, mental status change, not tolerating fluids by mouth or other symptoms concerning to her. Again I recommended quitting smoking.   Mercy Riding, MD 10/30/16 Pager: [phone]

## 2016-12-30 ENCOUNTER — Other Ambulatory Visit: Payer: Self-pay | Admitting: Student

## 2016-12-30 DIAGNOSIS — K219 Gastro-esophageal reflux disease without esophagitis: Secondary | ICD-10-CM

## 2017-03-05 ENCOUNTER — Other Ambulatory Visit: Payer: Self-pay | Admitting: Student

## 2017-03-05 DIAGNOSIS — G4700 Insomnia, unspecified: Secondary | ICD-10-CM

## 2017-03-05 DIAGNOSIS — E785 Hyperlipidemia, unspecified: Secondary | ICD-10-CM

## 2017-04-06 ENCOUNTER — Other Ambulatory Visit: Payer: Self-pay | Admitting: Student

## 2017-04-06 DIAGNOSIS — E785 Hyperlipidemia, unspecified: Secondary | ICD-10-CM

## 2017-04-06 DIAGNOSIS — G4700 Insomnia, unspecified: Secondary | ICD-10-CM

## 2017-04-06 DIAGNOSIS — K219 Gastro-esophageal reflux disease without esophagitis: Secondary | ICD-10-CM

## 2017-07-05 ENCOUNTER — Other Ambulatory Visit: Payer: Self-pay | Admitting: Student

## 2017-07-05 DIAGNOSIS — K219 Gastro-esophageal reflux disease without esophagitis: Secondary | ICD-10-CM

## 2017-07-15 ENCOUNTER — Other Ambulatory Visit: Payer: Self-pay | Admitting: Student

## 2017-07-15 DIAGNOSIS — E785 Hyperlipidemia, unspecified: Secondary | ICD-10-CM

## 2017-07-15 DIAGNOSIS — G4700 Insomnia, unspecified: Secondary | ICD-10-CM

## 2017-08-22 IMAGING — US US THYROID
1 series · 13 of 25 positions shown · non-contrast
Comparison: 11/21/2014

CLINICAL DATA: Goiter.

EXAM:
THYROID ULTRASOUND
TECHNIQUE: Ultrasound examination of the thyroid gland and adjacent soft
tissues was performed.

[Series 1: us thyroid · 0.06mm/px · 13 of 87 slices shown]
[im 1/87]
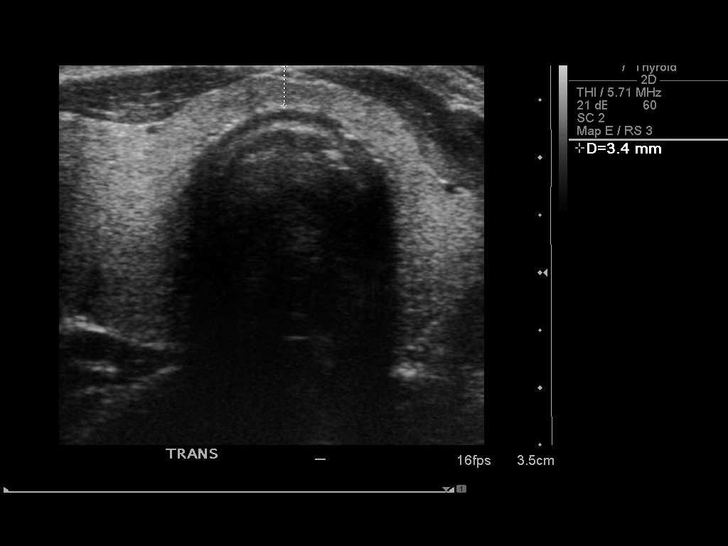
[im 8/87]
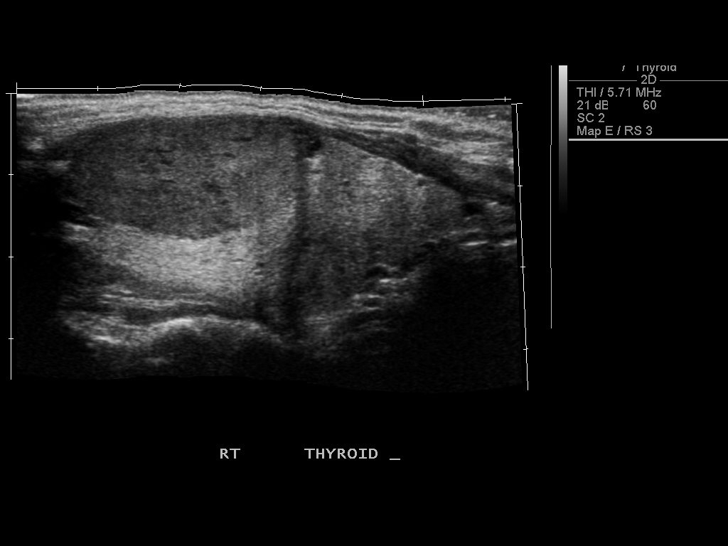
[im 15/87]
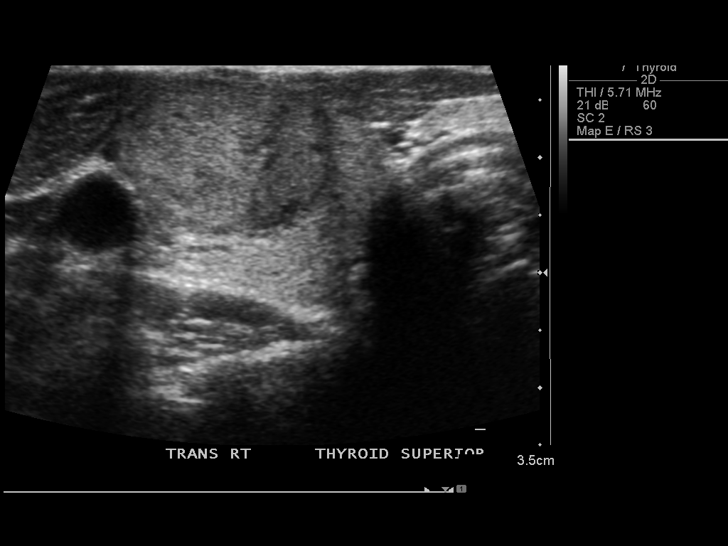
[im 22/87]
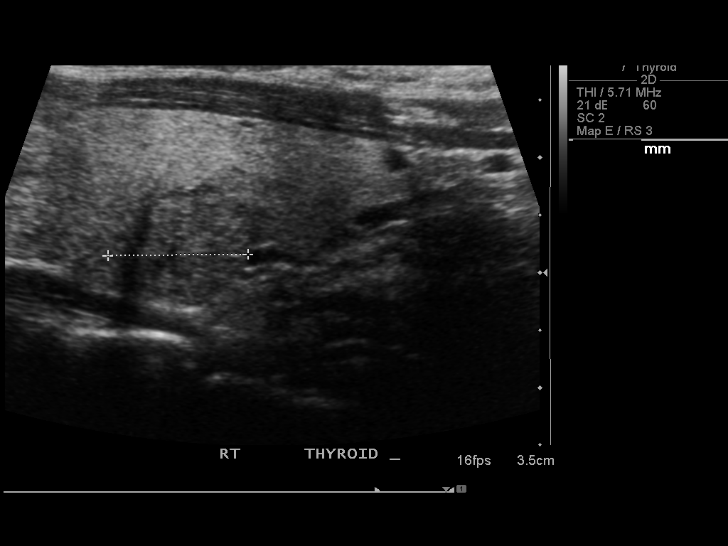
[im 29/87]
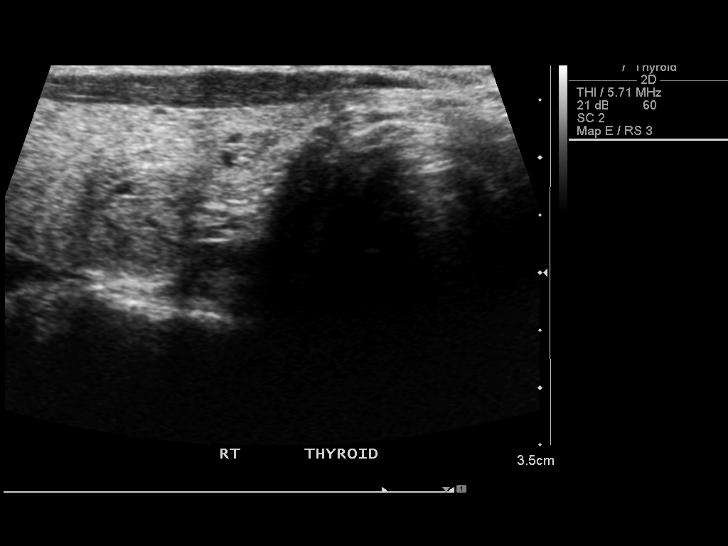
[im 36/87]
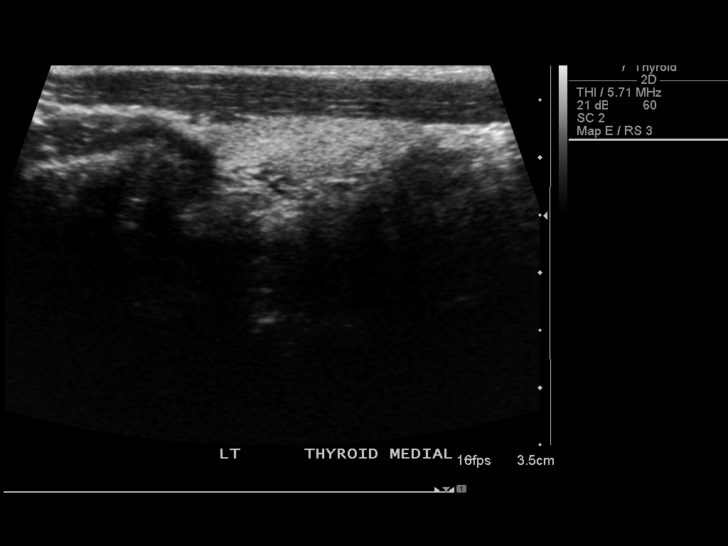
[im 44/87]
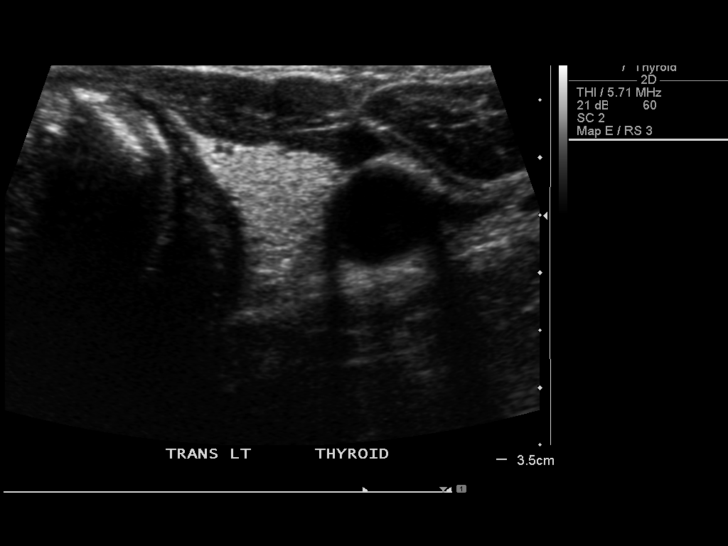
[im 51/87]
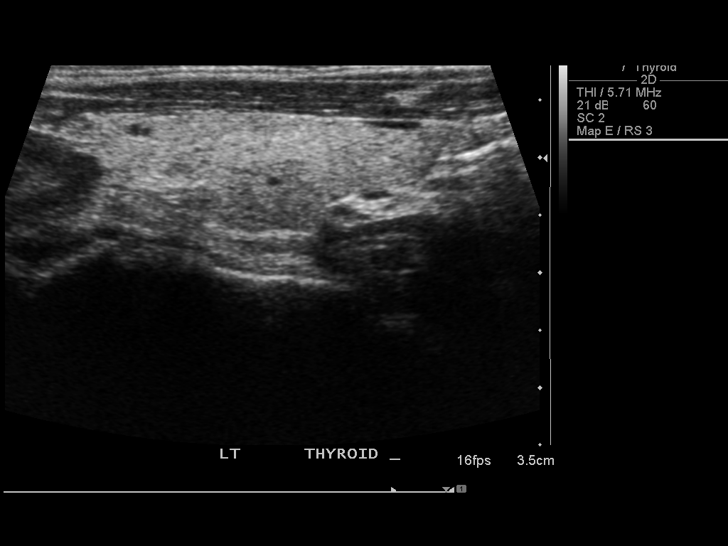
[im 58/87]
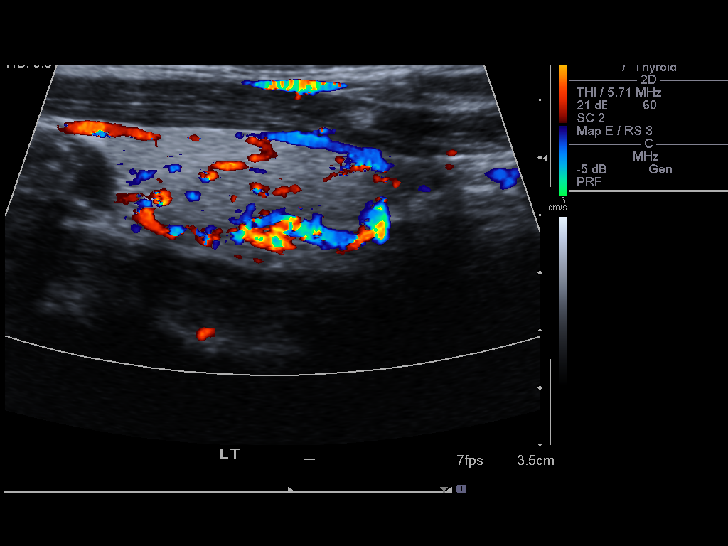
[im 65/87]
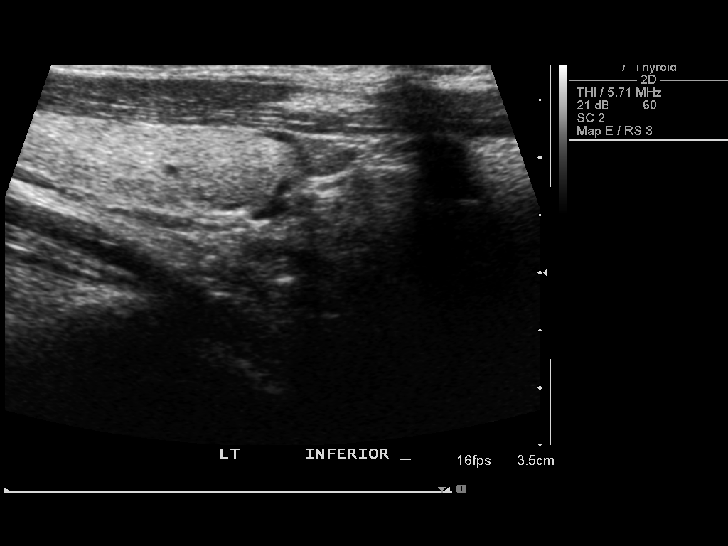
[im 72/87]
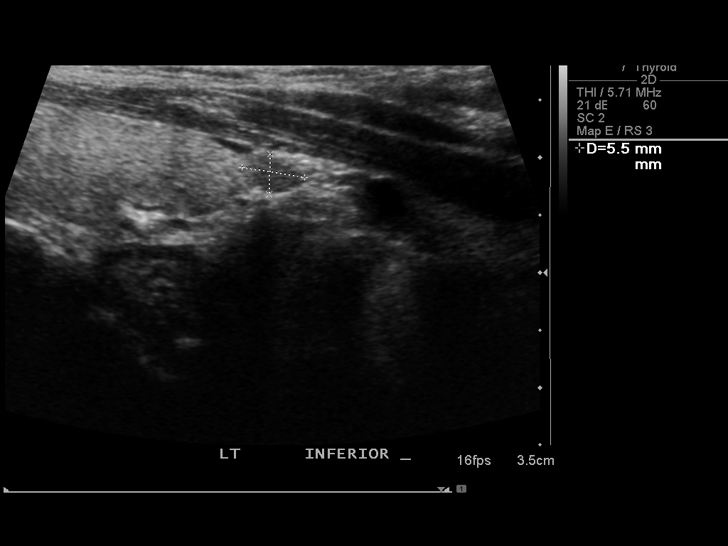
[im 79/87]
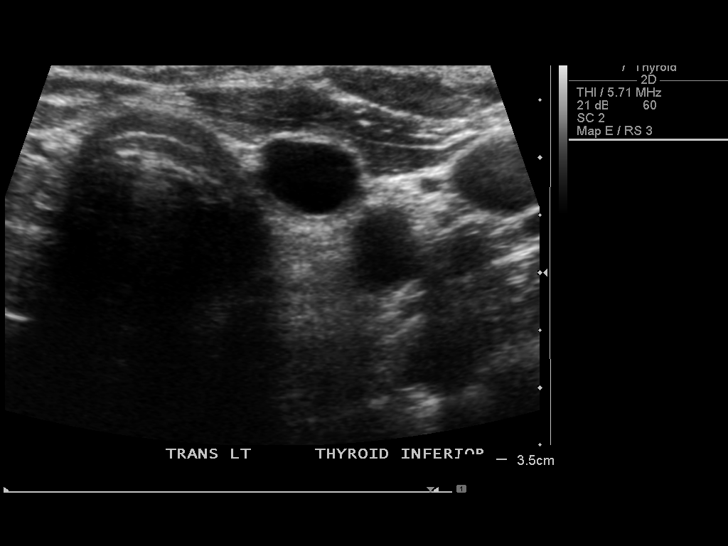
[im 87/87]
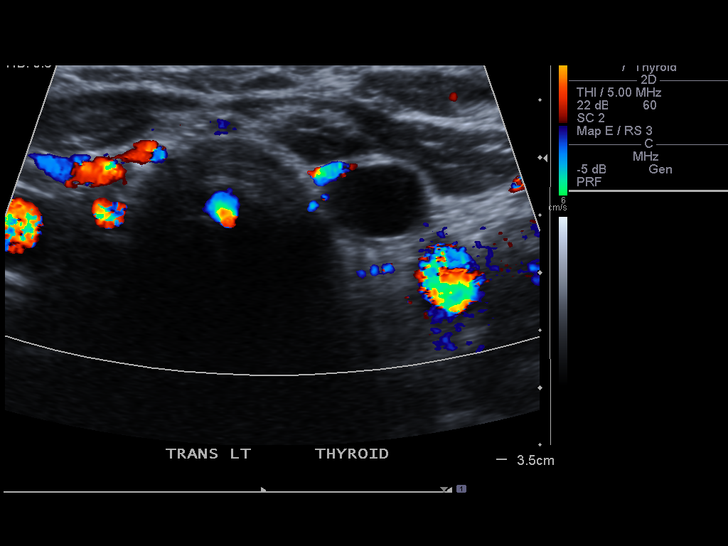

[13 of 25 positions shown; findings below may reference images not displayed]

FINDINGS: Parenchymal Echotexture: Mildly heterogenous

Estimated total number of nodules >/= 1 cm: 2

Number of spongiform nodules >/=  2 cm not described below (TR1): 0

Number of mixed cystic and solid nodules >/= 1.5 cm not described
below (TR2): 0

_________________________________________________________

Isthmus: 0.3 cm, previously 0.3 cm

No discrete nodules are identified within the thyroid isthmus.

_________________________________________________________

Right lobe: 5.1 x 2.2 x 2.7 cm, previously 5.8 x 1.8 x 2.2 cm

Nodule # 1:

Prior biopsy: No

Location: Right; Superior

Size: 3.3 x 1.7 x 2.1 cm, previously 2.7 x 1.2 x 2.0 cm

Composition: solid/almost completely solid (2)

Echogenicity: hypoechoic (2)

Shape: not taller-than-wide (0)

Margins: smooth (0)

Echogenic foci: none (0)

ACR TI-RADS total points: 4.

ACR TI-RADS risk category: TR4 (4-6 points).

Change in features: No

Change in ACR TI-RADS risk category: No

ACR TI-RADS recommendations:

This nodule has enlarged.  Fine-needle aspiration is recommended.

Right lower pole similar appearing nodule measures 1.2 x 1.0 x
cm and previously measured 0.7 cm. This has enlarged.

_________________________________________________________

Left lobe: 4.5 x 1.3 x 1.3 cm, previously 3.8 x 1.6 x 1.3 cm

No thyroid nodules. There is a benign appearing cyst in the lower
pole measuring 1.1 cm.
IMPRESSION: Two right-sided thyroid nodules have both enlarged. The dominant
nodule measures 3.3 cm on today's study and previously measured
cm. Fine-needle aspiration is recommended.

The above is in keeping with the ACR TI-RADS recommendations - [HOSPITAL] 4307;[DATE].

## 2017-10-10 ENCOUNTER — Other Ambulatory Visit: Payer: Self-pay | Admitting: Student

## 2017-10-10 DIAGNOSIS — K219 Gastro-esophageal reflux disease without esophagitis: Secondary | ICD-10-CM

## 2017-11-18 ENCOUNTER — Encounter: Payer: Self-pay | Admitting: Student

## 2017-11-18 DIAGNOSIS — D259 Leiomyoma of uterus, unspecified: Secondary | ICD-10-CM | POA: Insufficient documentation

## 2017-12-17 ENCOUNTER — Encounter: Payer: Self-pay | Admitting: Student

## 2018-02-05 ENCOUNTER — Other Ambulatory Visit: Payer: Self-pay | Admitting: Student

## 2018-02-05 DIAGNOSIS — K219 Gastro-esophageal reflux disease without esophagitis: Secondary | ICD-10-CM

## 2018-06-04 ENCOUNTER — Other Ambulatory Visit: Payer: Self-pay

## 2018-06-04 DIAGNOSIS — K219 Gastro-esophageal reflux disease without esophagitis: Secondary | ICD-10-CM

## 2018-06-04 MED ORDER — OMEPRAZOLE 20 MG PO CPDR: 20.0000 mg | DELAYED_RELEASE_CAPSULE | Freq: Every day | ORAL | 0 refills | Status: DC

## 2018-09-10 ENCOUNTER — Other Ambulatory Visit: Payer: Self-pay | Admitting: Family Medicine

## 2018-09-10 DIAGNOSIS — K219 Gastro-esophageal reflux disease without esophagitis: Secondary | ICD-10-CM

## 2019-11-22 ENCOUNTER — Ambulatory Visit (INDEPENDENT_AMBULATORY_CARE_PROVIDER_SITE_OTHER): Payer: 59 | Admitting: Family Medicine

## 2019-11-22 ENCOUNTER — Encounter: Payer: Self-pay | Admitting: Family Medicine

## 2019-11-22 ENCOUNTER — Other Ambulatory Visit: Payer: Self-pay

## 2019-11-22 VITALS — BP 98/60 | HR 92 | Temp 98.1°F | Wt 132.0 lb

## 2019-11-22 DIAGNOSIS — F17200 Nicotine dependence, unspecified, uncomplicated: Secondary | ICD-10-CM | POA: Diagnosis not present

## 2019-11-22 DIAGNOSIS — Z0000 Encounter for general adult medical examination without abnormal findings: Secondary | ICD-10-CM | POA: Diagnosis not present

## 2019-11-22 DIAGNOSIS — K269 Duodenal ulcer, unspecified as acute or chronic, without hemorrhage or perforation: Secondary | ICD-10-CM | POA: Diagnosis not present

## 2019-11-22 DIAGNOSIS — E041 Nontoxic single thyroid nodule: Secondary | ICD-10-CM | POA: Diagnosis not present

## 2019-11-22 DIAGNOSIS — E785 Hyperlipidemia, unspecified: Secondary | ICD-10-CM

## 2019-11-22 NOTE — Progress Notes (Signed)
Subjective:    Gail Gonzalez - 59 y.o. female MRN [id number]  Date of birth: [birth year]  CC:  Gail Gonzalez is here for an annual physical.  HPI: Thyroid nodule -sees [facility] endocrinology, last visit was three years ago -has not noticed any change in size although she says she had more nodules than before at her last endocrinology visit -is supposed to have thyroid US done once yearly, last one was several years -weight has decreased, appetite is good, stays active -no palpitations  GI issues -nausea in response to fatty foods, occurs frequently -BMs mostly normal but will get diarrhea after eating heavy foods -has a history of ulcers and esophageal stricture that needed dilation -normal colonoscopy within last 10 years  Substance use -tobacco smoke - 1/3 pack per day, quit cold [place] for 8 years, Chantix did not help her -alcohol use - four 6-oz glasses of whiskey about once per week     Health Maintenance:  -getting pap and mammogram soon, ordered through OB/GYN -will be seeing a dentist soon Health Maintenance Due  Topic Date Due  . PAP SMEAR-Modifier  10/07/2015  . MAMMOGRAM  08/01/2016    -  reports that she has been smoking cigarettes. She has a 3.75 pack-year smoking history. She has never used smokeless tobacco. - Review of Systems: Per HPI. - Past Medical History: Patient Active Problem List   Diagnosis Date Noted  . Uterine leiomyoma 11/18/2017  . Preventative health care 10/11/2016  . Tobacco use disorder 10/10/2016  . Dyslipidemia 10/10/2016  . Insomnia 10/10/2016  . Hyperglycemia 10/10/2016  . Immunization due 10/10/2016  . 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 Seborrheic keratosis 03/06/2016  . Back pain 03/06/2016  . Vitamin D deficiency 06/11/2015  . 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 Baker's cyst, unruptured 12/30/2012  . GERD (gastroesophageal reflux disease) 07/29/2012  . Endometriosis 07/29/2012  . Thyroid nodule 07/29/2012   . S/P left breast biopsy 07/29/2012   - Medications: reviewed and updated   Objective:   Physical Exam BP 98/60   Pulse 92   Temp 98.1 F (36.7 C) (Oral)   Wt 132 lb (59.9 kg)   SpO2 90%   BMI 19.49 kg/m  Gen: NAD, alert, cooperative with exam, well-appearing HEENT: NCAT, PERRL, clear conjunctiva, oropharynx clear, thyroid nodule visualized and palpated on right side of thyroid CV: RRR, good S1/S2, no murmur, no edema Resp: CTABL, no wheezes, non-labored Abd: SNTND, BS present, no guarding or organomegaly Skin: no rashes, normal turgor  Neuro: no gross deficits.  Psych: good insight, alert and oriented        Assessment & Plan:   Duodenal ulcer disease Given patient's GI symptoms as well as her history of duodenal ulcer and esophageal stricture, will place referral to GI today.  She is up-to-date on her colonoscopy.  Thyroid nodule Referral placed to a [facility] based endocrinologist today per patient request.  Will order thyroid ultrasound to monitor thyroid nodule and check a TSH.  Dyslipidemia Will obtain a lipid panel today.  Preventative health care Will obtain a CMP today.  We will also obtain a low-dose chest CT to screen for lung cancer due to patient's significant smoking history and age.  Patient was counseled on the maximum amount of alcohol recommended for females, which would be 7 total ounces of liquor per week and encouraged to cut down on her alcohol use.  Tobacco use disorder Patient is in the contemplative stage of tobacco cessation.  Patient encouraged  to call the 1 800 quit now line and informed about our in-house tobacco cessation clinic.  She was encouraged to make an appointment with them if she would like.  We also discussed the possibility of starting Wellbutrin, which may help her anxiety as well as quit attempts.    Maia Breslow, M.D. 11/23/2019, 7:05 AM PGY-3, [place]

## 2019-11-22 NOTE — Patient Instructions (Signed)
It was nice meeting you today Ms. Blumenshine!  Today I am ordering several blood tests to check your cholesterol, thyroid, kidneys, liver, and blood count.  We are also ordering a thyroid ultrasound and a screening chest CT.  I have placed referrals for GI and endocrinology today.  He did not hear anything from their office please let us know in about 2 weeks.  If you are interested, we have a dedicated tobacco cessation clinic here.  I would also recommend calling the 1 800 quit now line and thinking about starting a medication called Wellbutrin which can help you quit as well.  If you have any questions or concerns, please feel free to call the clinic.   Be well,  Dr. Shan Levans

## 2019-11-23 LAB — COMPREHENSIVE METABOLIC PANEL
ALT: 13 IU/L (ref 0–32)
AST: 17 IU/L (ref 0–40)
Albumin/Globulin Ratio: 1.7 (ref 1.2–2.2)
Albumin: 4.3 g/dL (ref 3.8–4.9)
Alkaline Phosphatase: 64 IU/L (ref 39–117)
BUN/Creatinine Ratio: 14 (ref 9–23)
BUN: 11 mg/dL (ref 6–24)
Bilirubin Total: 0.5 mg/dL (ref 0.0–1.2)
CO2: 25 mmol/L (ref 20–29)
Calcium: 9.5 mg/dL (ref 8.7–10.2)
Chloride: 106 mmol/L (ref 96–106)
Creatinine, Ser: 0.77 mg/dL (ref 0.57–1.00)
GFR calc Af Amer: 98 mL/min/{1.73_m2} (ref >59)
GFR calc non Af Amer: 85 mL/min/{1.73_m2} (ref >59)
Globulin, Total: 2.5 g/dL (ref 1.5–4.5)
Glucose: 88 mg/dL (ref 65–99)
Potassium: 4.5 mmol/L (ref 3.5–5.2)
Sodium: 144 mmol/L (ref 134–144)
Total Protein: 6.8 g/dL (ref 6.0–8.5)

## 2019-11-23 LAB — LIPID PANEL
Chol/HDL Ratio: 3.3 ratio (ref 0.0–4.4)
Cholesterol, Total: 179 mg/dL (ref 100–199)
HDL: 54 mg/dL (ref >39)
LDL Chol Calc (NIH): 91 mg/dL (ref 0–99)
Triglycerides: 204 mg/dL — ABNORMAL HIGH (ref 0–149)
VLDL Cholesterol Cal: 34 mg/dL (ref 5–40)

## 2019-11-23 LAB — TSH: TSH: 0.572 u[IU]/mL (ref 0.450–4.500)

## 2019-11-23 NOTE — Assessment & Plan Note (Signed)
Will obtain a lipid panel today.

## 2019-11-23 NOTE — Assessment & Plan Note (Signed)
Referral placed to a [facility] based endocrinologist today per patient request.  Will order thyroid ultrasound to monitor thyroid nodule and check a TSH.

## 2019-11-23 NOTE — Assessment & Plan Note (Addendum)
Given patient's GI symptoms as well as her history of duodenal ulcer and esophageal stricture, will place referral to GI today.  She is up-to-date on her colonoscopy.

## 2019-11-23 NOTE — Assessment & Plan Note (Signed)
Will obtain a CMP today.  We will also obtain a low-dose chest CT to screen for lung cancer due to patient's significant smoking history and age.  Patient was counseled on the maximum amount of alcohol recommended for females, which would be 7 total ounces of liquor per week and encouraged to cut down on her alcohol use.

## 2019-11-23 NOTE — Assessment & Plan Note (Signed)
Patient is in the contemplative stage of tobacco cessation.  Patient encouraged to call the 1 800 quit now line and informed about our in-house tobacco cessation clinic.  She was encouraged to make an appointment with them if she would like.  We also discussed the possibility of starting Wellbutrin, which may help her anxiety as well as quit attempts.

## 2019-11-24 ENCOUNTER — Encounter: Payer: Self-pay | Admitting: Family Medicine

## 2019-11-28 ENCOUNTER — Ambulatory Visit (HOSPITAL_COMMUNITY): Payer: 59

## 2019-12-01 ENCOUNTER — Ambulatory Visit (HOSPITAL_COMMUNITY): Payer: 59

## 2019-12-05 ENCOUNTER — Ambulatory Visit (HOSPITAL_COMMUNITY): Payer: 59

## 2019-12-06 ENCOUNTER — Other Ambulatory Visit: Payer: Self-pay

## 2019-12-06 ENCOUNTER — Ambulatory Visit (HOSPITAL_COMMUNITY)
Admission: RE | Admit: 2019-12-06 | Discharge: 2019-12-06 | Disposition: A | Discharge status: Home / Self Care | Payer: 59 | Source: Ambulatory Visit | Attending: Family Medicine | Admitting: Family Medicine

## 2019-12-06 DIAGNOSIS — F17200 Nicotine dependence, unspecified, uncomplicated: Secondary | ICD-10-CM | POA: Insufficient documentation

## 2019-12-07 NOTE — Progress Notes (Signed)
Please let Gail Gonzalez know that her lung CT appeared normal.  She should continue to get this screening each year.  Thank you!

## 2019-12-08 ENCOUNTER — Telehealth: Payer: Self-pay | Admitting: *Deleted

## 2019-12-08 NOTE — Telephone Encounter (Signed)
-----   Message from Kathrene Alu, MD sent at 12/07/2019  3:07 PM EST ----- Please let Ms. Mankowski know that her lung CT appeared normal.  She should continue to get this screening each year.  Thank you!

## 2019-12-08 NOTE — Telephone Encounter (Signed)
LVM to call office to inform her of below. Carol Theys Zimmerman Rumple, CMA  

## 2019-12-12 ENCOUNTER — Ambulatory Visit (HOSPITAL_COMMUNITY): Payer: 59

## 2019-12-19 ENCOUNTER — Telehealth: Payer: Self-pay | Admitting: *Deleted

## 2019-12-19 NOTE — Telephone Encounter (Signed)
Pt is requesting something to help with her menopause symptoms.  She states that she is irritable and has way too many hot flashes. Christen Bame, CMA

## 2019-12-19 NOTE — Telephone Encounter (Signed)
Pt informed.  Gail Gonzalez, CMA  

## 2019-12-19 NOTE — Telephone Encounter (Signed)
I would like to discuss these options with Gail Gonzalez in person since medications for menopausal symptoms have side effects, and I would want to make sure that we are choosing the best one for her.  I would be happy to see her in clinic as soon as she would like to make an appointment.

## 2019-12-22 NOTE — Telephone Encounter (Signed)
Pt informed of below and she will call to schedule sometime soon.Gail Gonzalez, CMA

## 2020-01-02 ENCOUNTER — Ambulatory Visit: Payer: 59 | Admitting: Internal Medicine

## 2020-07-13 ENCOUNTER — Telehealth: Payer: Self-pay

## 2020-07-13 NOTE — Telephone Encounter (Signed)
Patient calls nurse line requesting refill on Xanax. Patient states that she has recently reestablished care with our practice back in January and was previously receiving Xanax from [facility] Provider. Informed patient that provider was unlikely to send in medication without appointment. Scheduled patient for next Thursday with new PCP to discuss anxiety.   To PCP  Talbot Grumbling, RN

## 2020-07-19 ENCOUNTER — Other Ambulatory Visit: Payer: Self-pay

## 2020-07-19 ENCOUNTER — Ambulatory Visit (INDEPENDENT_AMBULATORY_CARE_PROVIDER_SITE_OTHER): Payer: 59 | Admitting: Family Medicine

## 2020-07-19 ENCOUNTER — Encounter: Payer: Self-pay | Admitting: Family Medicine

## 2020-07-19 VITALS — BP 100/72 | HR 98 | Wt 140.6 lb

## 2020-07-19 DIAGNOSIS — K219 Gastro-esophageal reflux disease without esophagitis: Secondary | ICD-10-CM

## 2020-07-19 DIAGNOSIS — F419 Anxiety disorder, unspecified: Secondary | ICD-10-CM | POA: Diagnosis not present

## 2020-07-19 DIAGNOSIS — Z8659 Personal history of other mental and behavioral disorders: Secondary | ICD-10-CM | POA: Diagnosis not present

## 2020-07-19 MED ORDER — HYDROXYZINE HCL 10 MG PO TABS: 5.0000 mg | ORAL_TABLET | Freq: Three times a day (TID) | ORAL | 0 refills | Status: DC | PRN

## 2020-07-19 MED ORDER — ESCITALOPRAM OXALATE 5 MG PO TABS: 5.0000 mg | ORAL_TABLET | Freq: Every day | ORAL | 1 refills | Status: DC

## 2020-07-19 NOTE — Patient Instructions (Addendum)
Thank you for coming to see me today. It was a pleasure.   Atarax 5 mg at night.  You can take this medication 3 times a day as needed.  Lexapro 5 mg take one tablet daily.  Please follow-up with Oct 21 at [place] me every Monday evening until your next visit.  Expect a call from CCM to make an appointment to discuss anxiety management  If you have any questions or concerns, please do not hesitate to call the office at [phone].  Best,   Carollee Leitz, [place] Providers (No Insurance required or Self Pay)  [facility]  [address] [place], [place] Crisis [phone]  [facility]) can see uninsured folks for outpatient therapy [email or web address] [address] [place], [place] [address] [phone]  [place] Mon-Fri, 8am-3pm [email or web address], [place], [place]   [place] [phone] [place] [facility] for psych med management, there may be a wait- if MHA is working with clients for OPT, they will coordinate with [place] for [place]   Walk-in-Clinic: Monday- Friday 9:00 AM - 4:00 PM [place], [place] [phone]  Family Services of the [place] ([employer]) walk in M-F 8am-12pm and  1pm-3pm [place]  [place]  Phone: [phone]  [employer] ([place] and substance challenges) [address], [place] [phone]    [email or web address] of the [place], [place]     Phone:  [phone] [place]  [place]  [place]  [phone] [email or web address]   Strong  Minds Strong Communities ( virtual or zoom therapy) strongminds@uncg .edu  [place]  [place]    [facility] [phone]  grief counseling, dementia and caregiver support    Alcohol & Drug Services Walk-in MWF 12:30 to 3:00     [place] [address]  [phone]  www.ADSyes.org call to schedule an appointment    [place] ,Support group, Peer support services, [address] [place], [place] [address] 336- [phone]  http://www.kerr.[email or web address]/           National Alliance on Mental Illness (NAMI) [place]- Wellness classes, Support groups        [address], [place], [place] [address] [phone]   CurrentJokes.cz   [facility]  (Psycho-social Rehabilitation clubhouse, Individual and group therapy) [address],  [address]   [phone]  24- Hour Availability:  *[place] or [phone] * Family Service of the [employer] (Domestic Violence, Rape, etc. )[phone] [place] [phone] or [phone] * [place] [phone] only) [phone] (after hours) *Therapeutic Alternative Mobile Crisis Unit [phone] *[place] National Suicide Hotline 430-708-4600 Diamantina Monks)

## 2020-07-20 ENCOUNTER — Telehealth: Payer: Self-pay | Admitting: *Deleted

## 2020-07-20 NOTE — Chronic Care Management (AMB) (Signed)
  Care Management   Note  07/20/2020 Name: Billiejean Schimek MRN: [id number] DOB: 1961-01-17  Maple Odaniel is a 59 y.o. year old female who is a primary care patient of Carollee Leitz, MD. I reached out to Pricilla Loveless by phone today in response to a referral sent by Ms. Hassan Rowan Waldvogel's health plan.    Ms. Brierley was given information about care management services today including:  1. Care management services include personalized support from designated clinical staff supervised by her physician, including individualized plan of care and coordination with other care providers 2. 24/7 contact phone numbers for assistance for urgent and routine care needs. 3. The patient may stop care management services at any time by phone call to the office staff.  Patient agreed to services and verbal consent obtained.   Follow up plan: Telephone appointment with care management team member scheduled for: 07/26/2020  [place] Management

## 2020-07-23 DIAGNOSIS — Z8659 Personal history of other mental and behavioral disorders: Secondary | ICD-10-CM | POA: Insufficient documentation

## 2020-07-23 MED ORDER — OMEPRAZOLE 20 MG PO CPDR: DELAYED_RELEASE_CAPSULE | ORAL | 0 refills | Status: DC

## 2020-07-23 NOTE — Assessment & Plan Note (Addendum)
Discussed with patient the risks of continuing Xanax in decrease in memory, increase in falls. Patient in aggreement to stop medication and will to try different medications. PHQ score 7 and GAD 12 reviewed. -Will start Lexapro 5 mg daily -Atarax 5 mg qhs, can take 3 times a day if needed or increase to 10 mg. Max 30 mg daily  -CCM referral to help with stress management -Patient will call me on Mondays to let me know how she is doing given that next available appointment with me is Oct 21.

## 2020-07-23 NOTE — Progress Notes (Addendum)
    SUBJECTIVE:   CHIEF COMPLAINT / HPI: refill Xanax  Patient reports history of anxiety.  She was previously prescribed Xanax that she used periodically.  Last refilled 05/21.  She reports panic attack last week.  She reports lots of stress at home and at work.  She has never had therapy.  She did try Lexapro for 1 week but stopped.  Denies any SI/HI and no history of attempt.  PERTINENT  PMH / PSH:  H/O Anxiety GERD  OBJECTIVE:   BP 100/72   Pulse 98   Wt 140 lb 9.6 oz (63.8 kg)   SpO2 98%   BMI 20.76 kg/m    General: Alert and oriented, no apparent distress  Cardiovascular: RRR with no murmurs noted Respiratory: CTA bilaterally   Psych: Behavior and speech appropriate to situation  ASSESSMENT/PLAN:   History of anxiety disorder Discussed with patient the risks of continuing Xanax in decrease in memory, increase in falls. Patient in aggreement to stop medication and will to try different medications. PHQ score 7 and GAD 12 reviewed. -Will start Lexapro 5 mg daily -Atarax 5 mg qhs, can take 3 times a day if needed or increase to 10 mg. Max 30 mg daily  -CCM referral to help with stress management -Patient will call me on Mondays to let me know how she is doing given that next available appointment with me is Oct 21.   Health records sent for from [facility] and [place] for health maintenance.  Carollee Leitz, MD [place]

## 2020-07-24 ENCOUNTER — Telehealth: Payer: 59

## 2020-07-26 ENCOUNTER — Telehealth: Payer: 59

## 2020-07-27 ENCOUNTER — Ambulatory Visit: Payer: 59 | Admitting: Licensed Clinical Social Worker

## 2020-07-27 DIAGNOSIS — F419 Anxiety disorder, unspecified: Secondary | ICD-10-CM

## 2020-07-27 DIAGNOSIS — F439 Reaction to severe stress, unspecified: Secondary | ICD-10-CM

## 2020-07-27 NOTE — Patient Instructions (Signed)
  Gail Gonzalez  it was nice speaking with you. Please call me directly if you have questions about the goals we discussed.  Goals Addressed            This Visit's Progress   . Manage My Stress       Follow Up Date -none scheduled   . Continue with medication as prescribed by PCP . Keep F/U appointment with PCP 08/16/2020 . - practice relaxation or meditation daily  . Review EMMI educational video on Breathing to Relax and  Relieving Stress   Why is this important?   When you are stressed, down or upset, your body reacts too.  For example, your blood pressure may get higher; you may have a headache or stomachache.  When your emotions get the best of you, your body's ability to fight off cold and flu gets weak.  These steps will help you manage your emotions.     therapeutic interventions provided - active listening utilized - counseling provided - current coping strategies identified - mindfulness encouraged - relaxation techniques promoted    Ms. Gail Gonzalez received Care Management services today:  1. Care Management services include personalized support from designated clinical staff supervised by her physician, including individualized plan of care and coordination with other care providers 2. 24/7 contact [phone] for assistance for urgent and routine care needs. 3. Care Management are voluntary services and be declined at any time by calling the office.   Patient verbalizes understanding of instructions provided today.  Follow up plan: Client will call office as needed  Gail Gonzalez, [place]

## 2020-07-27 NOTE — Chronic Care Management (AMB) (Signed)
Care Management   Clinical Social Work initial Note  07/27/2020 Name: Gail Gonzalez MRN: [id number] DOB: 11-28-1960 Gail Gonzalez is a 59 y.o. year old female who sees Carollee Leitz, MD for primary care. The Care Management team was consulted by PCP to assist the patient with managing stressors.  LCSW reached out to Gail Gonzalez today by phone to introduce self, assess needs and barriers to care.    Assessment: Patient is pleasant and engaged in conversation.  Experiencing symptoms of anxiety which seems to be exacerbated by increased stress.  Patient admits that things have calm down a bit with work deadlines and she is better able to manage.       Plan:  1. Patient does not desire ongoing outreach from LCSW 2.  Patient will implement the interventions discussed and call LCSW as needed  Review of patient status, including review of consultants reports, relevant laboratory and other test results, and collaboration with appropriate care team members and the patient's provider was performed as part of comprehensive patient evaluation and provision of chronic care management services.    Advance Directive Status:  Not addressed in this encounter SDOH (Social Determinants of Health) assessments performed: Yes: SDOH Interventions     Most Recent Value  SDOH Interventions  Stress Interventions Provide Counseling      ; Goals Addressed            This Visit's Progress   . Manage My Stress       Follow Up Date -none scheduled   . Continue with medication as prescribed by PCP . Keep F/U appointment with PCP 08/16/2020 . - practice relaxation or meditation daily  . Review EMMI educational video on Breathing to Relax and  Relieving Stress   Why is this important?   When you are stressed, down or upset, your body reacts too.  For example, your blood pressure may get higher; you may have a headache or stomachache.  When your emotions get the best of you, your body's ability to fight off cold  and flu gets weak.  These steps will help you manage your emotions.     therapeutic interventions provided - active listening utilized - counseling provided - current coping strategies identified - mindfulness encouraged - relaxation techniques promoted      Outpatient Encounter Medications as of 07/27/2020  Medication Sig  . escitalopram (LEXAPRO) 5 MG tablet Take 1 tablet (5 mg total) by mouth daily.  . hydrOXYzine (ATARAX/VISTARIL) 10 MG tablet Take 0.5 tablets (5 mg total) by mouth 3 (three) times daily as needed.  . Multiple Vitamin (MULTIVITAMIN) capsule Take 1 capsule by mouth daily.  Marland Kitchen omeprazole (PRILOSEC) 20 MG capsule TAKE 1 CAPSULE(20 MG) BY MOUTH DAILY  . sodium chloride (OCEAN) 0.65 % SOLN nasal spray Place 1 spray into both nostrils as needed for congestion.   No facility-administered encounter medications on file as of 07/27/2020.       Information about Care Management services was shared with Gail Gonzalez today including:  1. Care Management services include personalized support from designated clinical staff supervised by her physician, including individualized plan of care and coordination with other care providers 2. Remind patient of 24/7 contact phone numbers to provider's office for assistance with urgent and routine care needs. 3. Care Management services are voluntary and patient may stop at any time .  Patient agreed to services provided today.       Casimer Lanius, [place]  Family Medicine / [place]   [phone] 3:18 PM

## 2020-08-13 NOTE — Patient Instructions (Addendum)
Thank you for coming to see me today. It was a pleasure.  We increased your Lexapro to 10 mg daily Atarax as needed  Please follow-up with me in 1 months  If you have any questions or concerns, please do not hesitate to call the office at [phone].  Best,   Carollee Leitz, [place] Providers (No Insurance required or Self Pay)  [facility]  [address] [place], [place] Crisis [phone]  [facility]) can see uninsured folks for outpatient therapy [email or web address] [address] [place], [place] [address] [phone]  [place] Mon-Fri, 8am-3pm [email or web address], [place], [place]   [place] [phone]- [facility] for psych med management, there may be a wait- if MHA is working with clients for OPT, they will coordinate with [place] for [place]   Walk-in-Clinic: Monday- Friday 9:00 AM - 4:00 PM [place], [place] [phone]  [facility] Services of the [place] ([employer]) walk in M-F 8am-12pm and  1pm-3pm [place]  [place]  Phone: [phone]  [employer] ([place] and substance challenges) [address], [place] [phone]    [email or web address] of the [place], [place]     Phone:  [phone] [place]  [place]  [place]  [phone] [email or web address].gl   Strong Minds Strong Communities ( virtual or zoom therapy) strongminds@uncg .edu  [place]  [place]    [facility] [phone]  grief counseling, dementia and  caregiver support    Alcohol & Drug Services Walk-in MWF 12:30 to 3:00     [place] [address]  [phone]  www.ADSyes.org call to schedule an appointment    [place] ,Support group, Peer support services, [address] [place], [place] [address] 336- [phone]  http://www.kerr.com/           National Alliance on Mental Illness (NAMI) [place]- Wellness classes, Support groups        [address], [place], [place] [address] [phone]   CurrentJokes.cz   [facility]  (Psycho-social Rehabilitation clubhouse, Individual and group therapy) [address], [place] [address]   [phone]  24- Hour Availability:  *[place] or [phone] * Family Service of the [employer] (Domestic Violence, Rape, etc. )[phone] [place] Sessions [phone] or [phone] * [place] [phone] only) [phone] (after hours) *Therapeutic Alternative Mobile Crisis Unit [phone] *[place] National Suicide Hotline [phone] Diamantina Monks)

## 2020-08-13 NOTE — Progress Notes (Signed)
    SUBJECTIVE:   CHIEF COMPLAINT / HPI: f/u anxiety  Anxiety Started Lexapro and Atarax at last visit.  Tolerating medications.  Reports staring to have some relief but would like to increase dose of Lexapro.  No SI/HI.  Has not been able to stay in contact with me weekly as previously discussed.  Seen by CCM and has decided not to continue with outreach program that was offered.    PERTINENT  PMH / PSH:  Anxiety  OBJECTIVE:   BP 110/62   Pulse 75   Ht 5\' 9"  (1.753 m)   Wt 143 lb 3.2 oz (65 kg)   SpO2 96%   BMI 21.15 kg/m     General: Alert and oriented, no apparent distress  Cardiovascular: RRR with no murmurs noted Respiratory: CTA bilaterally   Psych: Behavior and speech appropriate to situation  ASSESSMENT/PLAN:   Anxiety disorder PHQ and GAD reviewed and slight decrease since last visit.  Tolerating medications.  No SI/HI.   -Increase Lexapro to 10 mg daily -Continue Atarax 5 mg as needed -Follow up in 4 weeks and patient not able to make it back to clinic in 1-2 weeks.  I will call her next week to see how she is doing. -Crisis line provided.     Carollee Leitz, MD [place]

## 2020-08-16 ENCOUNTER — Ambulatory Visit (INDEPENDENT_AMBULATORY_CARE_PROVIDER_SITE_OTHER): Payer: 59 | Admitting: Family Medicine

## 2020-08-16 ENCOUNTER — Other Ambulatory Visit: Payer: Self-pay

## 2020-08-16 ENCOUNTER — Encounter: Payer: Self-pay | Admitting: Family Medicine

## 2020-08-16 DIAGNOSIS — F419 Anxiety disorder, unspecified: Secondary | ICD-10-CM | POA: Diagnosis not present

## 2020-08-16 MED ORDER — ESCITALOPRAM OXALATE 10 MG PO TABS: 10.0000 mg | ORAL_TABLET | Freq: Every day | ORAL | 1 refills | Status: DC

## 2020-08-18 ENCOUNTER — Encounter: Payer: Self-pay | Admitting: Family Medicine

## 2020-08-18 DIAGNOSIS — F419 Anxiety disorder, unspecified: Secondary | ICD-10-CM | POA: Insufficient documentation

## 2020-08-18 NOTE — Assessment & Plan Note (Addendum)
PHQ and GAD reviewed and slight decrease since last visit.  Tolerating medications.  No SI/HI.   -Increase Lexapro to 10 mg daily -Continue Atarax 5 mg as needed -Follow up in 4 weeks and patient not able to make it back to clinic in 1-2 weeks.  I will call her next week to see how she is doing. -Crisis line provided.

## 2020-09-12 NOTE — Progress Notes (Deleted)
    SUBJECTIVE:   CHIEF COMPLAINT / HPI: f/u depression    PERTINENT  PMH / PSH: ***  OBJECTIVE:   There were no vitals taken for this visit.  ***  ASSESSMENT/PLAN:   No problem-specific Assessment & Plan notes found for this encounter.     Carollee Leitz, MD [place]

## 2020-09-24 ENCOUNTER — Ambulatory Visit: Payer: 59 | Admitting: Family Medicine

## 2020-10-02 ENCOUNTER — Other Ambulatory Visit: Payer: Self-pay | Admitting: Family Medicine

## 2021-09-03 ENCOUNTER — Other Ambulatory Visit: Payer: Self-pay | Admitting: Family Medicine

## 2021-09-26 ENCOUNTER — Telehealth: Payer: Self-pay

## 2021-09-26 NOTE — Telephone Encounter (Signed)
Patient calls nurse line requesting a Covid Test Kit sent to her pharmacy. Patient reports she has been buying a lot of them to test herself and her husband and the cost has become unaffordable. Patient reports she spoke to [facility] and they can dispense a free test as long as its sent in as a prescription.   Patient reports her and her husband both tested positive on 11/23. Patient reports she can not go back to work until her husband tests negative.   Will forward to PCP.

## 2021-09-29 ENCOUNTER — Other Ambulatory Visit: Payer: Self-pay | Admitting: Family Medicine

## 2021-09-29 MED ORDER — COVID-19 AT HOME ANTIGEN TEST VI KIT: PACK | 0 refills | Status: DC

## 2021-09-30 ENCOUNTER — Ambulatory Visit: Payer: 59 | Admitting: Family Medicine

## 2021-10-07 ENCOUNTER — Ambulatory Visit (INDEPENDENT_AMBULATORY_CARE_PROVIDER_SITE_OTHER): Payer: 59 | Admitting: Family Medicine

## 2021-10-07 ENCOUNTER — Encounter: Payer: Self-pay | Admitting: Family Medicine

## 2021-10-07 ENCOUNTER — Other Ambulatory Visit: Payer: Self-pay

## 2021-10-07 DIAGNOSIS — M5431 Sciatica, right side: Secondary | ICD-10-CM | POA: Insufficient documentation

## 2021-10-07 NOTE — Patient Instructions (Signed)
It was great seeing you today!  Today we discussed your leg pain, it seems this is likely due to sciatic pain which is one of the longest nerves in our body that runs down the leg. The best cure for this is regular stretches. Please do these as we discussed at least 1-2 times daily, more would be even better.   Please follow up at your next scheduled appointment in 1-2 months, if anything arises between now and then, please don't hesitate to contact our office.   Thank you for allowing [place] to be a part of your medical care!  Thank you, Dr. Larae Grooms

## 2021-10-07 NOTE — Progress Notes (Signed)
    SUBJECTIVE:   CHIEF COMPLAINT / HPI:   Patient presents for right leg pain since her first pregnancy about 42 years ago. Comes in today due to worsening pain. Describes pain as stabbing pain that sometimes feels like a 10/10 pain. Sometimes experiences numbness, tingling and throbbing. Pain radiates up to her hip/low back and down to her leg. No recent trauma or injury but when she was 60 years old, she stuck a needle above her right knee as a dare with her friends when playing a game and since then the pain started. She reports that the location where her pain originates is exactly where the needle was inserted. Family history of diabetes but she has a history of hypoglycemia in the past. Eats a balanced diet and exercises regularly. Quit smoking 09/17/2021. Still able to do all her daily activities but the pain bothers her. She has used muscle cream but this does not help. Standing for long periods of time makes it worse.   OBJECTIVE:   BP 121/77   Pulse 71   Ht 5\' 9"  (1.753 m)   Wt 149 lb (67.6 kg)   BMI 22.00 kg/m   General: Patient well-appearing, in no acute distress. CV: RRR, no murmurs or gallops auscultated Resp: CTAB, no wheezing or rales noted Ext: radial and distal pulses strong and equal bilaterally MSK: tightness representing chronic changes along mid to right L4-L5  Special testing: positive straight leg on the right, negative Spurling's testing  Neuro: normal gait, gross sensation intact   ASSESSMENT/PLAN:   Sciatica of right side -no red flags, seems more consistent with sciatic pain given history and exam, positive straight leg raise -handout on stretching exercises provided, encouraged to do these at least 1-2 times daily -NSAIDs as needed for pain relief -heating pad  -follow up in 1-2 months with PCP, consider A1c at next visit although doubt neuropathic pain   -PHQ-9 score of 3 with negative question 9 reviewed and discussed.   Donney Dice, [place]

## 2021-10-07 NOTE — Assessment & Plan Note (Signed)
-  no red flags, seems more consistent with sciatic pain given history and exam, positive straight leg raise -handout on stretching exercises provided, encouraged to do these at least 1-2 times daily -NSAIDs as needed for pain relief -heating pad  -follow up in 1-2 months with PCP, consider A1c at next visit although doubt neuropathic pain

## 2022-01-10 ENCOUNTER — Ambulatory Visit: Payer: Self-pay | Admitting: Family Medicine

## 2022-01-13 ENCOUNTER — Ambulatory Visit: Payer: Self-pay | Admitting: Family Medicine

## 2022-01-27 ENCOUNTER — Encounter: Payer: Self-pay | Admitting: Family Medicine

## 2022-01-27 ENCOUNTER — Ambulatory Visit (INDEPENDENT_AMBULATORY_CARE_PROVIDER_SITE_OTHER): Payer: 59 | Admitting: Family Medicine

## 2022-01-27 VITALS — BP 104/70 | HR 73 | Ht 69.0 in | Wt 155.8 lb

## 2022-01-27 DIAGNOSIS — F17200 Nicotine dependence, unspecified, uncomplicated: Secondary | ICD-10-CM

## 2022-01-27 DIAGNOSIS — Z1211 Encounter for screening for malignant neoplasm of colon: Secondary | ICD-10-CM

## 2022-01-27 DIAGNOSIS — M722 Plantar fascial fibromatosis: Secondary | ICD-10-CM | POA: Diagnosis not present

## 2022-01-27 DIAGNOSIS — Z0000 Encounter for general adult medical examination without abnormal findings: Secondary | ICD-10-CM

## 2022-01-27 DIAGNOSIS — E785 Hyperlipidemia, unspecified: Secondary | ICD-10-CM

## 2022-01-27 DIAGNOSIS — Z7189 Other specified counseling: Secondary | ICD-10-CM | POA: Diagnosis not present

## 2022-01-27 DIAGNOSIS — Z1231 Encounter for screening mammogram for malignant neoplasm of breast: Secondary | ICD-10-CM | POA: Diagnosis not present

## 2022-01-27 DIAGNOSIS — R7309 Other abnormal glucose: Secondary | ICD-10-CM

## 2022-01-27 DIAGNOSIS — E041 Nontoxic single thyroid nodule: Secondary | ICD-10-CM

## 2022-01-27 DIAGNOSIS — F419 Anxiety disorder, unspecified: Secondary | ICD-10-CM

## 2022-01-27 MED ORDER — HYDROXYZINE HCL 10 MG PO TABS: 5.0000 mg | ORAL_TABLET | Freq: Three times a day (TID) | ORAL | 0 refills | Status: DC | PRN

## 2022-01-27 MED ORDER — DICLOFENAC SODIUM 1 % EX GEL
4.0000 g | Freq: Four times a day (QID) | CUTANEOUS | 2 refills | Status: DC
Start: 2022-01-27 — End: 2023-02-03

## 2022-01-27 NOTE — Patient Instructions (Addendum)
Thank you for coming to see me today. It was a pleasure. Today we talked about:  ? ? ?You have plantar fasciitis ?Take tylenol as needed for pain  ?Diclofenac gel 4 times a day as needed ?Plantar fascia stretch for 20-30 seconds (do 3 of these) in morning ?Lowering/raise on a step exercises 3 x 10 once or twice a day - this is very important for long term recovery. ?Can add heel walks, toe walks forward and backward as well ?Ice heel for 15 minutes as needed. ?Avoid flat shoes/barefoot walking as much as possible. ?Arch straps have been shown to help with pain. ?Inserts are important (dr. Zoe Lan active series, spencos, our green insoles, custom orthotics). ?Steroid injection is a consideration for short term pain relief if you are struggling. ?Physical therapy is also an option. ?Follow up with me in 2 weeks.  ? ?Referral sent for Mammogram and Colonoscopy. ? ? ?If you have any questions or concerns, please do not hesitate to call the office at [phone]. ? ?Best,  ? ?Carollee Leitz, MD   ? ?Plantar Fasciitis ?Plantar fasciitis is a painful foot condition that affects the heel. It occurs when the band of tissue that connects the toes to the heel bone (plantar fascia) becomes irritated. This can happen as the result of exercising too much or doing other repetitive activities (overuse injury). ?Plantar fasciitis can cause mild irritation to severe pain that makes it difficult to walk or move. The pain is usually worse in the morning after sleeping, or after sitting or lying down for a period of time. Pain may also be worse after long periods of walking or standing. ?What are the causes? ?This condition may be caused by: ?Standing for long periods of time. ?Wearing shoes that do not have good arch support. ?Doing activities that put stress on joints (high-impact activities). This includes ballet and exercise that makes your heart beat faster (aerobic exercise), such as running. ?Being overweight. ?An abnormal way of  walking (gait). ?Tight muscles in the back of your lower leg (calf). ?High arches in your feet or flat feet. ?Starting a new athletic activity. ?What are the signs or symptoms? ?The main symptom of this condition is heel pain. Pain may get worse after the following: ?Taking the first steps after a time of rest, especially in the morning after awakening, or after you have been sitting or lying down for a while. ?Long periods of standing still. ?Pain may decrease after 30-45 minutes of activity, such as gentle walking. ?How is this diagnosed? ?This condition may be diagnosed based on your medical history, a physical exam, and your symptoms. Your health care provider will check for: ?A tender area on the bottom of your foot. ?A high arch in your foot or flat feet. ?Pain when you move your foot. ?Difficulty moving your foot. ?You may have imaging tests to confirm the diagnosis, such as: ?X-rays. ?Ultrasound. ?MRI. ?How is this treated? ?Treatment for plantar fasciitis depends on how severe your condition is. Treatment may include: ?Rest, ice, pressure (compression), and raising (elevating) the affected foot. This is called RICE therapy. Your health care provider may recommend RICE therapy along with over-the-counter pain medicines to manage your pain. ?Exercises to stretch your calves and your plantar fascia. ?A splint that holds your foot in a stretched, upward position while you sleep (night splint). ?Physical therapy to relieve symptoms and prevent problems in the future. ?Injections of steroid medicine (cortisone) to relieve pain and inflammation. ?Stimulating your plantar  fascia with electrical impulses (extracorporeal shock wave therapy). This is usually the last treatment option before surgery. ?Surgery, if other treatments have not worked after 12 months. ?Follow these instructions at home: ?Managing pain, stiffness, and swelling ? ?If directed, put ice on the painful area. To do this: ?Put ice in a plastic bag,  or use a frozen bottle of water. ?Place a towel between your skin and the bag or bottle. ?Roll the bottom of your foot over the bag or bottle. ?Do this for 20 minutes, 2-3 times a day. ?Wear athletic shoes that have air-sole or gel-sole cushions, or try soft shoe inserts that are designed for plantar fasciitis. ?Elevate your foot above the level of your heart while you are sitting or lying down. ?Activity ?Avoid activities that cause pain. Ask your health care provider what activities are safe for you. ?Do physical therapy exercises and stretches as told by your health care provider. ?Try activities and forms of exercise that are easier on your joints (low impact). Examples include swimming, water aerobics, and biking. ?General instructions ?Take over-the-counter and prescription medicines only as told by your health care provider. ?Wear a night splint while sleeping, if told by your health care provider. Loosen the splint if your toes tingle, become numb, or turn cold and blue. ?Maintain a healthy weight, or work with your health care provider to lose weight as needed. ?Keep all follow-up visits. This is important. ?Contact a health care provider if you have: ?Symptoms that do not go away with home treatment. ?Pain that gets worse. ?Pain that affects your ability to move or do daily activities. ?Summary ?Plantar fasciitis is a painful foot condition that affects the heel. It occurs when the band of tissue that connects the toes to the heel bone (plantar fascia) becomes irritated. ?Heel pain is the main symptom of this condition. It may get worse after exercising too much or standing still for a long time. ?Treatment varies, but it usually starts with rest, ice, pressure (compression), and raising (elevating) the affected foot. This is called RICE therapy. Over-the-counter medicines can also be used to manage pain. ?This information is not intended to replace advice given to you by your health care provider. Make  sure you discuss any questions you have with your health care provider. ?Document Revised: 01/30/2020 Document Reviewed: 01/30/2020 ?Elsevier Patient Education ? [place]. ? ? ?Therapy and Counseling Resources ?Most providers on this list will take Medicaid. Patients with commercial insurance or Medicare should contact their insurance company to get a list of in network providers. ? ?[facility] ? [address], [place], [place] [address]      [phone] ? ?[facility] ?[place], [place] [address] ?[phone] ? ?[facility] ?[address] [place], [place] [address]       [phone]    ? ? ?[facility] ?[address] [place], [place] ? ?[facility]:  [address] [place] ? ?[facility]:  ?[place] [phone] ? ?[facility] (under & uninsured) ?[address], [place] [id number]    [email or web address]   ? ?[facility] ?[place]     Phone:  [phone]     [place]  [phone]  ? ?[facility] ?#  [place] [place], [place] ext [phone] ? ?[facility]: [place], [place], [place]  ? ?[facility] ([place] therapist) ?[address]   [place] [address]    [phone]   ? ?[facility]   ?[address] [place],  [place], [place]  ? ?[facility]  ?[address] [place]  [phone] ? ?[facility]  ?[place], [place]        [phone] ? ?[facility] under & uninsured ?[place], To schedule an appointment call [phone] ?[address], [place] [phone]):  [place] Knock - Fri from 8 AM - 3 PM ?Moving June 1 to  [employer] at [facility] [address], [place] [address] ? ?[facility],  ?([place])   [place]: [phone]) 8:30 - 12; 1 - 2:30 ? ?[facility]

## 2022-01-27 NOTE — Progress Notes (Signed)
? ? ?SUBJECTIVE:  ? ?CHIEF COMPLAINT / HPI: check up and sore left foot ? ?Left foot pain ?Has been having increased pain in left heal for months.  Was recently powwow dancing on toes for two weeks ago that seemed to make pain worse.  Has not taken any medication for pain.  Reports has been using compression stocking which seems to help.  Pain mostly in heal but also radiates up back of leg.  Worse when standing.  Denies any fevers, calf swelling, trauma, numbness/tingling or weakness.  No recent long travel.  ? ?Requesting ultrasound of thyroid and TSH lab ?Reports had ultrasound in past and was told to have yearly follow up .  Has had previous biopsies with negative results.  Last seen at [facility]. Denies any hypo/hyperthyroid symptoms.  Reports family history of thyroid cancer in mother and aunt. ? ?Smoking Cessation ?Quit Nov 22/2022.  Doing well.  13 pack/years.   ? ?Grief ?Recently lost close loved one.  Doing well but requesting therapy.  Has history of mood disorder.  Tolerating Lexapro 10 mg daily prn.  Has run out of Atarax. ? ? 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 ? ?OBJECTIVE:  ? ?BP 104/70   Pulse 73   Ht '5\' 9"'$  (1.753 m)   Wt 155 lb 12.8 oz (70.7 kg)   SpO2 100%   BMI 23.01 kg/m?   ? ?General: Alert, no acute distress ?Neck: No goiter, thyroid nodules or pain on palpation. ?Cardio: Normal S1 and S2, RRR, no r/m/g ?Pulm: CTAB, normal work of breathing ?Abdomen: Bowel sounds normal. Abdomen soft and non-tender.  ?Extremities: No peripheral edema.  ?Left Foot: ?Inspection:  No obvious bony deformity.  No swelling, erythema, or bruising.  Normal arch ?Palpation: TTP at the plantar tendon insertion site ?ROM: Full  ROM of the ankle. Normal midfoot flexibility ?Strength: 5/5 strength ankle in all planes ?Neurovascular: N/V intact distally in the lower extremity ?Special tests: Negative anterior drawer. Negative squeeze.  normal midfoot flexibility. Normal calcaneal motion with heel raise  ? ? ?ASSESSMENT/PLAN:  ? ?Plantar fasciitis, left ?Take tylenol as needed for pain  ?Diclofenac gel 4 times a day as needed ?Plantar fascia stretch for 20-30 seconds (do 3 of these) in morning ?Lowering/raise on a step exercises 3 x 10 once or twice a day - this is very important for long term recovery. ?Can add heel walks, toe walks forward and backward as well ?Ice heel for 15 minutes as needed. ?Avoid flat shoes/barefoot walking as much as possible. ?Arch straps have been shown to help with pain. ?Inserts are important (dr. Zoe Lan active series, spencos, our green insoles, custom orthotics). ?Physical therapy is also an option. ?Follow up with me in 2-3 weeks. ? ?Thyroid nodule ?History of Nontoxic Multinodular goiter. Last TSH 2021 wnl.  Currently asymptomatic. ?Will obtain TSH ?U/S thyroid ?Follow up with results ? ?Tobacco use disorder ?Congratulated on decision to quit smoking. Does not meet criteria to obtain low dose CT chest. ?Continue to monitor ? ?Preventative health care ?Referral for Colonoscopy  ?Mammogram ordered today ?Recommend Shingles vaccination ?Obtain records from recent PAP ?A1c today ?Follow up 1 year for ongoing HCM ? ? ?Grief counseling ?Recent loss of loved one.   ?CCM referral for grief counseling and help to set up with outside resources ?Therapy resources provided ? ?Anxiety disorder ?Continue Lexapro 10 mg daily ?Refill Atarax 5 mg TID prn ?Therapy resources provided ? ?Dyslipidemia ?  Lipid panel today ?  ? ? ?Carollee Leitz, MD ?[facility]  ?

## 2022-01-28 ENCOUNTER — Telehealth: Payer: Self-pay | Admitting: *Deleted

## 2022-01-28 NOTE — Chronic Care Management (AMB) (Signed)
?  Care Management  ? ?Outreach Note ? ?01/28/2022 ?Name: Gail Gonzalez MRN: [id number] DOB: 05/12/1961 ? ?Referred by: Carollee Leitz, MD ?Reason for referral : Care Coordination (Initial outreach to schedule referral  with BSW ) ? ? ?An unsuccessful telephone outreach was attempted today. The patient was referred to the case management team for assistance with care management and care coordination.  ? ?Follow Up Plan:  ?A HIPAA compliant phone message was left for the patient providing contact information and requesting a return call.  ?The care management team will reach out to the patient again over the next 7 days.  ?If patient returns call to provider office, please advise to call [place] * at [phone].* ? ?Laverda Sorenson  ?Care Guide, Embedded Care Coordination ?[place]  Care Management  ?Direct Dial: [phone] ? ?

## 2022-01-29 NOTE — Chronic Care Management (AMB) (Signed)
?  Care Management  ? ?Outreach Note ? ?01/29/2022 ?Name: Brooks Stotz MRN: [id number] DOB: 03/18/1961 ? ?Referred by: Carollee Leitz, MD ?Reason for referral : Care Coordination (Initial outreach to schedule referral  with BSW ) ? ? ?A second unsuccessful telephone outreach was attempted today. The patient was referred to the case management team for assistance with care management and care coordination.  ? ?Follow Up Plan:  ?A HIPAA compliant phone message was left for the patient providing contact information and requesting a return call.  ?The care management team will reach out to the patient again over the next 14 days.  ?If patient returns call to provider office, please advise to call [place]* at [phone].* ? ?Laverda Sorenson  ?Care Guide, Embedded Care Coordination ?[place]  Care Management  ?Direct Dial: [phone] ? ?

## 2022-01-30 NOTE — Chronic Care Management (AMB) (Signed)
?  Care Management  ? ?Outreach Note ? ?01/30/2022 ?Name: Gail Gonzalez MRN: [id number] DOB: 1960-11-01 ? ?Referred by: Carollee Leitz, MD ?Reason for referral : Care Coordination (Initial outreach to schedule referral  with BSW ) ? ? ?Third unsuccessful telephone outreach was attempted today. The patient was referred to the case management team for assistance with care management and care coordination. The patient's primary care provider has been notified of our unsuccessful attempts to make or maintain contact with the patient. The care management team is pleased to engage with this patient at any time in the future should he/she be interested in assistance from the care management team.  ? ?Follow Up Plan:  ?We have been unable to make contact with the patient. The care management team is available to follow up with the patient after provider conversation with the patient regarding recommendation for care management engagement and subsequent re-referral to the care management team. A HIPAA compliant phone message was left for the patient providing contact information and requesting a return call.  ? ?Laverda Sorenson  ?Care Guide, Embedded Care Coordination ?[place]  Care Management  ?Direct Dial: [phone] ? ?

## 2022-02-02 ENCOUNTER — Encounter: Payer: Self-pay | Admitting: Family Medicine

## 2022-02-02 DIAGNOSIS — Z7189 Other specified counseling: Secondary | ICD-10-CM | POA: Insufficient documentation

## 2022-02-02 DIAGNOSIS — M722 Plantar fascial fibromatosis: Secondary | ICD-10-CM | POA: Insufficient documentation

## 2022-02-02 NOTE — Assessment & Plan Note (Addendum)
Take tylenol as needed for pain  ?Diclofenac gel 4 times a day as needed ?Plantar fascia stretch for 20-30 seconds (do 3 of these) in morning ?Lowering/raise on a step exercises 3 x 10 once or twice a day - this is very important for long term recovery. ?Can add heel walks, toe walks forward and backward as well ?Ice heel for 15 minutes as needed. ?Avoid flat shoes/barefoot walking as much as possible. ?Arch straps have been shown to help with pain. ?Inserts are important (dr. Zoe Lan active series, spencos, our green insoles, custom orthotics). ?Physical therapy is also an option. ?Follow up with me in 2-3 weeks. ?

## 2022-02-02 NOTE — Assessment & Plan Note (Signed)
Congratulated on decision to quit smoking. Does not meet criteria to obtain low dose CT chest. ?Continue to monitor ?

## 2022-02-02 NOTE — Assessment & Plan Note (Addendum)
Recent loss of loved one.   ?CCM referral for grief counseling and help to set up with outside resources ?Therapy resources provided ?

## 2022-02-02 NOTE — Assessment & Plan Note (Signed)
History of Nontoxic Multinodular goiter. Last TSH 2021 wnl.  Currently asymptomatic. ?Will obtain TSH ?U/S thyroid ?Follow up with results ?

## 2022-02-02 NOTE — Assessment & Plan Note (Signed)
Continue Lexapro 10 mg daily ?Refill Atarax 5 mg TID prn ?Therapy resources provided ?

## 2022-02-02 NOTE — Assessment & Plan Note (Addendum)
Referral for Colonoscopy  ?Mammogram ordered today ?Recommend Shingles vaccination ?Obtain records from recent PAP ?A1c today ?Follow up 1 year for ongoing HCM ? ?

## 2022-02-02 NOTE — Assessment & Plan Note (Signed)
Lipid panel today

## 2022-02-03 ENCOUNTER — Telehealth: Payer: Self-pay | Admitting: *Deleted

## 2022-02-03 NOTE — Telephone Encounter (Signed)
-----   Message from Carollee Leitz, MD sent at 02/02/2022  3:32 AM EDT ----- ?Please call to schedule a thyroid ultrasound for patient.  Order is already placed to [facility].   ? ?Thank you ? ?Carollee Leitz, MD ?Family Medicine Residency  ?  ? ?

## 2022-02-03 NOTE — Telephone Encounter (Signed)
LVM for pt to call office to give her the appointment time for her [place]. ? ?Location: [facility] 1st Floor Radiology ?Date: Monday 02/10/2022 ?Time: Arrive at 2:15pm for a 2:30pm appointment.  ? ?Please give her this information if she calls back.  If this time does not work she can call [phone] to reschedule, will also send her a [employer]. Marland Kitchen Levon Penning Zimmerman Rumple, CMA ? ?

## 2022-02-04 NOTE — Telephone Encounter (Signed)
Patient calls nurse line returning a call.  ? ?Patient advised of [place] apt, however can not make this date and time. Patient asked I reschedule her vs her calling.  ? ?Patient rescheduled for 4/24 '@1230pm'$ . Arrival time 1215pm. ? ? VM left informing patient of date and time.  ?

## 2022-02-10 ENCOUNTER — Other Ambulatory Visit (HOSPITAL_COMMUNITY): Payer: 59

## 2022-02-12 ENCOUNTER — Telehealth: Payer: Self-pay | Admitting: *Deleted

## 2022-02-12 NOTE — Telephone Encounter (Signed)
LM for patient to call me back about her mammogram.  Patient left a message stating that she now has [employer] health plan and they aren't in network with [facility].  Patient will need to get this done at [facility] in [place].  Before I send her information to them, I want to make sure she is aware.  Will wait to hear back from patient.  Haisley Arens,CMA ? ?

## 2022-02-12 NOTE — Progress Notes (Signed)
Screening mammogram order changed to [facility] as they take [employer] health plan for screenings only.  Gail Gonzalez,CMA ? ?

## 2022-02-12 NOTE — Addendum Note (Signed)
Addended by: Valerie Roys on: 02/12/2022 02:39 PM ? ? Modules accepted: Orders ? ?

## 2022-02-12 NOTE — Telephone Encounter (Signed)
Patient called me back and found out that [place] at [facility] does take [facility] but only for screening mammograms.  Order changed to reflect new location.  Cookie Pore,CMA ? ?

## 2022-02-17 ENCOUNTER — Ambulatory Visit (HOSPITAL_COMMUNITY): Payer: 59

## 2022-02-24 ENCOUNTER — Ambulatory Visit: Payer: 59 | Admitting: Family Medicine

## 2022-02-24 NOTE — Patient Instructions (Incomplete)
Thank you for coming to see me today. It was a pleasure.  ? ?Recommend Shingles vaccine.  This is a 2 dose series and can be given at your local pharmacy.  Please talk to your pharmacist about this.  ? ?I have placed an order for your mammogram.  Please call [facility] at [phone] to schedule your appointment within one week.  ? ?Please follow-up with PCP as needed ? ?If you have any questions or concerns, please do not hesitate to call the office at [phone]. ? ?Best,  ? ?Carollee Leitz, MD   ?

## 2022-02-24 NOTE — Progress Notes (Deleted)
    SUBJECTIVE:   CHIEF COMPLAINT / HPI:   ***  PERTINENT  PMH / PSH: ***  OBJECTIVE:   There were no vitals taken for this visit.   General: Alert, no acute distress Cardio: Normal S1 and S2, RRR, no r/m/g Pulm: CTAB, normal work of breathing Abdomen: Bowel sounds normal. Abdomen soft and non-tender.  Extremities: No peripheral edema.  Neuro: Cranial nerves grossly intact   ASSESSMENT/PLAN:   No problem-specific Assessment & Plan notes found for this encounter.     Kenon Delashmit, MD [facility]   

## 2022-03-03 ENCOUNTER — Other Ambulatory Visit (HOSPITAL_COMMUNITY): Payer: 59

## 2022-03-10 ENCOUNTER — Other Ambulatory Visit (HOSPITAL_COMMUNITY): Payer: 59

## 2022-03-10 ENCOUNTER — Ambulatory Visit: Payer: 59 | Admitting: Family Medicine

## 2022-03-17 ENCOUNTER — Encounter: Payer: Self-pay | Admitting: Family Medicine

## 2022-03-17 ENCOUNTER — Ambulatory Visit (INDEPENDENT_AMBULATORY_CARE_PROVIDER_SITE_OTHER): Payer: 59 | Admitting: Family Medicine

## 2022-03-17 ENCOUNTER — Ambulatory Visit (HOSPITAL_COMMUNITY)
Admission: RE | Admit: 2022-03-17 | Discharge: 2022-03-17 | Disposition: A | Discharge status: Home / Self Care | Payer: 59 | Source: Ambulatory Visit | Attending: Family Medicine | Admitting: Family Medicine

## 2022-03-17 VITALS — BP 115/61 | HR 75 | Ht 69.0 in | Wt 156.0 lb

## 2022-03-17 DIAGNOSIS — M545 Low back pain, unspecified: Secondary | ICD-10-CM

## 2022-03-17 DIAGNOSIS — F17200 Nicotine dependence, unspecified, uncomplicated: Secondary | ICD-10-CM

## 2022-03-17 DIAGNOSIS — E041 Nontoxic single thyroid nodule: Secondary | ICD-10-CM

## 2022-03-17 MED ORDER — BACLOFEN 5 MG PO PACK
5.0000 mg | PACK | Freq: Two times a day (BID) | ORAL | 0 refills | Status: DC | PRN
Start: 2022-03-17 — End: 2022-03-21

## 2022-03-17 NOTE — Patient Instructions (Addendum)
Thank you for coming to see me today. It was a pleasure.   Take Tylenol 1300 mg 3 times a day Take ibuprofen 600 mg 3 times a day Use diclofenac gel 4 times a day on your back as needed.  Can also use this on your right leg. Use baclofen 5 mg twice daily as needed   Call [facility] at [phone] to schedule your colonoscopy.  Please follow-up with PCP in 2 weeks  If you have any questions or concerns, please do not hesitate to call the office at [phone].  Best,   Carollee Leitz, MD    Back Exercises These exercises help to make your trunk and back strong. They also help to keep the lower back flexible. Doing these exercises can help to prevent or lessen pain in your lower back. If you have back pain, try to do these exercises 2-3 times each day or as told by your doctor. As you get better, do the exercises once each day. Repeat the exercises more often as told by your doctor. To stop back pain from coming back, do the exercises once each day, or as told by your doctor. Do exercises exactly as told by your doctor. Stop right away if you feel sudden pain or your pain gets worse. Exercises Single knee to chest Do these steps 3-5 times in a row for each leg: Lie on your back on a firm bed or the floor with your legs stretched out. Bring one knee to your chest. Grab your knee or thigh with both hands and hold it in place. Pull on your knee until you feel a gentle stretch in your lower back or butt. Keep doing the stretch for 10-30 seconds. Slowly let go of your leg and straighten it. Pelvic tilt Do these steps 5-10 times in a row: Lie on your back on a firm bed or the floor with your legs stretched out. Bend your knees so they point up to the ceiling. Your feet should be flat on the floor. Tighten your lower belly (abdomen) muscles to press your lower back against the floor. This will make your tailbone point up to the ceiling instead of pointing down to your feet or the  floor. Stay in this position for 5-10 seconds while you gently tighten your muscles and breathe evenly. Cat-cow Do these steps until your lower back bends more easily: Get on your hands and knees on a firm bed or the floor. Keep your hands under your shoulders, and keep your knees under your hips. You may put padding under your knees. Let your head hang down toward your chest. Tighten (contract) the muscles in your belly. Point your tailbone toward the floor so your lower back becomes rounded like the back of a cat. Stay in this position for 5 seconds. Slowly lift your head. Let the muscles of your belly relax. Point your tailbone up toward the ceiling so your back forms a sagging arch like the back of a cow. Stay in this position for 5 seconds.  Press-ups Do these steps 5-10 times in a row: Lie on your belly (face-down) on a firm bed or the floor. Place your hands near your head, about shoulder-width apart. While you keep your back relaxed and keep your hips on the floor, slowly straighten your arms to raise the top half of your body and lift your shoulders. Do not use your back muscles. You may change where you place your hands to make yourself more  comfortable. Stay in this position for 5 seconds. Keep your back relaxed. Slowly return to lying flat on the floor.  Bridges Do these steps 10 times in a row: Lie on your back on a firm bed or the floor. Bend your knees so they point up to the ceiling. Your feet should be flat on the floor. Your arms should be flat at your sides, next to your body. Tighten your butt muscles and lift your butt off the floor until your waist is almost as high as your knees. If you do not feel the muscles working in your butt and the back of your thighs, slide your feet 1-2 inches (2.5-5 cm) farther away from your butt. Stay in this position for 3-5 seconds. Slowly lower your butt to the floor, and let your butt muscles relax. If this exercise is too easy, try  doing it with your arms crossed over your chest. Belly crunches Do these steps 5-10 times in a row: Lie on your back on a firm bed or the floor with your legs stretched out. Bend your knees so they point up to the ceiling. Your feet should be flat on the floor. Cross your arms over your chest. Tip your chin a little bit toward your chest, but do not bend your neck. Tighten your belly muscles and slowly raise your chest just enough to lift your shoulder blades a tiny bit off the floor. Avoid raising your body higher than that because it can put too much stress on your lower back. Slowly lower your chest and your head to the floor. Back lifts Do these steps 5-10 times in a row: Lie on your belly (face-down) with your arms at your sides, and rest your forehead on the floor. Tighten the muscles in your legs and your butt. Slowly lift your chest off the floor while you keep your hips on the floor. Keep the back of your head in line with the curve in your back. Look at the floor while you do this. Stay in this position for 3-5 seconds. Slowly lower your chest and your face to the floor. Contact a doctor if: Your back pain gets a lot worse when you do an exercise. Your back pain does not get better within 2 hours after you exercise. If you have any of these problems, stop doing the exercises. Do not do them again unless your doctor says it is okay. Get help right away if: You have sudden, very bad back pain. If this happens, stop doing the exercises. Do not do them again unless your doctor says it is okay. This information is not intended to replace advice given to you by your health care provider. Make sure you discuss any questions you have with your health care provider. Document Revised: 12/26/2020 Document Reviewed: 12/26/2020 Elsevier Patient Education  [employer] [place].

## 2022-03-17 NOTE — Progress Notes (Unsigned)
    SUBJECTIVE:   CHIEF COMPLAINT / HPI: low back pain  Picked up heavy laundry basket 2 nights ago and felt pain in back.  Seemed to improve but yesterday when bending over to reach for something in shower had worsening pain in lower back.  Pain across lower back and feels like muscle spasm.  Tried Tylenol XS and now has a patch on without much relief.  Denies any fevers, incontinence of bowel or bladder, numbness,tingling, weakness of extremities.  Was Native dancing last week, which entails alot of standing on toes.  Reports she has had similar pain like this before when pulled muscle and is concerned that she will be in pain at work.  Would like something to help get through the next couple of days.   PERTINENT  PMH / PSH:  Right Sciatica Chronic back pain Plantar fasciitis Anxiety Thyroid nodule  OBJECTIVE:   BP 115/61   Pulse 75   Ht '5\' 9"'$  (1.753 m)   Wt 156 lb (70.8 kg)   SpO2 100%   BMI 23.04 kg/m    General: Alert, no acute distress Cardio: Normal S1 and S2, RRR, no r/m/g Pulm: CTAB, normal work of breathing Back - Normal skin, Spine with normal alignment and no deformity.  No tenderness to vertebral process palpation.  Paraspinous muscles are not tender and with spasm.   Range of motion is full at neck and lumbar sacral regions  ASSESSMENT/PLAN:   Back pain Likely muscular strain. No red flags Tylenol 1300 mg q8h x 14 days Ibuprofen 600 mg q8h x 14 days Diclofenac gel QID  Baclofen 5 mg BID x 5 days Low back exercises provided Follow up in 2 weeks  Strict return precautions provided  Tobacco use disorder Congratulated on continued smoking cessation   Thyroid nodule For u/s thyroid today Follow up with results     Carollee Leitz, MD [place]

## 2022-03-18 ENCOUNTER — Other Ambulatory Visit (HOSPITAL_COMMUNITY): Payer: Self-pay

## 2022-03-19 ENCOUNTER — Telehealth: Payer: Self-pay

## 2022-03-19 ENCOUNTER — Encounter: Payer: Self-pay | Admitting: Family Medicine

## 2022-03-19 NOTE — Assessment & Plan Note (Signed)
For u/s thyroid today Follow up with results

## 2022-03-19 NOTE — Assessment & Plan Note (Signed)
Likely muscular strain. No red flags Tylenol 1300 mg q8h x 14 days Ibuprofen 600 mg q8h x 14 days Diclofenac gel QID  Baclofen 5 mg BID x 5 days Low back exercises provided Follow up in 2 weeks  Strict return precautions provided

## 2022-03-19 NOTE — Telephone Encounter (Signed)
A Prior Authorization was initiated for this patients LYVISPAH '5MG'$  PACKETS (BACLOFEN PACKETS) through CoverMyMeds.   Key: [id number]

## 2022-03-19 NOTE — Assessment & Plan Note (Signed)
Congratulated on continued smoking cessation

## 2022-03-21 ENCOUNTER — Other Ambulatory Visit (HOSPITAL_COMMUNITY): Payer: Self-pay

## 2022-03-21 ENCOUNTER — Other Ambulatory Visit: Payer: Self-pay | Admitting: Family Medicine

## 2022-03-21 MED ORDER — BACLOFEN 10 MG PO TABS: 5.0000 mg | ORAL_TABLET | Freq: Two times a day (BID) | ORAL | 0 refills | Status: DC

## 2022-03-21 NOTE — Telephone Encounter (Signed)
Prescription sent for baclofen tablets  Thank you

## 2022-03-21 NOTE — Progress Notes (Signed)
Baclofen 5 mg po tablet x 15 tabs sent to pharmacy for muscle spasm  Gail Leitz, MD [facility]

## 2022-03-21 NOTE — Telephone Encounter (Signed)
Prior Auth for patients medication LYVISPAH '5MG'$  PACKETS denied by [facility]) via CoverMyMeds.   Reason: medication not on formulary.   Alternatives: baclofen tablet, carisoprodol tablet, chlorzoxazone tablet, cyclobenzaprine tablet, metaxalone tablet, metocarbamol tablet, tizanidine tablet  CoverMyMeds Key: [id number]

## 2022-03-27 ENCOUNTER — Other Ambulatory Visit: Payer: Self-pay

## 2022-03-27 DIAGNOSIS — K219 Gastro-esophageal reflux disease without esophagitis: Secondary | ICD-10-CM

## 2022-03-27 MED ORDER — OMEPRAZOLE 20 MG PO CPDR: DELAYED_RELEASE_CAPSULE | ORAL | 0 refills | Status: DC

## 2022-03-27 NOTE — Patient Instructions (Incomplete)
Thank you for coming to see me today. It was a pleasure.   Will send referral for biopsy of thyroid.  If you do not hear anything in the next 2 weeks please call the office to let us know.  Please follow-up with PCP as needed  If you have any questions or concerns, please do not hesitate to call the office at [phone].  Best,   Carollee Leitz, MD

## 2022-03-27 NOTE — Progress Notes (Deleted)
    SUBJECTIVE:   CHIEF COMPLAINT / HPI:   ***  PERTINENT  PMH / PSH: ***  OBJECTIVE:   There were no vitals taken for this visit.   General: Alert, no acute distress Cardio: Normal S1 and S2, RRR, no r/m/g Pulm: CTAB, normal work of breathing Abdomen: Bowel sounds normal. Abdomen soft and non-tender.  Extremities: No peripheral edema.  Neuro: Cranial nerves grossly intact   ASSESSMENT/PLAN:   No problem-specific Assessment & Plan notes found for this encounter.     Ovidio Steele, MD [facility]   

## 2022-03-28 ENCOUNTER — Ambulatory Visit: Payer: 59 | Admitting: Family Medicine

## 2022-03-28 ENCOUNTER — Encounter: Payer: Self-pay | Admitting: Family Medicine

## 2022-03-28 ENCOUNTER — Ambulatory Visit (HOSPITAL_BASED_OUTPATIENT_CLINIC_OR_DEPARTMENT_OTHER)
Admission: RE | Admit: 2022-03-28 | Discharge: 2022-03-28 | Disposition: A | Discharge status: Home / Self Care | Payer: 59 | Source: Ambulatory Visit | Attending: Family Medicine | Admitting: Family Medicine

## 2022-03-28 DIAGNOSIS — Z1231 Encounter for screening mammogram for malignant neoplasm of breast: Secondary | ICD-10-CM | POA: Diagnosis not present

## 2022-04-01 ENCOUNTER — Encounter: Payer: Self-pay | Admitting: *Deleted

## 2022-04-21 ENCOUNTER — Telehealth: Payer: Self-pay

## 2022-04-21 ENCOUNTER — Encounter: Payer: Self-pay | Admitting: Physician Assistant

## 2022-05-02 ENCOUNTER — Other Ambulatory Visit: Payer: Self-pay

## 2022-05-03 MED ORDER — ESCITALOPRAM OXALATE 10 MG PO TABS: 10.0000 mg | ORAL_TABLET | Freq: Every day | ORAL | 2 refills | Status: DC

## 2022-05-13 ENCOUNTER — Other Ambulatory Visit: Payer: Self-pay | Admitting: Family Medicine

## 2022-05-16 NOTE — Progress Notes (Unsigned)
05/19/2022 Gail Gonzalez [id number] May 16, 1961  Referring provider: McDiarmid, Blane Ohara, MD Primary GI doctor: Dr. Hilarie Fredrickson  ASSESSMENT AND PLAN:    Gastroesophageal reflux disease, unspecified whether esophagitis present With history of H. pylori treated with Pylera never had eradication test Some upper abdominal discomfort, worse with fatty foods, mother also with history of cholecystectomy. We will get right upper quadrant ultrasound to evaluate gallbladder. Discussed repeating endoscopy with patient but she would prefer to wait, will take patient off omeprazole for 2 weeks repeat H. pylori stool. Patient does have intermittent dysphagia, can be with liquids to occasionally, this points towards more motility rather than actual stricture, most likely this is from reflux, can consider barium swallow pending these results. In the meantime we will check CBC and CMET, if there is any anemia patient will be willing to proceed with EGD/colonoscopy. Close follow-up 4 to 6 weeks -     CBC with Differential/Platelet; Future -     Comprehensive metabolic panel; Future -     IgA; Future -     Tissue transglutaminase, IgA; Future -     US ABDOMEN LIMITED RUQ (LIVER/GB); Future -     Helicobacter pylori special antigen; Future -     famotidine (PEPCID) 20 MG tablet; Take 1 tablet (20 mg total) by mouth 2 (two) times daily.  Diverticulosis of colon without hemorrhage Will call if any symptoms. Add on fiber supplement, avoid NSAIDS, information given  Alternating constipation and diarrhea Seems to be more alternating constipation/diarrhea We will check x-ray to evaluate for stool burden Instruct patient get on fiber, IBgard, FODMAP given, check labs. Patient has anemia we will schedule for EGD and colonoscopy. Patient is due for colonoscopy 02/2023, if all labs are negative and continues, can schedule sooner -     CBC with Differential/Platelet; Future -     Comprehensive metabolic panel;  Future -     IgA; Future -     Tissue transglutaminase, IgA; Future -     High sensitivity CRP; Future -     Sedimentation rate; Future -     GI Profile, Stool, PCR; Future -     DG Abd 1 View; Future -     hyoscyamine (LEVSIN) 0.125 MG tablet; Take 1 tablet (0.125 mg total) by mouth every 6 (six) hours as needed for cramping.   Patient Care Team: Holley Bouche, MD as PCP - General (Family Medicine)  HISTORY OF PRESENT ILLNESS: 61 y.o. female with a past medical history of anxiety, endometriosis 2013, GERD, diverticulosis and others listed below presents for evaluation of bloating, nausea, diarrhea, GERD.   Last seen by Dr. Hilarie Fredrickson in the office 2014 04/21/2013 EGD with heartburn, dyspepsia and dysphagia nonobstructing Schatzki ring gastroesophageal junction, gastropathy entire stomach, single nonbleeding ulcer 3 x 5 mm duodenal bulb. Tested positive for H. pylori given Pylera.   Patient never had eradication study. 03/24/2013 colonoscopy screening purposes good prep with movi prep 3 mm polyp sessile, mild diverticulosis sigmoid colon, moderate internal and external hemorrhoids recall 10 years.  She continues to have bad reflux. She is on omeprazole daily 20 mg.  She states if she is off of it for 2 days it gets worse.  She does have dysphagia, worse with sweet potatoes or cheese but can also happen with soda. Will feel a bubble" in her esophagus and have trouble with swallowing but this is intermittent.  She states reflux worse with fatty foods, mom with history of GB removal.  She has had weight gain since she quit smoking in Nov (promised her mom after her passing in Sept)  Since Jan she has had AB bloating and discomfort.  She has diarrhea since Jan 3-4 x a day after eating.  Can also have formed stools with hard stools.  No nocturnal symptoms.  She has had BRB in the stool, large volume/dripping, occ rectal discomfort. Some AB cramping with BM, improved afterwards.  She has  fecal urgency, denies fecal incontinence.  She  denies  fever, chills, unintentional weight loss.   Denies close contacts ill, recent camping, recent travel. Patient denies recent antibiotic use. Patient  denies  new medications.  Last CBC checked 2020, last CMET checked 2020  Current Medications:     Current Outpatient Medications (Respiratory):    sodium chloride (OCEAN) 0.65 % SOLN nasal spray, Place 1 spray into both nostrils as needed for congestion.    Current Outpatient Medications (Other):    baclofen (LIORESAL) 10 MG tablet, Take 0.5 tablets (5 mg total) by mouth 2 (two) times daily.   COVID-19 At Home Antigen Test KIT, Use as directed.   diclofenac Sodium (VOLTAREN) 1 % GEL, Apply 4 g topically 4 (four) times daily.   escitalopram (LEXAPRO) 10 MG tablet, Take 1 tablet (10 mg total) by mouth daily.   famotidine (PEPCID) 20 MG tablet, Take 1 tablet (20 mg total) by mouth 2 (two) times daily.   hydrOXYzine (ATARAX) 10 MG tablet, Take 0.5 tablets (5 mg total) by mouth 3 (three) times daily as needed.   hyoscyamine (LEVSIN) 0.125 MG tablet, Take 1 tablet (0.125 mg total) by mouth every 6 (six) hours as needed for cramping.   Multiple Vitamin (MULTIVITAMIN) capsule, Take 1 capsule by mouth daily.   omeprazole (PRILOSEC) 20 MG capsule, TAKE 1 CAPSULE(20 MG) BY MOUTH DAILY  Medical History:  Past Medical History:  Diagnosis Date   Anemia    Anxiety    Arthritis    Cardiac arrhythmia due to congenital heart disease    Chronic back pain    Colon polyps 04/24/2013   Diverticulosis 02/24/2013   Endometriosis    on lap surgery. seen by Dr. Roberts   GERD (gastroesophageal reflux disease)    Heart murmur    Hemorrhoids    Rectal bleeding    Thyroid disease 10/27/2010   nodule   Allergies: No Known Allergies   Surgical History:  She  has a past surgical history that includes Biopsy thyroid (2013); Diagnostic laparoscopy (1990); and Breast biopsy (Left). Family History:   Her family history includes Alcohol abuse in her father and mother; Cancer in her mother; Colon cancer in her maternal uncle; Colon polyps in her mother; Diabetes in her mother; Hypertension in her mother; Prostate cancer in her brother; Uterine cancer in her maternal aunt. Social History:   reports that she has been smoking cigarettes. She has a 3.75 pack-year smoking history. She has never used smokeless tobacco. She reports current alcohol use of about 4.0 standard drinks of alcohol per week. She reports that she does not use drugs.  REVIEW OF SYSTEMS  : All other systems reviewed and negative except where noted in the History of Present Illness.   PHYSICAL EXAM: BP 102/72   Pulse 75   Ht 5' 9" (1.753 m)   Wt 156 lb (70.8 kg)   BMI 23.04 kg/m  General:   Pleasant, well developed female in no acute distress Head:   Normocephalic and atraumatic. Eyes:  sclerae anicteric,conjunctive   pink  Heart:   regular rate and rhythm Pulm:  Clear anteriorly; no wheezing Abdomen:   Soft, Obese AB, Active bowel sounds. mild tenderness in the epigastrium. Without guarding and Without rebound, No organomegaly appreciated. Rectal: declines Extremities:  Without edema. Msk: Symmetrical without gross deformities. Peripheral pulses intact.  Neurologic:  Alert and  oriented x4;  No focal deficits.  Skin:   Dry and intact without significant lesions or rashes. Psychiatric:  Cooperative. Normal mood and affect.    Vladimir Crofts, PA-C 11:01 AM

## 2022-05-19 ENCOUNTER — Other Ambulatory Visit (INDEPENDENT_AMBULATORY_CARE_PROVIDER_SITE_OTHER): Payer: 59

## 2022-05-19 ENCOUNTER — Ambulatory Visit: Payer: 59 | Admitting: Physician Assistant

## 2022-05-19 ENCOUNTER — Telehealth: Payer: Self-pay | Admitting: Physician Assistant

## 2022-05-19 ENCOUNTER — Ambulatory Visit (INDEPENDENT_AMBULATORY_CARE_PROVIDER_SITE_OTHER)
Admission: RE | Admit: 2022-05-19 | Discharge: 2022-05-19 | Disposition: A | Discharge status: Home / Self Care | Payer: 59 | Source: Ambulatory Visit | Attending: Physician Assistant | Admitting: Physician Assistant

## 2022-05-19 ENCOUNTER — Encounter: Payer: Self-pay | Admitting: Physician Assistant

## 2022-05-19 VITALS — BP 102/72 | HR 75 | Ht 69.0 in | Wt 156.0 lb

## 2022-05-19 DIAGNOSIS — K219 Gastro-esophageal reflux disease without esophagitis: Secondary | ICD-10-CM

## 2022-05-19 DIAGNOSIS — R1310 Dysphagia, unspecified: Secondary | ICD-10-CM

## 2022-05-19 DIAGNOSIS — R109 Unspecified abdominal pain: Secondary | ICD-10-CM

## 2022-05-19 DIAGNOSIS — K5730 Diverticulosis of large intestine without perforation or abscess without bleeding: Secondary | ICD-10-CM | POA: Diagnosis not present

## 2022-05-19 DIAGNOSIS — R198 Other specified symptoms and signs involving the digestive system and abdomen: Secondary | ICD-10-CM

## 2022-05-19 DIAGNOSIS — Z8619 Personal history of other infectious and parasitic diseases: Secondary | ICD-10-CM

## 2022-05-19 LAB — COMPREHENSIVE METABOLIC PANEL
ALT: 13 U/L (ref 0–35)
AST: 15 U/L (ref 0–37)
Albumin: 4.4 g/dL (ref 3.5–5.2)
Alkaline Phosphatase: 52 U/L (ref 39–117)
BUN: 12 mg/dL (ref 6–23)
CO2: 27 mEq/L (ref 19–32)
Calcium: 9.4 mg/dL (ref 8.4–10.5)
Chloride: 102 mEq/L (ref 96–112)
Creatinine, Ser: 0.75 mg/dL (ref 0.40–1.20)
GFR: 86.21 mL/min (ref >60.00)
Glucose, Bld: 98 mg/dL (ref 70–99)
Potassium: 4 mEq/L (ref 3.5–5.1)
Sodium: 135 mEq/L (ref 135–145)
Total Bilirubin: 0.4 mg/dL (ref 0.2–1.2)
Total Protein: 7.1 g/dL (ref 6.0–8.3)

## 2022-05-19 LAB — CBC WITH DIFFERENTIAL/PLATELET
Basophils Absolute: 0.1 10*3/uL (ref 0.0–0.1)
Basophils Relative: 1.4 % (ref 0.0–3.0)
Eosinophils Absolute: 0.1 10*3/uL (ref 0.0–0.7)
Eosinophils Relative: 1.5 % (ref 0.0–5.0)
HCT: 38.9 % (ref 36.0–46.0)
Hemoglobin: 13.1 g/dL (ref 12.0–15.0)
Lymphocytes Relative: 45.9 % (ref 12.0–46.0)
Lymphs Abs: 2.4 10*3/uL (ref 0.7–4.0)
MCHC: 33.8 g/dL (ref 30.0–36.0)
MCV: 93.3 fl (ref 78.0–100.0)
Monocytes Absolute: 0.5 10*3/uL (ref 0.1–1.0)
Monocytes Relative: 9.3 % (ref 3.0–12.0)
Neutro Abs: 2.2 10*3/uL (ref 1.4–7.7)
Neutrophils Relative %: 41.9 % — ABNORMAL LOW (ref 43.0–77.0)
Platelets: 278 10*3/uL (ref 150.0–400.0)
RBC: 4.17 Mil/uL (ref 3.87–5.11)
RDW: 13.3 % (ref 11.5–15.5)
WBC: 5.2 10*3/uL (ref 4.0–10.5)

## 2022-05-19 LAB — HIGH SENSITIVITY CRP: CRP, High Sensitivity: 2.38 mg/L (ref 0.000–5.000)

## 2022-05-19 LAB — SEDIMENTATION RATE: Sed Rate: 23 mm/hr (ref 0–30)

## 2022-05-19 MED ORDER — HYOSCYAMINE SULFATE 0.125 MG PO TABS: 0.1250 mg | ORAL_TABLET | Freq: Four times a day (QID) | ORAL | 1 refills | Status: DC | PRN

## 2022-05-19 MED ORDER — FAMOTIDINE 20 MG PO TABS: 20.0000 mg | ORAL_TABLET | Freq: Two times a day (BID) | ORAL | 0 refills | Status: DC

## 2022-05-19 NOTE — Progress Notes (Signed)
Addendum: Reviewed and agree with assessment and management plan. Ariba Lehnen M, MD  

## 2022-05-19 NOTE — Patient Instructions (Signed)
If you are age 61 or younger, your body mass index should be between 19-25. Your Body mass index is 23.04 kg/m. If this is out of the aformentioned range listed, please consider follow up with your Primary Care Provider.  ________________________________________________________  The [facility] providers would like to encourage you to use [facility] to communicate with providers for non-urgent requests or questions.  Due to long hold times on the telephone, sending your provider a message by [facility] : [facility] may be a faster and more efficient way to get a response.  Please allow 48 business hours for a response.  Please remember that this is for non-urgent requests.  _______________________________________________________ Your provider has requested that you go to the basement level for lab work before leaving today. Press "B" on the elevator. The lab is located at the first door on the left as you exit the elevator.  Your provider has requested that you go to the basement level for an Xray of your abdomen before leaving today.   Can do famotidine 20 mg twice a day   Get off omeprazole and turn in stools samples 2 weeks off medication.   You have been scheduled for an abdominal ultrasound at [facility] (1st floor of hospital) on 05-23-22 at 8:30am. Please arrive 30 minutes prior to your appointment for registration. Make certain not to have anything to eat or drink 6 to 8 hours prior to your appointment. Should you need to reschedule your appointment, please contact radiology at [phone]. This test typically takes about 30 minutes to perform.  Due to recent changes in healthcare laws, you may see the results of your imaging and laboratory studies on MyChart before your provider has had a chance to review them.  We understand that in some cases there may be results that are confusing or concerning to you. Not all laboratory results come back in the same time frame and the provider may be waiting for  multiple results in order to interpret others.  Please give [place] 48 hours in order for your provider to thoroughly review all the results before contacting the office for clarification of your results.    You are scheduled to follow up in our office on 06-24-22 at 10:00am  Avoid spicy and acidic foods Avoid fatty foods Limit your intake of coffee, tea, alcohol, and carbonated drinks Work to maintain a healthy weight Keep the head of the bed elevated at least 3 inches with blocks or a wedge pillow if you are having any nighttime symptoms Stay upright for 2 hours after eating Avoid meals and snacks three to four hours before bedtime  Please try low FODMAP diet- see below- start with just one column at a time.  First do a trial off milk/lactose products if you use them.  Add fiber like benefiber or citracel once a day Increase activity Can do trial of IBGard for AB pain- Take 1-2 capsules once a day for maintence or twice a day during a flare Will also send in an anti spasm medication to take as needed Please try to decrease stress. consider talking with PCP about anti anxiety medication or try head space app for meditation. if any worsening symptoms like blood in stool, weight loss, please call the office or go to the ER.     FODMAP stands for fermentable oligo-, di-, mono-saccharides and polyols (1). These are the scientific terms used to classify groups of carbs that are notorious for triggering digestive symptoms like bloating, gas and stomach  pain.     Thank you for entrusting me with your care and choosing [facility].  Vicie Mutters, PA-C  .

## 2022-05-20 ENCOUNTER — Other Ambulatory Visit: Payer: Self-pay

## 2022-05-20 LAB — IGA: Immunoglobulin A: 247 mg/dL (ref 47–310)

## 2022-05-20 LAB — TISSUE TRANSGLUTAMINASE, IGA: (tTG) Ab, IgA: <1 U/mL

## 2022-05-20 NOTE — Progress Notes (Signed)
Addendum: Reviewed and agree with assessment and management plan. Sophiamarie Nease M, MD  

## 2022-05-20 NOTE — Telephone Encounter (Signed)
Left message for patient to call back  

## 2022-05-21 NOTE — Telephone Encounter (Signed)
Left message for patient to call back  

## 2022-05-22 ENCOUNTER — Encounter: Payer: Self-pay | Admitting: Internal Medicine

## 2022-05-22 ENCOUNTER — Other Ambulatory Visit: Payer: Self-pay

## 2022-05-22 DIAGNOSIS — K625 Hemorrhage of anus and rectum: Secondary | ICD-10-CM

## 2022-05-22 DIAGNOSIS — K219 Gastro-esophageal reflux disease without esophagitis: Secondary | ICD-10-CM

## 2022-05-22 DIAGNOSIS — R197 Diarrhea, unspecified: Secondary | ICD-10-CM

## 2022-05-22 DIAGNOSIS — K6289 Other specified diseases of anus and rectum: Secondary | ICD-10-CM

## 2022-05-22 DIAGNOSIS — Z8619 Personal history of other infectious and parasitic diseases: Secondary | ICD-10-CM

## 2022-05-22 MED ORDER — NA SULFATE-K SULFATE-MG SULF 17.5-3.13-1.6 GM/177ML PO SOLN: 1.0000 | Freq: Once | ORAL | 0 refills | Status: AC

## 2022-05-22 NOTE — Telephone Encounter (Signed)
Patient returned call. She has been scheduled for endo colon in [place] with Dr. Hilarie Fredrickson on 06/09/22 at 3:30 pm. Amb ref placed. Pt is not on blood thinners or diabetic. Prep sent to pharmacy & pt plans to come by office to pick up instructions.

## 2022-05-22 NOTE — Telephone Encounter (Signed)
Left message for patient to call back  

## 2022-05-23 ENCOUNTER — Ambulatory Visit (HOSPITAL_COMMUNITY): Admission: RE | Admit: 2022-05-23 | Payer: Commercial Managed Care - HMO | Source: Ambulatory Visit

## 2022-05-30 ENCOUNTER — Ambulatory Visit (HOSPITAL_COMMUNITY)
Admission: RE | Admit: 2022-05-30 | Discharge: 2022-05-30 | Disposition: A | Discharge status: Home / Self Care | Payer: Commercial Managed Care - HMO | Source: Ambulatory Visit | Attending: Physician Assistant | Admitting: Physician Assistant

## 2022-05-30 DIAGNOSIS — K219 Gastro-esophageal reflux disease without esophagitis: Secondary | ICD-10-CM | POA: Insufficient documentation

## 2022-06-09 ENCOUNTER — Encounter: Payer: 59 | Admitting: Internal Medicine

## 2022-06-09 ENCOUNTER — Telehealth: Payer: Self-pay | Admitting: Internal Medicine

## 2022-06-09 NOTE — Telephone Encounter (Signed)
PT has called in to cancel colonoscopy. PT did not prep at all and had already eaten today. Rescheduled appointment for 07/28/2022

## 2022-06-23 NOTE — Progress Notes (Unsigned)
06/23/2022 Gail Gonzalez [id number] 04-26-1961  Referring provider: Holley Bouche, MD Primary GI doctor: Dr. Hilarie Fredrickson  ASSESSMENT AND PLAN:    Gastroesophageal reflux disease, unspecified whether esophagitis present With history of H. pylori treated with Pylera never had eradication test With diarrhea, reflux with history of H. pylori unable to get off proton pump inhibitor we will plan for endoscopic evaluation. I recommend upper gastrointestinal and colorectal evaluation with an EGD and colonoscopy. Risk of bowel prep, conscious sedation, and EGD and colonoscopy were discussed. Risks include but are not limited to dehydration, pain, bleeding, cardiopulmonary process, bowel perforation, or other possible adverse outcomes.. Treatment plan was discussed with patient, and agreed upon.  Diverticulosis of colon without hemorrhage Will call if any symptoms. Add on fiber supplement, avoid NSAIDS, information given  Diarrhea with history of alternating constipation/diarrhea No stool burden seen on KUB, with diarrhea 3-4 times daily, patient is due for colonoscopy in less than a year we will plan to do colonoscopy with endoscopy Instruct patient get on fiber, IBgard, FODMAP given, check labs. Did not return stool studies, if colonoscopy negative can check pancreatic elastase and or test for SIBO   Fatty liver --Continue to work on risk factor modification including diet exercise and control of risk factors including blood sugars. - monitor q 6 months.   Patient Care Team: Holley Bouche, MD as PCP - General (Family Medicine)  HISTORY OF PRESENT ILLNESS: 61 y.o. female with a past medical history of anxiety, endometriosis 2013, GERD, diverticulosis and others listed below presents for evaluation of bloating, nausea, diarrhea, GERD.   Patient was seen 05/19/2022 for bloating, nausea diarrhea and GERD.  At first patient did not want to proceed with an endoscopic evaluation but called  back stating she would not be able to get off her proton pump inhibitor.   Scheduled for EGD colonoscopy however patient arrived at the colonoscopy unprepped and have all ready eaten.  Has history of H. pylori gastritis, never had eradication study. Labs showed negative celiac, normal  kidney/liver, unremarkable CRP/sed rate, CBC without anemia or leukocyotosis She did not complete stool studies.  She is rescheduled for Aug 12, 2023.  Her RUQ US showed no acute cholecystitis or cholelithiasis, did show fatty liver.  No elevation of LFTS, she is not diabetic, her mom had cirrhosis but was ETOH. Patient does drink rare wine but stopped smoking/ETOH 2 years ago   States she continues to have diarrhea, worse with any food, 2-3 x a day, maybe more.  She  denies  fever, chills, unintentional weight loss.  AB cramping with BM, improved afterwards.  She has fecal urgency, denies fecal incontinence. Denies any further hematochezia.  She states she is just on omeprazole once a day, improved on twice a day but due to fear of bone loss.   04/21/2013 EGD with heartburn, dyspepsia and dysphagia nonobstructing Schatzki ring gastroesophageal junction, gastropathy entire stomach, single nonbleeding ulcer 3 x 5 mm duodenal bulb. Tested positive for H. pylori given Pylera.   03/24/2013 colonoscopy screening purposes good prep with movi prep 3 mm polyp sessile, mild diverticulosis sigmoid colon, moderate internal and external hemorrhoids recall 10 years.   Current Medications:     Current Outpatient Medications (Respiratory):    sodium chloride (OCEAN) 0.65 % SOLN nasal spray, Place 1 spray into both nostrils as needed for congestion.    Current Outpatient Medications (Other):    baclofen (LIORESAL) 10 MG tablet, Take 0.5 tablets (5 mg total) by mouth 2 (two) times  daily.   COVID-19 At Home Antigen Test KIT, Use as directed.   diclofenac Sodium (VOLTAREN) 1 % GEL, Apply 4 g topically 4 (four) times daily.    escitalopram (LEXAPRO) 10 MG tablet, Take 1 tablet (10 mg total) by mouth daily.   famotidine (PEPCID) 20 MG tablet, Take 1 tablet (20 mg total) by mouth 2 (two) times daily.   hydrOXYzine (ATARAX) 10 MG tablet, Take 0.5 tablets (5 mg total) by mouth 3 (three) times daily as needed.   hyoscyamine (LEVSIN) 0.125 MG tablet, Take 1 tablet (0.125 mg total) by mouth every 6 (six) hours as needed for cramping.   Multiple Vitamin (MULTIVITAMIN) capsule, Take 1 capsule by mouth daily.   omeprazole (PRILOSEC) 20 MG capsule, TAKE 1 CAPSULE(20 MG) BY MOUTH DAILY  Medical History:  Past Medical History:  Diagnosis Date   Anemia    Anxiety    Arthritis    Cardiac arrhythmia due to congenital heart disease    Chronic back pain    Colon polyps 04/24/2013   Diverticulosis 02/24/2013   Endometriosis    on lap surgery. seen by Dr. Mancel Bale   GERD (gastroesophageal reflux disease)    Heart murmur    Hemorrhoids    Rectal bleeding    Thyroid disease 10/27/2010   nodule   Allergies: No Known Allergies   Surgical History:  She  has a past surgical history that includes Biopsy thyroid (2013); Diagnostic laparoscopy (1990); and Breast biopsy (Left). Family History:  Her family history includes Alcohol abuse in her father and mother; Cancer in her mother; Colon cancer in her maternal uncle; Colon polyps in her mother; Diabetes in her mother; Hypertension in her mother; Prostate cancer in her brother; Uterine cancer in her maternal aunt. Social History:   reports that she has been smoking cigarettes. She has a 3.75 pack-year smoking history. She has never used smokeless tobacco. She reports current alcohol use of about 4.0 standard drinks of alcohol per week. She reports that she does not use drugs.  REVIEW OF SYSTEMS  : All other systems reviewed and negative except where noted in the History of Present Illness.   PHYSICAL EXAM: There were no vitals taken for this visit. General:   Pleasant, well  developed female in no acute distress Head:   Normocephalic and atraumatic. Eyes:  sclerae anicteric,conjunctive pink  Heart:   regular rate and rhythm Pulm:  Clear anteriorly; no wheezing Abdomen:   Soft, Obese AB, Active bowel sounds. mild tenderness in the epigastrium. Without guarding and Without rebound, No organomegaly appreciated. Rectal: declines Extremities:  Without edema. Msk: Symmetrical without gross deformities. Peripheral pulses intact.  Neurologic:  Alert and  oriented x4;  No focal deficits.  Skin:   Dry and intact without significant lesions or rashes. Psychiatric:  Cooperative. Normal mood and affect.    Vladimir Crofts, PA-C 8:49 AM

## 2022-06-24 ENCOUNTER — Encounter: Payer: Self-pay | Admitting: Physician Assistant

## 2022-06-24 ENCOUNTER — Ambulatory Visit (INDEPENDENT_AMBULATORY_CARE_PROVIDER_SITE_OTHER): Payer: Self-pay | Admitting: Physician Assistant

## 2022-06-24 VITALS — BP 100/60 | HR 62 | Ht 69.0 in | Wt 158.0 lb

## 2022-06-24 DIAGNOSIS — Z8619 Personal history of other infectious and parasitic diseases: Secondary | ICD-10-CM

## 2022-06-24 DIAGNOSIS — R197 Diarrhea, unspecified: Secondary | ICD-10-CM

## 2022-06-24 DIAGNOSIS — R1310 Dysphagia, unspecified: Secondary | ICD-10-CM

## 2022-06-24 DIAGNOSIS — K5730 Diverticulosis of large intestine without perforation or abscess without bleeding: Secondary | ICD-10-CM

## 2022-06-24 DIAGNOSIS — K625 Hemorrhage of anus and rectum: Secondary | ICD-10-CM

## 2022-06-24 MED ORDER — NA SULFATE-K SULFATE-MG SULF 17.5-3.13-1.6 GM/177ML PO SOLN
1.0000 | Freq: Once | ORAL | 0 refills | Status: AC
Start: 2022-06-24 — End: 2022-06-24

## 2022-06-24 NOTE — Patient Instructions (Addendum)
You have been scheduled for an endoscopy and colonoscopy. Please follow the written instructions given to you at your visit today. Please pick up your prep supplies at the pharmacy within the next 1-3 days. If you use inhalers (even only as needed), please bring them with you on the day of your procedure.  Due to recent changes in healthcare laws, you may see the results of your imaging and laboratory studies on MyChart before your provider has had a chance to review them.  We understand that in some cases there may be results that are confusing or concerning to you. Not all laboratory results come back in the same time frame and the provider may be waiting for multiple results in order to interpret others.  Please give [place] 48 hours in order for your provider to thoroughly review all the results before contacting the office for clarification of your results.   The [facility] providers would like to encourage you to use [facility] to communicate with providers for non-urgent requests or questions.  Due to long hold times on the telephone, sending your provider a message by [facility] may be a faster and more efficient way to get a response.  Please allow 48 business hours for a response.  Please remember that this is for non-urgent requests.    Please take your proton pump inhibitor medication, can do twice a day for a month and then back to once a day.   Please take this medication 30 minutes to 1 hour before meals- this makes it more effective.  Avoid spicy and acidic foods Avoid fatty foods Limit your intake of coffee, tea, alcohol, and carbonated drinks Work to maintain a healthy weight Keep the head of the bed elevated at least 3 inches with blocks or a wedge pillow if you are having any nighttime symptoms Stay upright for 2 hours after eating Avoid meals and snacks three to four hours before bedtime  Metabolic dysfunction associated seatohepatitis  Now the leading cause of liver failure in the  united states.  It is normally from such risk factors as obesity, diabetes, insulin resistance, high cholesterol, or metabolic syndrome.  The only definitive therapy is weight loss and exercise.   Suggest walking 20-30 mins daily.  Decreasing carbohydrates, increasing veggies.  Vitamin E '800mg'$  once a day   Fatty Liver Fatty liver is the accumulation of fat in liver cells. It is also called hepatosteatosis or steatohepatitis. It is normal for your liver to contain some fat. If fat is more than 5 to 10% of your liver's weight, you have fatty liver.  There are often no symptoms (problems) for years while damage is still occurring. People often learn about their fatty liver when they have medical tests for other reasons. Fat can damage your liver for years or even decades without causing problems. When it becomes severe, it can cause fatigue, weight loss, weakness, and confusion. This makes you more likely to develop more serious liver problems. The liver is the largest organ in the body. It does a lot of work and often gives no warning signs when it is sick until late in a disease. The liver has many important jobs including: Breaking down foods. Storing vitamins, iron, and other minerals. Making proteins. Making bile for food digestion. Breaking down many products including medications, alcohol and some poisons.  PROGNOSIS  Fatty liver may cause no damage or it can lead to an inflammation of the liver. This is, called steatohepatitis.  Over time the  liver may become scarred and hardened. This condition is called cirrhosis. Cirrhosis is serious and may lead to liver failure or cancer. NASH is one of the leading causes of cirrhosis. About 10-20% of Americans have fatty liver and a smaller 2-5% has NASH.  TREATMENT  Weight loss, fat restriction, and exercise in overweight patients produces inconsistent results but is worth trying. Good control of diabetes may reduce fatty liver. Eat a balanced,  healthy diet. Increase your physical activity. There are no medical or surgical treatments for a fatty liver or NASH, but improving your diet and increasing your exercise may help prevent or reverse some of the damage.

## 2022-06-25 NOTE — Progress Notes (Signed)
Addendum: Reviewed and agree with assessment and management plan. Erique Kaser M, MD  

## 2022-06-27 ENCOUNTER — Ambulatory Visit (INDEPENDENT_AMBULATORY_CARE_PROVIDER_SITE_OTHER): Payer: Commercial Managed Care - HMO | Admitting: Student

## 2022-06-27 ENCOUNTER — Encounter: Payer: Self-pay | Admitting: Student

## 2022-06-27 VITALS — BP 124/60 | HR 70 | Ht 69.0 in | Wt 159.0 lb

## 2022-06-27 DIAGNOSIS — E785 Hyperlipidemia, unspecified: Secondary | ICD-10-CM | POA: Diagnosis not present

## 2022-06-27 DIAGNOSIS — E041 Nontoxic single thyroid nodule: Secondary | ICD-10-CM | POA: Diagnosis not present

## 2022-06-27 LAB — POCT GLYCOSYLATED HEMOGLOBIN (HGB A1C): Hemoglobin A1C: 5.5 % (ref 4.0–5.6)

## 2022-06-27 NOTE — Patient Instructions (Signed)
It was great to see you! Thank you for allowing me to participate in your care!  I recommend that you always bring your medications to each appointment as this makes it easy to ensure we are on the correct medications and helps [place] not miss when refills are needed.  Our plans for today:  - TSH level check to see how thyroid is doing - Referral for biopsy for specialist here in [place]    Take care and seek immediate care sooner if you develop any concerns.   Dr. Holley Bouche, MD [place]

## 2022-06-27 NOTE — Progress Notes (Signed)
  SUBJECTIVE:   CHIEF COMPLAINT / HPI:   History of nontoxic multinodular goiter, last tsh normal 11/22/19 2 Thyroid nodules on ultrasound, one needing fine needle biops in superior right thyroid, one inferior right noted as benign, both seen in 2017. Largest was biopsied in 2012 and benign, last [place] 2017, where FNA was recommended. Last seen in Endo office 2017 [facility] - Dr. Moishe Spice  Wants to get biopsy referral for someone in [place].   Issues with stomach, following with GI, going for EGD and colonoscopy, and fatty liver on [place].   Quit smoking in November and gained 30 lbs since then.   PERTINENT  PMH / PSH: tobacco abuse, multinodular goiter   OBJECTIVE:  BP 124/60   Pulse 70   Ht '5\' 9"'$  (1.753 m)   Wt 159 lb (72.1 kg)   SpO2 99%   BMI 23.48 kg/m   General: NAD, pleasant, able to participate in exam Cardiac: RRR, no murmurs auscultated. Respiratory: CTAB, normal effort, no wheezes, rales or rhonchi Abdomen: soft, non-tender, non-distended, normoactive bowel sounds Physical Exam Neck:     Thyroid: No thyroid mass, thyromegaly or thyroid tenderness.     Comments: Thyroid normal in palpation, did not appreciate nodules or overly large size Lymphadenopathy:     Cervical: No cervical adenopathy.     ASSESSMENT/PLAN:  Thyroid nodule Patient had thyroid US suggesting f/u with biopsy for 1/2 nodules, 2nd is benign. Patient denies any signs/symptoms of hyperthyroid/hypothyroid but does appreciate a 30 lb weight gain since stopping smoking last November. Thyroid exam wnl, did not appreciate nodules.  -Amb Ref IR for Fine needle biopsy -TSH   Orders Placed This Encounter  Procedures   TSH Rfx on Abnormal to Free T4   Lipid Panel   Ambulatory referral to Interventional Radiology    Referral Priority:   Routine    Referral Type:   Consultation    Referral Reason:   Specialty Services Required    Requested Specialty:   Interventional Radiology    Number of Visits  Requested:   1   HgB A1c   Health maintenance: Patient had Pap Smear by gny last year - 01/02/21 - Dr. Everett Graff, can't see results Mammogram This year Checking cholesterol level and a1c No orders of the defined types were placed in this encounter.  No follow-ups on file. '@SIGNNOTE'$ @

## 2022-06-27 NOTE — Assessment & Plan Note (Addendum)
Patient had thyroid US suggesting f/u with biopsy for 1/2 nodules, 2nd is benign. Patient denies any signs/symptoms of hyperthyroid/hypothyroid but does appreciate a 30 lb weight gain since stopping smoking last November. Thyroid exam wnl, did not appreciate nodules.  -Amb Ref IR for Fine needle biopsy -TSH

## 2022-06-28 LAB — LIPID PANEL
Chol/HDL Ratio: 5.3 ratio — ABNORMAL HIGH (ref 0.0–4.4)
Cholesterol, Total: 234 mg/dL — ABNORMAL HIGH (ref 100–199)
HDL: 44 mg/dL (ref >39)
LDL Chol Calc (NIH): 150 mg/dL — ABNORMAL HIGH (ref 0–99)
Triglycerides: 217 mg/dL — ABNORMAL HIGH (ref 0–149)
VLDL Cholesterol Cal: 40 mg/dL (ref 5–40)

## 2022-06-28 LAB — TSH RFX ON ABNORMAL TO FREE T4: TSH: 1.25 u[IU]/mL (ref 0.450–4.500)

## 2022-07-02 ENCOUNTER — Telehealth: Payer: Self-pay

## 2022-07-02 NOTE — Telephone Encounter (Signed)
Patient calls nurse line requesting a prescription for Vitamin E.   Patient reports at last visit she was advised to start taking this for "fatty liver."  Will forward to PCP.

## 2022-07-03 ENCOUNTER — Other Ambulatory Visit: Payer: Self-pay | Admitting: Student

## 2022-07-04 NOTE — Telephone Encounter (Signed)
Patient calls nurse line again in regards to Vitamin E.   If patient needs to purchase OTC please suggest dosage.

## 2022-07-07 ENCOUNTER — Encounter: Payer: Self-pay | Admitting: Internal Medicine

## 2022-07-08 ENCOUNTER — Other Ambulatory Visit: Payer: Self-pay | Admitting: Student

## 2022-07-08 DIAGNOSIS — E041 Nontoxic single thyroid nodule: Secondary | ICD-10-CM

## 2022-07-09 ENCOUNTER — Telehealth: Payer: Self-pay | Admitting: Student

## 2022-07-09 ENCOUNTER — Other Ambulatory Visit: Payer: Self-pay | Admitting: Student

## 2022-07-09 MED ORDER — ROSUVASTATIN CALCIUM 5 MG PO TABS: 5.0000 mg | ORAL_TABLET | Freq: Every day | ORAL | 0 refills | Status: DC

## 2022-07-09 NOTE — Telephone Encounter (Signed)
I've attempted to call patient, but have not gotten through. Patient does not need Vitamin E. Patient will need to start a statin though, as cholesterol panel was elevated. I will send one in to pharmacy for patient to take daily.

## 2022-07-09 NOTE — Telephone Encounter (Signed)
Called patient to discuss lipid panel. Her cholesterol levels are elevated and she needs to be started on a statin (medicine to lower cholesterol). I have sent in a Rx for crestor, patient is to start taking this daily.  Patient did not answer phone, will attempt to call back.

## 2022-07-10 ENCOUNTER — Telehealth: Payer: Self-pay | Admitting: Student

## 2022-07-10 NOTE — Telephone Encounter (Signed)
Called to discuss starting statin in patient for high cholesterol. See previous phone note.

## 2022-07-14 ENCOUNTER — Encounter: Payer: Self-pay | Admitting: Internal Medicine

## 2022-07-14 ENCOUNTER — Ambulatory Visit (AMBULATORY_SURGERY_CENTER): Payer: Commercial Managed Care - HMO | Admitting: Internal Medicine

## 2022-07-14 VITALS — BP 121/80 | HR 53 | Temp 97.1°F | Resp 11 | Ht 69.0 in | Wt 158.0 lb

## 2022-07-14 DIAGNOSIS — K529 Noninfective gastroenteritis and colitis, unspecified: Secondary | ICD-10-CM

## 2022-07-14 DIAGNOSIS — K5730 Diverticulosis of large intestine without perforation or abscess without bleeding: Secondary | ICD-10-CM

## 2022-07-14 DIAGNOSIS — Z8619 Personal history of other infectious and parasitic diseases: Secondary | ICD-10-CM | POA: Diagnosis not present

## 2022-07-14 DIAGNOSIS — K319 Disease of stomach and duodenum, unspecified: Secondary | ICD-10-CM | POA: Diagnosis not present

## 2022-07-14 DIAGNOSIS — K209 Esophagitis, unspecified without bleeding: Secondary | ICD-10-CM

## 2022-07-14 DIAGNOSIS — K219 Gastro-esophageal reflux disease without esophagitis: Secondary | ICD-10-CM

## 2022-07-14 DIAGNOSIS — K3189 Other diseases of stomach and duodenum: Secondary | ICD-10-CM | POA: Diagnosis not present

## 2022-07-14 DIAGNOSIS — K648 Other hemorrhoids: Secondary | ICD-10-CM

## 2022-07-14 DIAGNOSIS — D123 Benign neoplasm of transverse colon: Secondary | ICD-10-CM | POA: Diagnosis not present

## 2022-07-14 DIAGNOSIS — R197 Diarrhea, unspecified: Secondary | ICD-10-CM

## 2022-07-14 MED ORDER — SODIUM CHLORIDE 0.9 % IV SOLN: 500.0000 mL | Freq: Once | INTRAVENOUS | Status: DC

## 2022-07-14 NOTE — Patient Instructions (Addendum)
Resume previous medications.  2 polyps removed and sent to pathology.  Await results for final recommendations.    Handouts on findings given to patient.   (Polyps, diverticulosis, hemorrhoids)  YOU HAD AN ENDOSCOPIC PROCEDURE TODAY AT [facility]:   Refer to the procedure report that was given to you for any specific questions about what was found during the examination.  If the procedure report does not answer your questions, please call your gastroenterologist to clarify.  If you requested that your care partner not be given the details of your procedure findings, then the procedure report has been included in a sealed envelope for you to review at your convenience later.  YOU SHOULD EXPECT: Some feelings of bloating in the abdomen. Passage of more gas than usual.  Walking can help get rid of the air that was put into your GI tract during the procedure and reduce the bloating. If you had a lower endoscopy (such as a colonoscopy or flexible sigmoidoscopy) you may notice spotting of blood in your stool or on the toilet paper. If you underwent a bowel prep for your procedure, you may not have a normal bowel movement for a few days.  Please Note:  You might notice some irritation and congestion in your nose or some drainage.  This is from the oxygen used during your procedure.  There is no need for concern and it should clear up in a day or so.  SYMPTOMS TO REPORT IMMEDIATELY:  Following lower endoscopy (colonoscopy or flexible sigmoidoscopy):  Excessive amounts of blood in the stool  Significant tenderness or worsening of abdominal pains  Swelling of the abdomen that is new, acute  Fever of 100F or higher  Following upper endoscopy (EGD)  Vomiting of blood or coffee ground material  New chest pain or pain under the shoulder blades  Painful or persistently difficult swallowing  New shortness of breath  Fever of 100F or higher  Black, tarry-looking stools  For urgent or  emergent issues, a gastroenterologist can be reached at any hour by calling [phone]. Do not use MyChart messaging for urgent concerns.    DIET:  We do recommend a small meal at first, but then you may proceed to your regular diet.  Drink plenty of fluids but you should avoid alcoholic beverages for 24 hours.  ACTIVITY:  You should plan to take it easy for the rest of today and you should NOT DRIVE or use heavy machinery until tomorrow (because of the sedation medicines used during the test).    FOLLOW UP: Our staff will call the number listed on your records the next business day following your procedure.  We will call around 7:15- 8:00 am to check on you and address any questions or concerns that you may have regarding the information given to you following your procedure. If we do not reach you, we will leave a message.     If any biopsies were taken you will be contacted by phone or by letter within the next 1-3 weeks.  Please call us at [phone] if you have not heard about the biopsies in 3 weeks.    SIGNATURES/CONFIDENTIALITY: You and/or your care partner have signed paperwork which will be entered into your electronic medical record.  These signatures attest to the fact that that the information above on your After Visit Summary has been reviewed and is understood.  Full responsibility of the confidentiality of this discharge information lies with you and/or your  care-partner.  

## 2022-07-14 NOTE — Progress Notes (Signed)
Pt sleeping. VSS. Report given to RN. SBAR complete. Questions answered. Airway intact.

## 2022-07-14 NOTE — Progress Notes (Signed)
See office note dated 06/24/2022 for details and current H&P  Patient presenting for upper endoscopy to evaluate GERD, history of H. pylori, alternating bowel habits  She remains appropriate for upper and lower endoscopy in the [place] today.

## 2022-07-14 NOTE — Op Note (Signed)
[place] Patient Name: Gail Gonzalez Procedure Date: 07/14/2022 3:05 PM MRN: [id number] Endoscopist: Jerene Bears , MD Age: 61 Referring MD:  Date of Birth: 09-Jul-1961 Gender: Female Account #: [id number] Procedure:                Colonoscopy Indications:              Clinically significant diarrhea of unexplained                            origin Medicines:                Monitored Anesthesia Care Procedure:                Pre-Anesthesia Assessment:                           - Prior to the procedure, a History and Physical                            was performed, and patient medications and                            allergies were reviewed. The patient's tolerance of                            previous anesthesia was also reviewed. The risks                            and benefits of the procedure and the sedation                            options and risks were discussed with the patient.                            All questions were answered, and informed consent                            was obtained. Prior Anticoagulants: The patient has                            taken no previous anticoagulant or antiplatelet                            agents. ASA Grade Assessment: II - A patient with                            mild systemic disease. After reviewing the risks                            and benefits, the patient was deemed in                            satisfactory condition to undergo the procedure.  After obtaining informed consent, the colonoscope                            was passed under direct vision. Throughout the                            procedure, the patient's blood pressure, pulse, and                            oxygen saturations were monitored continuously. The                            PCF-HQ190L Colonoscope was introduced through the                            anus and advanced to the cecum, identified by                             appendiceal orifice and ileocecal valve. The                            colonoscopy was performed without difficulty. The                            patient tolerated the procedure well. The quality                            of the bowel preparation was good. The ileocecal                            valve, appendiceal orifice, and rectum were                            photographed. Scope In: 3:39:36 PM Scope Out: 3:56:21 PM Scope Withdrawal Time: 0 hours 12 minutes 13 seconds  Total Procedure Duration: 0 hours 16 minutes 45 seconds  Findings:                 The digital rectal exam was normal.                           Two sessile polyps were found in the transverse                            colon. The polyps were 4 to 5 mm in size. These                            polyps were removed with a cold snare. Resection                            and retrieval were complete.                           Multiple small and large-mouthed diverticula were  found in the sigmoid colon and descending colon.                           Internal hemorrhoids were found during                            retroflexion. The hemorrhoids were small.                           The exam was otherwise without abnormality.                           Biopsies for histology were taken with a cold                            forceps from the right colon and left colon for                            evaluation of microscopic colitis. Complications:            No immediate complications. Estimated Blood Loss:     Estimated blood loss was minimal. Impression:               - Two 4 to 5 mm polyps in the transverse colon,                            removed with a cold snare. Resected and retrieved.                           - Mild diverticulosis in the sigmoid colon and in                            the descending colon.                           - Small internal hemorrhoids.                            - The examination was otherwise normal.                           - Biopsies were taken with a cold forceps from the                            right colon and left colon for evaluation of                            microscopic colitis. Recommendation:           - Patient has a contact number available for                            emergencies. The signs and symptoms of potential  delayed complications were discussed with the                            patient. Return to normal activities tomorrow.                            Written discharge instructions were provided to the                            patient.                           - Resume previous diet.                           - Continue present medications.                           - Await pathology results. If negative for                            microscopic colitis, then consider rifaximin for                            IBS-D.                           - Repeat colonoscopy is recommended. The                            colonoscopy date will be determined after pathology                            results from today's exam become available for                            review. Jerene Bears, MD 07/14/2022 4:08:18 PM This report has been signed electronically.

## 2022-07-14 NOTE — Progress Notes (Signed)
Called to room to assist during endoscopic procedure.  Patient ID and intended procedure confirmed with present staff. Received instructions for my participation in the procedure from the performing physician.  

## 2022-07-14 NOTE — Op Note (Signed)
[place] Patient Name: Gail Gonzalez Procedure Date: 07/14/2022 3:18 PM MRN: [id number] Endoscopist: Jerene Bears , MD Age: 61 Referring MD:  Date of Birth: [birth year] Gender: Female Account #: [id number] Procedure:                Upper GI endoscopy Indications:              Gastro-esophageal reflux disease, Exclusion of                            Helicobacter pylori given history of prior                            treatment, Abdominal bloating, Diarrhea; currently                            on famotidine though she feels omeprazole works                            better for her GERD symptoms Medicines:                Monitored Anesthesia Care Procedure:                Pre-Anesthesia Assessment:                           - Prior to the procedure, a History and Physical                            was performed, and patient medications and                            allergies were reviewed. The patient's tolerance of                            previous anesthesia was also reviewed. The risks                            and benefits of the procedure and the sedation                            options and risks were discussed with the patient.                            All questions were answered, and informed consent                            was obtained. Prior Anticoagulants: The patient has                            taken no previous anticoagulant or antiplatelet                            agents. ASA Grade Assessment: II - A patient with  mild systemic disease. After reviewing the risks                            and benefits, the patient was deemed in                            satisfactory condition to undergo the procedure.                           After obtaining informed consent, the endoscope was                            passed under direct vision. Throughout the                            procedure, the patient's blood pressure,  pulse, and                            oxygen saturations were monitored continuously. The                            GIF [id number] #[id number] was introduced through the                            mouth, and advanced to the second part of duodenum.                            The upper GI endoscopy was accomplished without                            difficulty. The patient tolerated the procedure                            well. Scope In: Scope Out: Findings:                 Mild mucosal changes characterized by longitudinal                            markings and tight circumferential folds were found                            in the middle third of the esophagus and in the                            lower third of the esophagus. Biopsies were taken                            with a cold forceps for histology.                           The entire examined stomach was normal. Biopsies                            were  taken with a cold forceps for histology and                            Helicobacter pylori testing.                           The examined duodenum was normal. Biopsies for                            histology were taken with a cold forceps for                            evaluation of celiac disease. Complications:            No immediate complications. Estimated Blood Loss:     Estimated blood loss was minimal. Impression:               - Longitudinally marked, with circumferential folds                            in the mid and distal esophagus. Biopsied.                           - Normal stomach. Biopsied.                           - Normal examined duodenum. Biopsied. Recommendation:           - Patient has a contact number available for                            emergencies. The signs and symptoms of potential                            delayed complications were discussed with the                            patient. Return to normal activities tomorrow.                             Written discharge instructions were provided to the                            patient.                           - Resume previous diet.                           - Continue present medications. May resume PPI but                            want to review pathology results first. Also                            consideration of SIBO treatment based on pathology  results.                           - Await pathology results.                           - See the other procedure note for documentation of                            additional recommendations. Jerene Bears, MD 07/14/2022 4:02:16 PM This report has been signed electronically.

## 2022-07-15 ENCOUNTER — Telehealth: Payer: Self-pay | Admitting: *Deleted

## 2022-07-15 NOTE — Telephone Encounter (Signed)
Follow up call attempt.  LVM to call with any questions or concerns. 

## 2022-07-22 ENCOUNTER — Other Ambulatory Visit: Payer: Self-pay

## 2022-07-22 MED ORDER — RIFAXIMIN 550 MG PO TABS: 550.0000 mg | ORAL_TABLET | Freq: Three times a day (TID) | ORAL | 0 refills | Status: DC

## 2022-07-25 ENCOUNTER — Other Ambulatory Visit: Payer: Self-pay | Admitting: Student

## 2022-07-25 ENCOUNTER — Other Ambulatory Visit: Payer: Self-pay | Admitting: Physician Assistant

## 2022-07-28 ENCOUNTER — Encounter: Payer: Commercial Managed Care - HMO | Admitting: Internal Medicine

## 2022-08-09 ENCOUNTER — Other Ambulatory Visit: Payer: Self-pay | Admitting: Family Medicine

## 2022-08-15 ENCOUNTER — Other Ambulatory Visit: Payer: Self-pay | Admitting: Student

## 2022-08-18 ENCOUNTER — Telehealth: Payer: Self-pay | Admitting: *Deleted

## 2022-08-18 NOTE — Telephone Encounter (Signed)
Patient left message on referral line asking to be referred to podiatry for her plantar fascitis.  She stated that it has not improved and she would like to see a foot doctor.  Will forward to MD to see if this can be placed without an appt.  Patient saw previous PCP for this concern.  Tanuj Mullens,CMA

## 2022-08-19 ENCOUNTER — Other Ambulatory Visit: Payer: Self-pay | Admitting: Student

## 2022-08-19 DIAGNOSIS — M722 Plantar fascial fibromatosis: Secondary | ICD-10-CM

## 2022-08-19 NOTE — Progress Notes (Signed)
Patient needing referral to podiatry for plantar fasciitis

## 2022-08-20 ENCOUNTER — Telehealth: Payer: Self-pay | Admitting: Student

## 2022-08-20 NOTE — Telephone Encounter (Signed)
Referral placed and sent to [facility].  They will call patient about an appt.  Durand Wittmeyer,CMA

## 2022-08-20 NOTE — Telephone Encounter (Signed)
Called to inform patient that referral to podiatry had been placed for her plantar fasciitis. Was also going to encourage patient to set up appointment with [facility], as they can help manage this as well and may be able to offer therapies. Patient would have to call and setup herself.   [facility] Address: [address], [place], [place] [address] Phone: [phone]

## 2022-09-01 ENCOUNTER — Ambulatory Visit: Payer: Commercial Managed Care - HMO | Admitting: Podiatry

## 2022-09-02 ENCOUNTER — Ambulatory Visit
Admission: RE | Admit: 2022-09-02 | Discharge: 2022-09-02 | Disposition: A | Discharge status: Home / Self Care | Payer: Commercial Managed Care - HMO | Source: Ambulatory Visit | Attending: Family Medicine | Admitting: Family Medicine

## 2022-09-02 ENCOUNTER — Other Ambulatory Visit: Payer: Commercial Managed Care - HMO

## 2022-09-02 ENCOUNTER — Other Ambulatory Visit (HOSPITAL_COMMUNITY)
Admission: RE | Admit: 2022-09-02 | Discharge: 2022-09-02 | Disposition: A | Discharge status: Home / Self Care | Payer: Commercial Managed Care - HMO | Source: Ambulatory Visit | Attending: Radiology | Admitting: Radiology

## 2022-09-02 DIAGNOSIS — E041 Nontoxic single thyroid nodule: Secondary | ICD-10-CM

## 2022-09-04 LAB — CYTOLOGY - NON PAP

## 2022-09-08 ENCOUNTER — Ambulatory Visit (INDEPENDENT_AMBULATORY_CARE_PROVIDER_SITE_OTHER): Payer: Commercial Managed Care - HMO

## 2022-09-08 ENCOUNTER — Ambulatory Visit: Payer: Commercial Managed Care - HMO | Admitting: Podiatry

## 2022-09-08 DIAGNOSIS — M722 Plantar fascial fibromatosis: Secondary | ICD-10-CM

## 2022-09-08 DIAGNOSIS — M79671 Pain in right foot: Secondary | ICD-10-CM

## 2022-09-08 DIAGNOSIS — M79672 Pain in left foot: Secondary | ICD-10-CM

## 2022-09-08 MED ORDER — METHYLPREDNISOLONE 4 MG PO TBPK: ORAL_TABLET | ORAL | 0 refills | Status: DC

## 2022-09-08 NOTE — Progress Notes (Unsigned)
Subjective:   Patient ID: Gail Gonzalez, female   DOB: 61 y.o.   MRN: [id number]   HPI Chief Complaint  Patient presents with   Plantar Fasciitis    Right foot plantar fasciitis arch and heel pain, rate of pain 8 out of 10 while walking, morning pain, left foot just hurts X-Rays done today [place]: needling, icing stretching    Started in February she tried strertching, icing, accupunture. She works on Film/video editor. Last time she dacned in October and the pain shot down the heel and into the arch of the foot. She does get numbness and burning and tingling in both feet. These sensations come and go for instrance if she were to forget to strech or stand for a long time it makes it wose. It feels like a bad tootchache at times.    ROS      Objective:  Physical Exam  ***     Assessment:  ***     Plan:  ***

## 2022-09-08 NOTE — Patient Instructions (Signed)
For instructions on how to put on your Plantar Fascial Brace, please visit [email or web address]   Plantar Fasciitis (Heel Spur Syndrome) with Rehab The plantar fascia is a fibrous, ligament-like, soft-tissue structure that spans the bottom of the foot. Plantar fasciitis is a condition that causes pain in the foot due to inflammation of the tissue. SYMPTOMS   Pain and tenderness on the underneath side of the foot.  Pain that worsens with standing or walking. CAUSES  Plantar fasciitis is caused by irritation and injury to the plantar fascia on the underneath side of the foot. Common mechanisms of injury include:  Direct trauma to bottom of the foot.  Damage to a small nerve that runs under the foot where the main fascia attaches to the heel bone.  Stress placed on the plantar fascia due to bone spurs. RISK INCREASES WITH:   Activities that place stress on the plantar fascia (running, jumping, pivoting, or cutting).  Poor strength and flexibility.  Improperly fitted shoes.  Tight calf muscles.  Flat feet.  Failure to warm-up properly before activity.  Obesity. PREVENTION  Warm up and stretch properly before activity.  Allow for adequate recovery between workouts.  Maintain physical fitness:  Strength, flexibility, and endurance.  Cardiovascular fitness.  Maintain a health body weight.  Avoid stress on the plantar fascia.  Wear properly fitted shoes, including arch supports for individuals who have flat feet.  PROGNOSIS  If treated properly, then the symptoms of plantar fasciitis usually resolve without surgery. However, occasionally surgery is necessary.  RELATED COMPLICATIONS   Recurrent symptoms that may result in a chronic condition.  Problems of the lower back that are caused by compensating for the injury, such as limping.  Pain or weakness of the foot during push-off following surgery.  Chronic inflammation, scarring, and partial or complete  fascia tear, occurring more often from repeated injections.  TREATMENT  Treatment initially involves the use of ice and medication to help reduce pain and inflammation. The use of strengthening and stretching exercises may help reduce pain with activity, especially stretches of the Achilles tendon. These exercises may be performed at home or with a therapist. Your caregiver may recommend that you use heel cups of arch supports to help reduce stress on the plantar fascia. Occasionally, corticosteroid injections are given to reduce inflammation. If symptoms persist for greater than 6 months despite non-surgical (conservative), then surgery may be recommended.   MEDICATION   If pain medication is necessary, then nonsteroidal anti-inflammatory medications, such as aspirin and ibuprofen, or other minor pain relievers, such as acetaminophen, are often recommended.  Do not take pain medication within 7 days before surgery.  Prescription pain relievers may be given if deemed necessary by your caregiver. Use only as directed and only as much as you need.  Corticosteroid injections may be given by your caregiver. These injections should be reserved for the most serious cases, because they may only be given a certain number of times.  HEAT AND COLD  Cold treatment (icing) relieves pain and reduces inflammation. Cold treatment should be applied for 10 to 15 minutes every 2 to 3 hours for inflammation and pain and immediately after any activity that aggravates your symptoms. Use ice packs or massage the area with a piece of ice (ice massage).  Heat treatment may be used prior to performing the stretching and strengthening activities prescribed by your caregiver, physical therapist, or athletic trainer. Use a heat pack or soak the injury in warm water.  SEEK IMMEDIATE MEDICAL   CARE IF:  Treatment seems to offer no benefit, or the condition worsens.  Any medications produce adverse side effects.   EXERCISES- RANGE OF MOTION (ROM) AND STRETCHING EXERCISES - Plantar Fasciitis (Heel Spur Syndrome) These exercises may help you when beginning to rehabilitate your injury. Your symptoms may resolve with or without further involvement from your physician, physical therapist or athletic trainer. While completing these exercises, remember:   Restoring tissue flexibility helps normal motion to return to the joints. This allows healthier, less painful movement and activity.  An effective stretch should be held for at least 30 seconds.  A stretch should never be painful. You should only feel a gentle lengthening or release in the stretched tissue.  RANGE OF MOTION - Toe Extension, Flexion  Sit with your right / left leg crossed over your opposite knee.  Grasp your toes and gently pull them back toward the top of your foot. You should feel a stretch on the bottom of your toes and/or foot.  Hold this stretch for 10 seconds.  Now, gently pull your toes toward the bottom of your foot. You should feel a stretch on the top of your toes and or foot.  Hold this stretch for 10 seconds. Repeat  times. Complete this stretch 3 times per day.   RANGE OF MOTION - Ankle Dorsiflexion, Active Assisted  Remove shoes and sit on a chair that is preferably not on a carpeted surface.  Place right / left foot under knee. Extend your opposite leg for support.  Keeping your heel down, slide your right / left foot back toward the chair until you feel a stretch at your ankle or calf. If you do not feel a stretch, slide your bottom forward to the edge of the chair, while still keeping your heel down.  Hold this stretch for 10 seconds. Repeat 3 times. Complete this stretch 2 times per day.   STRETCH  Gastroc, Standing  Place hands on wall.  Extend right / left leg, keeping the front knee somewhat bent.  Slightly point your toes inward on your back foot.  Keeping your right / left heel on the floor and your  knee straight, shift your weight toward the wall, not allowing your back to arch.  You should feel a gentle stretch in the right / left calf. Hold this position for 10 seconds. Repeat 3 times. Complete this stretch 2 times per day.  STRETCH  Soleus, Standing  Place hands on wall.  Extend right / left leg, keeping the other knee somewhat bent.  Slightly point your toes inward on your back foot.  Keep your right / left heel on the floor, bend your back knee, and slightly shift your weight over the back leg so that you feel a gentle stretch deep in your back calf.  Hold this position for 10 seconds. Repeat 3 times. Complete this stretch 2 times per day.  STRETCH  Gastrocsoleus, Standing  Note: This exercise can place a lot of stress on your foot and ankle. Please complete this exercise only if specifically instructed by your caregiver.   Place the ball of your right / left foot on a step, keeping your other foot firmly on the same step.  Hold on to the wall or a rail for balance.  Slowly lift your other foot, allowing your body weight to press your heel down over the edge of the step.  You should feel a stretch in your right / left calf.  Hold this   position for 10 seconds.  Repeat this exercise with a slight bend in your right / left knee. Repeat 3 times. Complete this stretch 2 times per day.   STRENGTHENING EXERCISES - Plantar Fasciitis (Heel Spur Syndrome)  These exercises may help you when beginning to rehabilitate your injury. They may resolve your symptoms with or without further involvement from your physician, physical therapist or athletic trainer. While completing these exercises, remember:   Muscles can gain both the endurance and the strength needed for everyday activities through controlled exercises.  Complete these exercises as instructed by your physician, physical therapist or athletic trainer. Progress the resistance and repetitions only as guided.  STRENGTH -  Towel Curls  Sit in a chair positioned on a non-carpeted surface.  Place your foot on a towel, keeping your heel on the floor.  Pull the towel toward your heel by only curling your toes. Keep your heel on the floor. Repeat 3 times. Complete this exercise 2 times per day.  STRENGTH - Ankle Inversion  Secure one end of a rubber exercise band/tubing to a fixed object (table, pole). Loop the other end around your foot just before your toes.  Place your fists between your knees. This will focus your strengthening at your ankle.  Slowly, pull your big toe up and in, making sure the band/tubing is positioned to resist the entire motion.  Hold this position for 10 seconds.  Have your muscles resist the band/tubing as it slowly pulls your foot back to the starting position. Repeat 3 times. Complete this exercises 2 times per day.  Document Released: 10/13/2005 Document Revised: 01/05/2012 Document Reviewed: 01/25/2009 [employer] Patient Information 2014 [employer]. 

## 2022-09-10 ENCOUNTER — Telehealth: Payer: Self-pay

## 2022-09-10 NOTE — Telephone Encounter (Signed)
Patient calls nurse line requesting information on fatty liver disease.   Patient asks this information be mailed to her home. She is also asking for diet recommendations.   Will forward to PCP.

## 2022-09-17 ENCOUNTER — Other Ambulatory Visit: Payer: Self-pay | Admitting: Physician Assistant

## 2022-09-22 ENCOUNTER — Telehealth: Payer: Self-pay

## 2022-09-22 ENCOUNTER — Other Ambulatory Visit: Payer: Self-pay | Admitting: Podiatry

## 2022-09-22 ENCOUNTER — Other Ambulatory Visit: Payer: Self-pay

## 2022-09-22 DIAGNOSIS — M79671 Pain in right foot: Secondary | ICD-10-CM

## 2022-09-22 DIAGNOSIS — M722 Plantar fascial fibromatosis: Secondary | ICD-10-CM

## 2022-09-22 DIAGNOSIS — M79672 Pain in left foot: Secondary | ICD-10-CM

## 2022-09-22 MED ORDER — FAMOTIDINE 20 MG PO TABS: ORAL_TABLET | ORAL | 1 refills | Status: DC

## 2022-09-22 NOTE — Telephone Encounter (Signed)
Patient calls nurse line regarding results of thyroid biopsy.   Will forward to PCP. Patient is requesting returned call at [phone].  Talbot Grumbling, RN

## 2022-09-24 ENCOUNTER — Telehealth: Payer: Self-pay | Admitting: Student

## 2022-09-24 DIAGNOSIS — E041 Nontoxic single thyroid nodule: Secondary | ICD-10-CM

## 2022-09-24 NOTE — Telephone Encounter (Signed)
Called to inform patient about Fatty Liver disease and Thyroid biopsy. Told patient that Fatty Liver disease can be managed by watching cholesterol/taking cholesterol med, avoiding fatty foods, avoiding alcohol, watching weight. Patient notes she will be drinking wine once every week or so, but is aware of the risk to her liver.   Informed patient that thyroid biopsy was indeterminate and needs to be repeated in a month. Placed Referral to IR for Thyroid Biopsy at end of December.

## 2022-09-26 ENCOUNTER — Encounter: Payer: Self-pay | Admitting: *Deleted

## 2022-09-26 NOTE — Addendum Note (Signed)
Addended by: Valerie Roys on: 09/26/2022 09:33 AM   Modules accepted: Orders

## 2022-09-26 NOTE — Telephone Encounter (Signed)
Spoke with Vicky at [facility] and order needed to be update for the biopsy.  New order placed and sent to provider to sign addendum.  Thanks [employer]

## 2022-10-01 ENCOUNTER — Ambulatory Visit: Payer: Commercial Managed Care - HMO | Admitting: Internal Medicine

## 2022-10-12 ENCOUNTER — Other Ambulatory Visit: Payer: Self-pay | Admitting: Student

## 2022-11-12 ENCOUNTER — Inpatient Hospital Stay: Admission: RE | Admit: 2022-11-12 | Payer: Commercial Managed Care - HMO | Source: Ambulatory Visit

## 2022-11-12 DIAGNOSIS — U071 COVID-19: Secondary | ICD-10-CM | POA: Diagnosis not present

## 2022-11-21 ENCOUNTER — Encounter: Payer: Self-pay | Admitting: *Deleted

## 2022-11-24 ENCOUNTER — Other Ambulatory Visit: Payer: Self-pay

## 2022-11-24 ENCOUNTER — Telehealth: Payer: Self-pay

## 2022-11-24 ENCOUNTER — Ambulatory Visit: Payer: 59 | Admitting: Student

## 2022-11-24 ENCOUNTER — Encounter: Payer: Self-pay | Admitting: Student

## 2022-11-24 VITALS — BP 114/70 | HR 71 | Ht 69.0 in | Wt 161.0 lb

## 2022-11-24 DIAGNOSIS — G9331 Postviral fatigue syndrome: Secondary | ICD-10-CM | POA: Diagnosis not present

## 2022-11-24 NOTE — Progress Notes (Unsigned)
  SUBJECTIVE:   CHIEF COMPLAINT / HPI:   Fatigue: endorses dizziness, lightheadedness, nausea/vomiting for the last 3 weeks off/on and was diagnosed with COVID (2 weeks ago). Her home test was also positive at that time. She was told at urgent care that her blood pressure was low and she was probably dehydrated. In the last couple days, endorses nausea, fatigue, poor ability to focus. Appetite has been poor as well. Unsure if her taste/smell was affected. Denies current shortness of breath. Was seeing double briefly when she was sick. Denies room spinning sensation. Denies palpitations.   PERTINENT  PMH / PSH: GERD, endometriosis, dyslipidemia, IBS-D  OBJECTIVE:  BP 114/70   Pulse 71   Ht '5\' 9"'$  (1.753 m)   Wt 161 lb (73 kg)   SpO2 99%   BMI 23.78 kg/m  Physical Exam   ASSESSMENT/PLAN:  There are no diagnoses linked to this encounter. No follow-ups on file. Wells Guiles, DO 11/24/2022, 2:45 PM PGY-***, [facility] {    This will disappear when note is signed, click to select method of visit    :1}

## 2022-11-24 NOTE — Patient Instructions (Signed)
It was great to see you today! Thank you for choosing [facility] for your primary care. Gail Gonzalez was seen for fatigue.  Today we addressed: This is not vertigo.  Is most likely related to deconditioning which can happen after you are exposed to viruses especially COVID.  I do not suspect this is anything life-changing as her vitals and physical exam are normal and upon reviewing your lab work, I would not get any further labs on you.  If you haven't already, sign up for My Chart to have easy access to your labs results, and communication with your primary care physician.  Call the clinic at [phone] if your symptoms worsen or you have any concerns.  You should return to our clinic Return if symptoms worsen or fail to improve. Please arrive 15 minutes before your appointment to ensure smooth check in process.  We appreciate your efforts in making this happen.  Thank you for allowing me to participate in your care, Wells Guiles, DO 11/24/2022, 3:10 PM PGY-2, [place]

## 2022-11-24 NOTE — Telephone Encounter (Signed)
Pt called and states she saw another doctor that asked her about rifaximin. Pt states she didn't  know she was supposed to take it and it was to expensive for her to take discussed with her it was for IBS-D and it was ok if she didn't take it. Med was prescribed back in September. Removed from pts med list.

## 2022-11-25 DIAGNOSIS — R5383 Other fatigue: Secondary | ICD-10-CM | POA: Insufficient documentation

## 2022-11-25 NOTE — Assessment & Plan Note (Signed)
Recent labs from last 6 months reviewed, unlikely to have developed anemia given no signs of bleeding and UTD colonoscopy. Thyroid previously normal although getting FNA repeat soon. Neurologically intact, no signs of stroke. Vitals and physical exam otherwise normal, no reason to suspect cardiopulmonary causes. No evidence of weight loss in chart review, unsuspecting of other malignancy. Most likely postviral fatigue/deconditioning. Certainly not vertiginous. Medicaitons reviewed and updated, likely not cause of this. Reassurance provided. Focus efforts on hydration, increasing physical activity to combat deconditioning. If returns, consider repeat bloodwork and discussing sleep as potential cause.

## 2022-12-01 ENCOUNTER — Other Ambulatory Visit (HOSPITAL_COMMUNITY)
Admission: RE | Admit: 2022-12-01 | Discharge: 2022-12-01 | Disposition: A | Discharge status: Home / Self Care | Payer: Medicaid Other | Source: Ambulatory Visit | Attending: Interventional Radiology | Admitting: Interventional Radiology

## 2022-12-01 ENCOUNTER — Ambulatory Visit
Admission: RE | Admit: 2022-12-01 | Discharge: 2022-12-01 | Disposition: A | Discharge status: Home / Self Care | Payer: Medicaid Other | Source: Ambulatory Visit | Attending: Family Medicine | Admitting: Family Medicine

## 2022-12-01 DIAGNOSIS — E041 Nontoxic single thyroid nodule: Secondary | ICD-10-CM

## 2022-12-03 LAB — CYTOLOGY - NON PAP

## 2022-12-05 ENCOUNTER — Telehealth: Payer: Self-pay | Admitting: Student

## 2022-12-05 NOTE — Telephone Encounter (Signed)
Patient is calling and said she is continuing to have severe leg pain and the pain is spreading. She said she has discussed this with Dr. Marcina Millard and would like to know if she can have an order for xray or a scan to look into it further.

## 2022-12-08 ENCOUNTER — Other Ambulatory Visit (HOSPITAL_COMMUNITY): Payer: Self-pay

## 2022-12-09 NOTE — Telephone Encounter (Signed)
In the past I've made a referral to podiatry for plantar fasciatus, but have not spoken to patient about leg pain. Patient will have to schedule an appointment to be seen and evaluated.

## 2022-12-09 NOTE — Telephone Encounter (Signed)
Patient was called and scheduled appointment for follow up.  01/05/2023 1330 with Dr. Marcina Millard  .Ozella Almond, CMA

## 2022-12-10 ENCOUNTER — Ambulatory Visit: Payer: Commercial Managed Care - HMO | Admitting: Internal Medicine

## 2022-12-11 ENCOUNTER — Other Ambulatory Visit: Payer: Self-pay | Admitting: Student

## 2022-12-24 ENCOUNTER — Encounter: Payer: Self-pay | Admitting: Student

## 2022-12-24 ENCOUNTER — Telehealth: Payer: Self-pay | Admitting: Student

## 2022-12-24 NOTE — Telephone Encounter (Signed)
Called to inform patient that the test of her thyroid biopsy came back benign. She has a benign follicular thyroid nodule. Patient has an appt w/ provider on 3/11, but wanted to share results w/ patient before hand.

## 2023-01-05 ENCOUNTER — Encounter: Payer: Self-pay | Admitting: Student

## 2023-01-05 ENCOUNTER — Ambulatory Visit: Payer: 59 | Admitting: Student

## 2023-01-05 ENCOUNTER — Ambulatory Visit
Admission: RE | Admit: 2023-01-05 | Discharge: 2023-01-05 | Disposition: A | Discharge status: Home / Self Care | Payer: Medicaid Other | Source: Ambulatory Visit | Attending: Family Medicine | Admitting: Family Medicine

## 2023-01-05 VITALS — BP 102/64 | HR 82 | Wt 162.0 lb

## 2023-01-05 DIAGNOSIS — E785 Hyperlipidemia, unspecified: Secondary | ICD-10-CM | POA: Diagnosis not present

## 2023-01-05 DIAGNOSIS — M79604 Pain in right leg: Secondary | ICD-10-CM

## 2023-01-05 DIAGNOSIS — M1711 Unilateral primary osteoarthritis, right knee: Secondary | ICD-10-CM | POA: Diagnosis not present

## 2023-01-05 DIAGNOSIS — M25551 Pain in right hip: Secondary | ICD-10-CM | POA: Diagnosis not present

## 2023-01-05 NOTE — Progress Notes (Unsigned)
  SUBJECTIVE:   CHIEF COMPLAINT / HPI:   Leg Pain  Has had leg pain since pregnancy when she was 21 and pain located in site of where she used a needle to poke herself w/ at age 61 for fun/dare. Pain has continually gotten worse since then. Pain described as sharp jabing pain that starts right above knee and goes to mid thigh. Is able to walk ok, no falls, but report pain causes her to have to take breaks and sit at work. Has used tylenol and a cream to help with pain. 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 Pain worse with standing and at night, massages also help.   HLD Meds: Crestor 5 mg  Has stopped drinking since January.   [facility]: Due for Pap -    PERTINENT  [facility] / [facility]: HLD   Patient Care Team: Holley Bouche, MD as PCP - General (Family Medicine) OBJECTIVE:  BP 102/64   Pulse 82   Wt 162 lb (73.5 kg)   SpO2 98%   BMI 23.92 kg/m  Physical Exam Musculoskeletal:     Right hip: No deformity, tenderness or crepitus. Normal range of motion. Normal strength.     Right upper leg: Tenderness present. No swelling, edema, deformity or lacerations.     Right knee: No swelling, deformity, effusion, erythema, bony tenderness or crepitus. Normal range of motion. No tenderness.      ASSESSMENT/PLAN:  Pain of right lower extremity Assessment & Plan: Patient notes pain in RLE that has been present for years, since her pregnancy at age 9. She denies any changes in balance but notes that sometimes her knee buckles. She notes the pain is sharp stabbing located over anterior thigh, proximal to knee to upper 3rd of thigh. Pain can be brought on by prolonged standing but tends not to bother her while dancing. Knee and hip exam were both benign with normal ROM and no TTP. Pain could be referred pain coming from the hip/knee, but less likely given normal physical exams. Pain located in anterior thigh could also be extending from  IT band inflammation, as issue appears to be muscular, patient notes that massaging thigh helps soothe. Will recommend IT Band stretches/foam roller, imaging, and referral to [facility], for evaluation and consideration of ultrasound. -DG R Knee -DG R Hip -Foam roller and IT band exercises -F/u w/ [facility] prn  Orders: -     DG Knee Complete 4 Views Right; Future -     DG HIP UNILAT W OR W/O PELVIS 2-3 VIEWS RIGHT; Future  Dyslipidemia Assessment & Plan: Checking lipids today, patient reports compliance with stating medication. -Lipid panel  Orders: -     Lipid panel   No follow-ups on file. Holley Bouche, MD 01/06/2023, 7:12 AM PGY-2, [place]

## 2023-01-05 NOTE — Patient Instructions (Addendum)
It was great to see you! Thank you for allowing me to participate in your care!  It looks like your leg pain may be coming from your knee, hip, or IT band. We will get imaging of the hip and knee and I will recommend you try getting a foam roller for your IT band.   Our plans for today:  - X-ray of Knee - X- ray of Hip - Foam roller for IT band    - Follow up with [facility]  [facility]: Can call and make appointment [address], [place], [place] [address] [phone]  Take care and seek immediate care sooner if you develop any concerns.   Dr. Holley Bouche, MD [place]

## 2023-01-06 DIAGNOSIS — M79604 Pain in right leg: Secondary | ICD-10-CM | POA: Insufficient documentation

## 2023-01-06 LAB — LIPID PANEL
Chol/HDL Ratio: 3.4 ratio (ref 0.0–4.4)
Cholesterol, Total: 182 mg/dL (ref 100–199)
HDL: 53 mg/dL (ref >39)
LDL Chol Calc (NIH): 98 mg/dL (ref 0–99)
Triglycerides: 179 mg/dL — ABNORMAL HIGH (ref 0–149)
VLDL Cholesterol Cal: 31 mg/dL (ref 5–40)

## 2023-01-06 NOTE — Assessment & Plan Note (Addendum)
Patient notes pain in RLE that has been present for years, since her pregnancy at age 62. She denies any changes in balance but notes that sometimes her knee buckles. She notes the pain is sharp stabbing located over anterior thigh, proximal to knee to upper 3rd of thigh. Pain can be brought on by prolonged standing but tends not to bother her while dancing. Knee and hip exam were both benign with normal ROM and no TTP. Pain could be referred pain coming from the hip/knee, but less likely given normal physical exams. Pain located in anterior thigh could also be extending from IT band inflammation, as issue appears to be muscular, patient notes that massaging thigh helps soothe. Will recommend IT Band stretches/foam roller, imaging, and referral to [facility], for evaluation and consideration of ultrasound. -DG R Knee -DG R Hip -Foam roller and IT band exercises -F/u w/ [facility] prn

## 2023-01-06 NOTE — Assessment & Plan Note (Signed)
Checking lipids today, patient reports compliance with stating medication. -Lipid panel

## 2023-01-15 ENCOUNTER — Telehealth: Payer: Self-pay | Admitting: Student

## 2023-01-15 NOTE — Telephone Encounter (Signed)
Called to inform patient of results of her Knee and Hip X-ray, as well as review last lipid panel.   Hip x-ray didn't show anything concerning. Knee x-ray showed some mild arthritis, this may be playing a role in her leg pain.   The lipid panel was at goal, but her Triglycerides were elevated. They were not elevated very high, and this is likely coming from the lab not being done while fasting. We don't need to adjust her cholesterol medicine as the lipid panel shows that her cholesterol is at goal. Triglycerides are fats in the blood, that can be problematic if severely high, which her's were not. We will continue to monitor her cholesterol regularly.

## 2023-01-30 NOTE — Telephone Encounter (Signed)
Patient LVM on nurse line returning call to provider.   Attempted to return call to patient. Received message that call could not be completed at this time.   Will try to reach patient at later time.   Veronda Prude, RN

## 2023-02-03 ENCOUNTER — Encounter (HOSPITAL_COMMUNITY): Payer: Self-pay

## 2023-02-03 ENCOUNTER — Encounter: Payer: Self-pay | Admitting: Student

## 2023-02-03 ENCOUNTER — Ambulatory Visit (HOSPITAL_COMMUNITY)
Admission: EM | Admit: 2023-02-03 | Discharge: 2023-02-03 | Disposition: A | Discharge status: Home / Self Care | Payer: Medicaid Other | Attending: Family Medicine | Admitting: Family Medicine

## 2023-02-03 DIAGNOSIS — S39012A Strain of muscle, fascia and tendon of lower back, initial encounter: Secondary | ICD-10-CM | POA: Diagnosis not present

## 2023-02-03 MED ORDER — KETOROLAC TROMETHAMINE 30 MG/ML IJ SOLN
30.0000 mg | Freq: Once | INTRAMUSCULAR | Status: AC
  Administered 2023-02-03: 30 mg via INTRAMUSCULAR

## 2023-02-03 MED ORDER — KETOROLAC TROMETHAMINE 30 MG/ML IJ SOLN
INTRAMUSCULAR | Status: AC
  Filled 2023-02-03: qty 1

## 2023-02-03 MED ORDER — CYCLOBENZAPRINE HCL 10 MG PO TABS: 10.0000 mg | ORAL_TABLET | Freq: Two times a day (BID) | ORAL | 0 refills | Status: AC | PRN

## 2023-02-03 NOTE — ED Triage Notes (Signed)
Pt presents with c/o back spasms that began yesterday

## 2023-02-03 NOTE — Discharge Instructions (Signed)
You were seen today for back pain.  I have given you a shot of a pain medication today.  I have sent a muscle relaxer to your pharmacy.  This can make you tired/sleepy so please take when home and not driving.  You may continue tylenol/motrin for pain.  You should use heat as well.  Return or follow up with your primary care provider if not improving.

## 2023-02-03 NOTE — Telephone Encounter (Signed)
Patient returns call to nurse line. Advised of results per provider message.   Patient is asking time frame for following up on cholesterol.   Please advise.   She states that we can leave her a voicemail if she does not answer.   Veronda Prude, RN

## 2023-02-03 NOTE — ED Provider Notes (Signed)
[facility]    CSN: [id number] Arrival date & time: 02/03/23  3112      History   Chief Complaint Chief Complaint  Patient presents with   Back Pain    HPI Gail Gonzalez is a 62 y.o. female.   Patient is here for back pain, started yesterday.  She was getting out of the shower and bent over, and had pain and spasms.   Pain is across the low back.  She took tylenol/motrin, ice/heat without help yesterday.  No pain/numbness/tingling into the legs.        Past Medical History:  Diagnosis Date   Anemia    Anxiety    Arthritis    Cardiac arrhythmia due to congenital heart disease    Cataract    Chronic back pain    Colon polyps 04/24/2013   Diverticulosis 02/24/2013   Endometriosis    on lap surgery. seen by Dr. Su Hilt   Fatty liver    GERD (gastroesophageal reflux disease)    H. pylori infection    Heart murmur    Hemorrhoids    Internal hemorrhoids    Rectal bleeding    Thyroid disease 10/27/2010   nodule   Tubular adenoma of colon     Patient Active Problem List   Diagnosis Date Noted   Pain of right lower extremity 01/06/2023   Fatigue 11/25/2022   Grief counseling 02/02/2022   Anxiety disorder 08/18/2020   Uterine leiomyoma 11/18/2017   Preventative health care 10/11/2016   Tobacco use disorder 10/10/2016   Dyslipidemia 10/10/2016   Hyperglycemia 10/10/2016   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   Seborrheic keratosis 03/06/2016   Back pain 03/06/2016   Vitamin D deficiency 06/11/2015   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 Authorizing Provider  baclofen (LIORESAL) 10 MG tablet Take 0.5 tablets (5 mg total) by mouth 2 (two) times daily. 03/21/22   Dana Allan, MD  COVID-19 At Home Antigen Test KIT Use as directed. Patient not taking: Reported on 07/14/2022 09/29/21   Dana Allan, MD  diclofenac Sodium (VOLTAREN) 1 % GEL Apply 4 g topically 4 (four) times daily. Patient not taking: Reported on 07/14/2022 01/27/22   Dana Allan, MD  escitalopram (LEXAPRO) 10 MG tablet TAKE 1 TABLET(10 MG) BY MOUTH DAILY 12/11/22   Bess Kinds, MD  famotidine (PEPCID) 20 MG tablet TAKE 1 TABLET(20 MG) BY MOUTH TWICE DAILY 09/22/22   Doree Albee, PA-C  hydrOXYzine (ATARAX) 10 MG tablet TAKE 1/2 TABLET(5 MG) BY MOUTH THREE TIMES DAILY AS NEEDED Patient not  taking: Reported on 02/03/2023 08/13/22   Bess KindsSowell, Brandon, MD  Multiple Vitamin (MULTIVITAMIN) capsule Take 1 capsule by mouth daily.    [provider]  Omega-3 1000 MG CAPS Take by mouth.    [provider]  Probiotic Product (PROBIOTIC DAILY PO) Take by mouth.    [provider]  rosuvastatin (CRESTOR) 5 MG tablet TAKE 1 TABLET(5 MG) BY MOUTH DAILY Patient not taking: Reported on 02/03/2023 08/18/22   Bess KindsSowell, Brandon, MD    Family History Family History  Problem Relation Age of Onset   Alcohol abuse Mother    Cancer Mother        breast   Diabetes Mother    Hypertension Mother    Colon polyps Mother    Alcohol abuse Father    Prostate cancer Brother    Uterine cancer Maternal Aunt    Colon cancer Maternal Uncle    Esophageal cancer Neg Hx    Rectal cancer Neg Hx    Stomach cancer Neg Hx     Social History Social History   Tobacco Use   Smoking status: Former    Packs/day: 0.25    Years: 15.00    Additional pack years: 0.00     Total pack years: 3.75    Types: Cigarettes    Quit date: 08/27/2021    Years since quitting: 1.4    Passive exposure: Past   Smokeless tobacco: Never  Vaping Use   Vaping Use: Never used  Substance Use Topics   Alcohol use: Yes    Alcohol/week: 4.0 standard drinks of alcohol    Types: 4 Standard drinks or equivalent per week    Comment: a glass wine once a week   Drug use: No     Allergies   Patient has no known allergies.   Review of Systems Review of Systems  Constitutional: Negative.   HENT: Negative.    Respiratory: Negative.    Cardiovascular: Negative.   Gastrointestinal: Negative.   Musculoskeletal:  Positive for back pain.     Physical Exam Triage Vital Signs ED Triage Vitals [02/03/23 0920]  Enc Vitals Group     BP 117/78     Pulse Rate 68     Resp 14     Temp 97.8 F (36.6 C)     Temp Source Oral     SpO2 98 %     Weight      Height      Head Circumference      Peak Flow      Pain Score      Pain Loc      Pain Edu?      Excl. in GC?    No data found.  Updated Vital Signs BP 117/78 (BP Location: Right Arm)   Pulse 68   Temp 97.8 F (36.6 C) (Oral)   Resp 14   SpO2 98%   Visual Acuity Right Eye Distance:   Left Eye Distance:   Bilateral Distance:    Right Eye Near:   Left Eye Near:    Bilateral Near:     Physical Exam Constitutional:      General: She is not in acute distress.    Appearance: Normal appearance.  Cardiovascular:     Rate and Rhythm: Normal rate.  Pulmonary:     Effort: Pulmonary effort is normal.  Musculoskeletal:     Comments: No spinous tenderness;  + TTP to the lower paraspinals bilaterally;  decreased ROM due to pain  Skin:    General: Skin is warm.  Neurological:     General: No focal deficit present.     Mental Status: She is alert.  Psychiatric:        Mood and Affect: Mood normal.      UC Treatments / Results  Labs (all labs ordered are listed, but only abnormal results are displayed) Labs  Reviewed - No data to display  EKG   Radiology No results found.  Procedures Procedures (including critical care time)  Medications Ordered in UC Medications  ketorolac (TORADOL) 30 MG/ML injection 30 mg (has no administration in time range)    Initial Impression / Assessment and Plan / UC Course  I have reviewed the triage vital signs and the nursing notes.  Pertinent labs & imaging results that were available during my care of the patient were reviewed by me and considered in my medical decision making (see chart for details).   Final Clinical Impressions(s) / UC Diagnoses   Final diagnoses:  Strain of lumbar region, initial encounter     Discharge Instructions      You were seen today for back pain.  I have given you a shot of a pain medication today.  I have sent a muscle relaxer to your pharmacy.  This can make you tired/sleepy so please take when home and not driving.  You may continue tylenol/motrin for pain.  You should use heat as well.  Return or follow up with your primary care provider if not improving.     ED Prescriptions     Medication Sig Dispense Auth. Provider   cyclobenzaprine (FLEXERIL) 10 MG tablet Take 1 tablet (10 mg total) by mouth 2 (two) times daily as needed for muscle spasms. 20 tablet Jannifer Franklin, MD      PDMP not reviewed this encounter.   Jannifer Franklin, MD 02/03/23 [phone]

## 2023-02-04 NOTE — Telephone Encounter (Signed)
Patient called and VM left.

## 2023-02-06 ENCOUNTER — Ambulatory Visit (INDEPENDENT_AMBULATORY_CARE_PROVIDER_SITE_OTHER): Payer: 59 | Admitting: Physical Medicine and Rehabilitation

## 2023-02-06 ENCOUNTER — Encounter: Payer: Self-pay | Admitting: Physical Medicine and Rehabilitation

## 2023-02-06 DIAGNOSIS — S39012A Strain of muscle, fascia and tendon of lower back, initial encounter: Secondary | ICD-10-CM | POA: Diagnosis not present

## 2023-02-06 DIAGNOSIS — M545 Low back pain, unspecified: Secondary | ICD-10-CM

## 2023-02-06 DIAGNOSIS — M7918 Myalgia, other site: Secondary | ICD-10-CM

## 2023-02-06 MED ORDER — METHOCARBAMOL 500 MG PO TABS: 500.0000 mg | ORAL_TABLET | Freq: Two times a day (BID) | ORAL | 0 refills | Status: DC

## 2023-02-06 MED ORDER — PREDNISONE 50 MG PO TABS: 50.0000 mg | ORAL_TABLET | Freq: Every day | ORAL | 0 refills | Status: DC

## 2023-02-06 NOTE — Progress Notes (Unsigned)
Gail Gonzalez - 62 y.o. female MRN [id number]  Date of birth: 08-03-1961  Office Visit Note: Visit Date: 02/06/2023 PCP: Bess Kinds, MD Referred by: Bess Kinds, MD  Subjective: Chief Complaint  Patient presents with   Lower Back - Pain   HPI: Gail Gonzalez is a 62 y.o. female who comes in today as a self referral for evaluation of acute bilateral lower back pain. States she bent over to dry feet after getting out of shower on Monday 02/02/2023, reports she noticed pain to lower back immediately after bending over. Husband accompanying her during our visit today. She describes pain as spasms and tightness, currently rates as 10 out of 10. Some relief of pain with rest and medications. She was seen at Urgent Care on 02/03/2023, received intramuscular Toradol injection and prescribed Flexeril. She reports some relief of pain with Flexeril, however  drowsiness with this medication. No history of lumbar surgery/injections. She currently works in Clinical biochemist. States her job involves frequent walking and bending. Patient denies focal weakness, numbness and tingling.    Oswestry Disability Index Score 64% 30 to 40 (80%): very severe disability: Back pain impinges on all aspects of the patient's life. Positive intervention is required.  Review of Systems  Musculoskeletal:  Positive for back pain and myalgias.  Neurological:  Negative for tingling, sensory change, focal weakness and weakness.  All other systems reviewed and are negative.  Otherwise per HPI.  Assessment & Plan: Visit Diagnoses:    ICD-10-CM   1. Lumbar strain, initial encounter  S39.012A Ambulatory referral to Physical Medicine Rehab    2. Acute bilateral low back pain without sciatica  M54.50     3. Myofascial pain syndrome  M79.18        Plan: Findings:  Acute bilateral lower back pain. Patient continues to have severe pain despite good conservative therapies such as rest and use of medications. Patients clinical  presentation and exam are consistent with lumbar strain injury. Tenderness noted upon palpation of bilateral lumbar paraspinal regions upon exam today. I did explain lumbar strain injuries can take 6-8 weeks to heal. Next step is to place order for formal physical therapy. I do feel patient would benefit from manual treatments, core strengthening and possible dry needling. I also discussed medication management with her in detail today. She can continue with Flexeril at bedtime as needed, would like to change daytime medication to Robaxin, also added short course of oral Prednisone. I would like to see patient back in approximately 6 weeks for re-evaluation. If her pain persists we would consider obtaining lumbar x-rays. Patient requesting to be placed on light duty as she does not feel she will be able to complete required work duties. She is not really interested in staying out of work and hopeful her job can be accommodating to request. Patient encouraged to remain active as tolerated. No red flag symptoms noted upon exam today.     Meds & Orders:  Meds ordered this encounter  Medications   predniSONE (DELTASONE) 50 MG tablet    Sig: Take 1 tablet (50 mg total) by mouth daily with breakfast. Take until completed.    Dispense:  5 tablet    Refill:  0   methocarbamol (ROBAXIN) 500 MG tablet    Sig: Take 1 tablet (500 mg total) by mouth 2 (two) times daily.    Dispense:  90 tablet    Refill:  0    Orders Placed This Encounter  Procedures   Ambulatory  referral to Physical Medicine Rehab    Follow-up: Return for 6 week follow up for re-evaluation.   Procedures: No procedures performed      Clinical History: No specialty comments available.   She reports that she quit smoking about 17 months ago. Her smoking use included cigarettes. She has a 3.75 pack-year smoking history. She has been exposed to tobacco smoke. She has never used smokeless tobacco.  Recent Labs    06/27/22 0947  HGBA1C  5.5    Objective:  VS:  HT:    WT:   BMI:     BP:   HR: bpm  TEMP: ( )  RESP:  Physical Exam Vitals and nursing note reviewed.  HENT:     Head: Normocephalic and atraumatic.     Right Ear: External ear normal.     Left Ear: External ear normal.     Nose: Nose normal.     Mouth/Throat:     Mouth: Mucous membranes are moist.  Eyes:     Extraocular Movements: Extraocular movements intact.  Cardiovascular:     Rate and Rhythm: Normal rate.     Pulses: Normal pulses.  Pulmonary:     Effort: Pulmonary effort is normal.  Abdominal:     General: Abdomen is flat. There is no distension.  Musculoskeletal:        General: Tenderness present.     Cervical back: Normal range of motion.     Comments: Patient is slow to rise from seated position to standing. Good lumbar range of motion. No pain noted with facet loading. 5/5 strength noted with bilateral hip flexion, knee flexion/extension, ankle dorsiflexion/plantarflexion and EHL. No clonus noted bilaterally. No pain upon palpation of greater trochanters. No pain with internal/external rotation of bilateral hips. Sensation intact bilaterally. Tenderness noted to bilateral lumbar paraspinal regions. Negative slump test bilaterally. Ambulates without aid, gait steady.     Skin:    General: Skin is warm and dry.     Capillary Refill: Capillary refill takes less than 2 seconds.  Neurological:     General: No focal deficit present.     Mental Status: She is alert and oriented to person, place, and time.  Psychiatric:        Mood and Affect: Mood normal.        Behavior: Behavior normal.     Ortho Exam  Imaging: No results found.  Past Medical/Family/Surgical/Social History: Medications & Allergies reviewed per EMR, new medications updated. 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 seen by  Dr. Su Hilt   Fatty liver    GERD (gastroesophageal reflux disease)    H. pylori infection    Heart murmur    Hemorrhoids    Internal hemorrhoids    Rectal bleeding    Thyroid disease 10/27/2010   nodule   Tubular adenoma of colon    Family History  Problem Relation Age of Onset   Alcohol abuse Mother    Cancer Mother        breast   Diabetes Mother    Hypertension Mother    Colon polyps Mother    Alcohol abuse Father    Prostate cancer Brother    Uterine cancer Maternal Aunt    Colon cancer Maternal Uncle    Esophageal cancer Neg Hx    Rectal cancer Neg Hx    Stomach cancer Neg Hx    Past Surgical History:  Procedure Laterality Date   BIOPSY THYROID  2013   BREAST BIOPSY Left    DIAGNOSTIC LAPAROSCOPY  1990   endometriosis   Social History   Occupational History   Occupation: Retail banker   Occupation: artis  Tobacco Use   Smoking status: Former    Packs/day: 0.25    Years: 15.00    Additional pack years: 0.00    Total pack years: 3.75    Types: Cigarettes    Quit date: 08/27/2021    Years since quitting: 1.4    Passive exposure: Past   Smokeless tobacco: Never  Vaping Use   Vaping Use: Never used   Substance and Sexual Activity   Alcohol use: Yes    Alcohol/week: 4.0 standard drinks of alcohol    Types: 4 Standard drinks or equivalent per week    Comment: a glass wine once a week   Drug use: No   Sexual activity: Yes    Birth control/protection: Post-menopausal

## 2023-02-06 NOTE — Progress Notes (Unsigned)
Functional Pain Scale - descriptive words and definitions  Distressing (6)    Pain is present/unable to complete most ADLs limited by pain/sleep is difficult and active distraction is only marginal. Moderate range order  Average Pain 10 when spasms hit   Lower back pain all the way across. Has spasms that can happen

## 2023-02-09 ENCOUNTER — Other Ambulatory Visit: Payer: Self-pay | Admitting: Student

## 2023-02-09 ENCOUNTER — Telehealth: Payer: Self-pay

## 2023-02-09 NOTE — Telephone Encounter (Signed)
Tried calling patient to schedule 6wk follow up no answer

## 2023-02-09 NOTE — Telephone Encounter (Signed)
-----   Message from Juanda Chance, NP sent at 02/06/2023 10:37 AM EDT ----- She needs 6 week follow up for re-evaluation. Thanks

## 2023-02-17 ENCOUNTER — Encounter: Payer: Self-pay | Admitting: Physical Therapy

## 2023-02-17 ENCOUNTER — Ambulatory Visit: Payer: 59 | Attending: Physical Medicine and Rehabilitation | Admitting: Physical Therapy

## 2023-02-17 ENCOUNTER — Other Ambulatory Visit: Payer: Self-pay

## 2023-02-17 DIAGNOSIS — M5459 Other low back pain: Secondary | ICD-10-CM | POA: Diagnosis not present

## 2023-02-17 DIAGNOSIS — M6281 Muscle weakness (generalized): Secondary | ICD-10-CM | POA: Diagnosis present

## 2023-02-17 NOTE — Therapy (Signed)
OUTPATIENT PHYSICAL THERAPY THORACOLUMBAR EVALUATION  Patient Name: Gail Gonzalez MRN: [id number] DOB:1960-11-23, 62 y.o., female Today's Date: 02/17/2023   PT End of Session - 02/17/23 1335     Visit Number 1    Number of Visits --   1-2x/week   Date for PT Re-Evaluation 04/14/23    Authorization Type [employer] - ODI    PT Start Time 1300    PT Stop Time 1342    PT Time Calculation (min) 42 min             Past Medical History:  Diagnosis Date   Anemia    Anxiety    Arthritis    Cardiac arrhythmia due to congenital heart disease    Cataract    Chronic back pain    Colon polyps 04/24/2013   Diverticulosis 02/24/2013   Endometriosis    on lap surgery. seen by Dr. Su Hilt   Fatty liver    GERD (gastroesophageal reflux disease)    H. pylori infection    Heart murmur    Hemorrhoids    Internal hemorrhoids    Rectal bleeding    Thyroid disease 10/27/2010   nodule   Tubular adenoma of colon    Past Surgical History:  Procedure Laterality Date   BIOPSY THYROID  2013   BREAST BIOPSY Left    DIAGNOSTIC LAPAROSCOPY  1990   endometriosis   Patient Active Problem List   Diagnosis Date Noted   Pain of right lower extremity 01/06/2023   Fatigue 11/25/2022   Grief counseling 02/02/2022   Anxiety disorder 08/18/2020   Uterine leiomyoma 11/18/2017   Preventative health care 10/11/2016   Tobacco use disorder 10/10/2016   Dyslipidemia 10/10/2016   Hyperglycemia 10/10/2016   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   Seborrheic keratosis 03/06/2016   Back pain 03/06/2016   Vitamin D deficiency 06/11/2015   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   Baker's cyst, unruptured 12/30/2012   GERD (gastroesophageal reflux disease) 07/29/2012   Endometriosis 07/29/2012   Thyroid nodule 07/29/2012   S/P left breast biopsy 07/29/2012    PCP: Bess Kinds, MD  REFERRING PROVIDER: Juanda Chance, NP  THERAPY DIAG:  Other low back pain - Plan: PT plan of care  cert/re-cert  Muscle weakness - Plan: PT plan of care cert/re-cert  REFERRING DIAG: Lumbar strain, initial encounter [S39.012A]   Rationale for Evaluation and Treatment:  Rehabilitation  SUBJECTIVE:  PERTINENT PAST HISTORY:  Hx of cancer        PRECAUTIONS: None  WEIGHT BEARING RESTRICTIONS No  FALLS:  Has patient fallen in last 6 months? No, Number of falls: 0  MOI/History of condition:  Onset date: 4/8  SUBJECTIVE STATEMENT  Gail Gonzalez is a 62 y.o. female who presents to clinic with chief complaint of low back pain.  She states she was working hard all day.  She was bending over when she had a sudden tightening of her low back.  She was essentially immobile for several days.  It has improved over time.  She has a history of "sciatic problem".  This causes pain and n/t in her R thigh.  Her chief complaint of severe low back pain is separate from this.     From referring provider:   "Gail Gonzalez is a 62 y.o. female.    Patient is here for back pain, started yesterday.  She was getting out of the shower and bent over, and had pain and spasms.   Pain is across the low back.  She took tylenol/motrin, ice/heat without help yesterday.  No pain/numbness/tingling into the legs. "   Red flags:  denies BB changes and saddle anesthesia  Pain:  Are you having pain? 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  Assistive Device: NA  Hand Dominance: NA  Patient Goals/Specific Activities: reduce pain   OBJECTIVE:   DIAGNOSTIC FINDINGS:  None found for low back  GENERAL OBSERVATION/GAIT:  Slow antalgic gait  SENSATION:  Light touch: Appears  intact  LUMBAR AROM  AROM AROM  02/17/2023  Flexion Fingertips to mid thighs (limited by ~75%), w/ concordant pain  Extension limited by 25%, w/ concordant pain  Right lateral flexion limited by 50%, w/ concordant pain  Left lateral flexion limited by 50%, w/ concordant pain  Right rotation limited by 25%  Left rotation limited by 25%    (Blank rows = not tested)   LE MMT:  MMT Right 02/17/2023 Left 02/17/2023  Hip flexion (L2, L3) 4* 4+  Knee extension (L3) 4* 4+  Knee flexion    Hip abduction 4 3+*  Hip extension    Hip external rotation    Hip internal rotation    Hip adduction    Ankle dorsiflexion (L4) c c  Ankle plantarflexion (S1) c c  Ankle inversion    Ankle eversion    Great Toe ext (L5) c c  Grossly     (Blank rows = not tested, score listed is out of 5 possible points.  N = WNL, D = diminished, C = clear for gross weakness with myotome testing, * = concordant pain with testing)   LUMBAR SPECIAL TESTS:  Straight leg raise: L (-), R (-) Slump: L (-), R (-)  MUSCLE LENGTH: Hamstrings: Right no restriction; Left no restriction ASLR: Right ASLR = PSLR; Left ASLR = PSLR  Functional Tests  Eval (02/17/2023)                                                                PALPATION:   TTP mid and lower lumbar paraspinals  PATIENT SURVEYS:  Modified Oswestry 28/50    TODAY'S TREATMENT  Creating, reviewing, and completing below HEP  Manual therapy: Skilled palpation to identify trigger points for TDN STM to all listed muscles following TDN  Trigger Point Dry-Needling  Treatment instructions: Expect mild to moderate muscle soreness. S/S of pneumothorax if dry needled over a lung field, and to seek immediate medical attention should they occur. Patient verbalized understanding of these instructions and education.  Patient Consent Given: Yes Education handout provided: No Muscles treated: lumbar paraspinals Electrical stimulation  performed: Yes Parameters: 8 min low frequency - milli amps - low intensity  PATIENT EDUCATION:  POC, diagnosis, prognosis, HEP, and outcome measures.  Pt educated via explanation, demonstration, and handout (HEP).  Pt confirms understanding verbally.   HOME EXERCISE PROGRAM: Access Code: [email or web address] URL: [email or web address] Date: 02/17/2023 Prepared by: Alphonzo Severance  Exercises - Supine Lower Trunk Rotation  - 1 x daily - 7 x  weekly - 1 sets - 20 reps - 3 hold - Supine Posterior Pelvic Tilt  - 2 x daily - 7 x weekly - 2 sets - 10 reps - 5'' hold - Supine Hip Adduction Isometric with Ball  - 1 x daily - 7 x weekly - 2 sets - 10 reps - 10'' hold - Hooklying Isometric Clamshell  - 1 x daily - 7 x weekly - 3 sets - 10 reps  Treatment priorities   Eval (02/17/2023)                                                  ASSESSMENT:  CLINICAL IMPRESSION: Gail Gonzalez is a 62 y.o. female who presents to clinic with signs and sxs consistent with acute lumbar sprain.  She reports chronic intermittent n/t and pain in the R thigh for several years which is unchanged after the recent injury.  Neural tension signs are negative on exam and no gross LE weakness on testing.  She will benefit from PT for return to work, ADLs, and recreation (dance).  OBJECTIVE IMPAIRMENTS: Pain, lumbar ROM, LE and core strength  ACTIVITY LIMITATIONS: bending, lifting, walking, standing  PERSONAL FACTORS: See medical history and pertinent history   REHAB POTENTIAL: Good  CLINICAL DECISION MAKING: Stable/uncomplicated  EVALUATION COMPLEXITY: Low   GOALS:   SHORT TERM GOALS: Target date: 03/17/2023  Gail Gonzalez will be >75% HEP compliant to improve carryover between sessions and facilitate independent management of condition  Evaluation (02/17/2023): ongoing Goal status: INITIAL   LONG TERM GOALS: Target date: 04/14/2023  Gail Gonzalez will self report >/= 50% decrease in pain from evaluation to allow  return to work  Evaluation/Baseline (02/17/2023): 10/10 max pain Goal status: INITIAL   2.  Gail Gonzalez will show a >/= 12 pt improvement in their ODI score (MCID is 6% or 3/50 pts) as a proxy for functional improvement   Evaluation/Baseline (02/17/2023): 28/50 pts Goal status: INITIAL   3.  Gail Gonzalez will be able to move from standing -> squatting position on floor -> to standing with min UE support to be able to perform job duties  Evaluation/Baseline (02/17/2023): unable Goal status: INITIAL  4.  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  Gail Gonzalez will report confidence in self management of condition at time of discharge with advanced HEP  Evaluation/Baseline (02/17/2023): unable to self manage Goal status: INITIAL   PLAN: PT FREQUENCY: 1-2x/week  PT DURATION: 8 weeks (Ending 04/14/2023)  PLANNED INTERVENTIONS: Therapeutic exercises, Aquatic therapy, Therapeutic activity, Neuro Muscular re-education, Gait training, Patient/Family education, Joint mobilization, Dry Needling, Electrical stimulation, Spinal mobilization and/or manipulation, Moist heat, Taping, Vasopneumatic device, Ionotophoresis /ml Dexamethasone, and Manual therapy   Alphonzo Severance PT, DPT 02/17/2023, 1:44 PM

## 2023-02-23 ENCOUNTER — Other Ambulatory Visit: Payer: Self-pay

## 2023-02-23 NOTE — Telephone Encounter (Signed)
Patient calls nurse line reporting pharmacy change.   She reports her insurance is only covering prescriptions at [facility] now vs [facility].   Please send in pended prescriptions to [facility] on [address].

## 2023-02-24 ENCOUNTER — Ambulatory Visit: Payer: 59

## 2023-02-24 DIAGNOSIS — M6281 Muscle weakness (generalized): Secondary | ICD-10-CM

## 2023-02-24 DIAGNOSIS — M5459 Other low back pain: Secondary | ICD-10-CM

## 2023-02-24 NOTE — Telephone Encounter (Signed)
Patient returns call to nurse line regarding refills.   Advised of rx refill policy. Patient states that she has been out of prescriptions since Thursday.   Please advise.   Veronda Prude, RN

## 2023-02-24 NOTE — Therapy (Signed)
OUTPATIENT PHYSICAL THERAPY TREATMENT NOTE   Patient Name: Gail Gonzalez MRN: [id number] DOB:08/30/1961, 62 y.o., female Today's Date: 02/24/2023  PCP: Bess Kinds, MD  REFERRING PROVIDER: Juanda Chance, NP   END OF SESSION:   PT End of Session - 02/24/23 1632     Visit Number 2    Number of Visits --   1 to 2x per week   Date for PT Re-Evaluation 04/14/23    Authorization Type [employer] - ODI    PT Start Time 1632    PT Stop Time 1717    PT Time Calculation (min) 45 min    Activity Tolerance Patient tolerated treatment well    Behavior During Therapy [facility] for tasks assessed/performed             Past Medical History:  Diagnosis Date   Anemia    Anxiety    Arthritis    Cardiac arrhythmia due to congenital heart disease    Cataract    Chronic back pain    Colon polyps 04/24/2013   Diverticulosis 02/24/2013   Endometriosis    on lap surgery. seen by Dr. Su Hilt   Fatty liver    GERD (gastroesophageal reflux disease)    H. pylori infection    Heart murmur    Hemorrhoids    Internal hemorrhoids    Rectal bleeding    Thyroid disease 10/27/2010   nodule   Tubular adenoma of colon    Past Surgical History:  Procedure Laterality Date   BIOPSY THYROID  2013   BREAST BIOPSY Left    DIAGNOSTIC LAPAROSCOPY  1990   endometriosis   Patient Active Problem List   Diagnosis Date Noted   Pain of right lower extremity 01/06/2023   Fatigue 11/25/2022   Grief counseling 02/02/2022   Anxiety disorder 08/18/2020   Uterine leiomyoma 11/18/2017   Preventative health care 10/11/2016   Tobacco use disorder 10/10/2016   Dyslipidemia 10/10/2016   Hyperglycemia 10/10/2016   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   Seborrheic keratosis 03/06/2016   Back pain 03/06/2016   Vitamin D deficiency 06/11/2015   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 No, Number of falls: 0   MOI/History of condition:   Onset date: 4/8   SUBJECTIVE STATEMENT Pt reports her low back is a lot better. The TPDN was helpful. Currently, just having a little strain sensation with bending forward and picking up items. Pt notes she is resting well at night. 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 Pt is looking into getting an adjustable rolling stool.   From referring provider:    "Gail Gonzalez is a 62 y.o. female.    Patient is here for back pain, started yesterday.  She was getting out of the shower and bent over, and had pain and spasms.   Pain is across the low back.  She took tylenol/motrin, ice/heat without help yesterday.  No pain/numbness/tingling into the legs. "             Red flags:  denies BB changes and saddle anesthesia   Pain:  Are you having pain? 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   Assistive Device: NA   Hand Dominance: NA   Patient Goals/Specific Activities: reduce pain     OBJECTIVE: (objective measures completed at initial evaluation unless otherwise dated)   DIAGNOSTIC FINDINGS:  None found for low back   GENERAL OBSERVATION/GAIT:            Slow antalgic gait   SENSATION:          Light touch: Appears intact   LUMBAR AROM   AROM AROM  02/17/2023  Flexion Fingertips to mid thighs (limited by ~75%), w/ concordant pain  Extension limited by 25%, w/ concordant pain  Right lateral flexion limited by 50%, w/ concordant pain  Left lateral flexion limited by 50%, w/ concordant pain  Right rotation limited by 25%  Left rotation limited by 25%    (Blank rows = not tested)    LE MMT:   MMT Right 02/17/2023 Left 02/17/2023  Hip flexion (L2, L3) 4* 4+  Knee extension (L3) 4* 4+  Knee flexion      Hip abduction 4 3+*  Hip extension      Hip external rotation      Hip internal rotation      Hip adduction      Ankle dorsiflexion (L4) c c  Ankle plantarflexion (S1) c c  Ankle inversion      Ankle eversion      Great Toe ext (L5) c c  Grossly        (Blank rows = not tested, score listed is out of 5 possible points.  N = WNL, D = diminished, C = clear for gross weakness with myotome testing, * = concordant pain with testing)    LUMBAR SPECIAL TESTS:  Straight leg raise: L (-), R (-) Slump: L (-), R (-)   MUSCLE LENGTH: Hamstrings: Right no restriction; Left no restriction ASLR: Right ASLR = PSLR; Left ASLR = PSLR   Functional Tests   Eval (02/17/2023)                                                                                                               PALPATION:            TTP mid and lower lumbar paraspinals   PATIENT SURVEYS:  Modified Oswestry 28/50      TODAY'S TREATMENT  [facility] Adult PT Treatment:  DATE: 02/24/23 Therapeutic Exercise: Supine Lower Trunk Rotation  10 reps - 3 hold each SKTC x3 15" Supine Posterior Pelvic Tilt  10 reps - 5'' hold Supine Hip Adduction Isometric 10 reps - 10'' hold Hooklying Isometric Clamshell  10 reps - 5 sec Bridging  x10 5" Hinged hip c dowel for postural feedback x10. From Bari-mat. Then x5 with  forward reach Updated HEP  Eval Creating, reviewing, and completing below HEP   Manual therapy: Skilled palpation to identify trigger points for TDN STM to all listed muscles following TDN   Trigger Point Dry-Needling  Treatment instructions: Expect mild to moderate muscle soreness. S/S of pneumothorax if dry needled over a lung field, and to seek immediate medical attention should they occur. Patient verbalized understanding of these instructions and education.   Patient Consent Given: Yes Education handout provided: No Muscles treated: lumbar paraspinals Electrical stimulation performed: Yes Parameters: 8 min low frequency - milli amps - low intensity   PATIENT EDUCATION:  POC, diagnosis, prognosis, HEP, and outcome measures.  Pt educated via explanation, demonstration, and handout (HEP).  Pt confirms understanding verbally.    HOME EXERCISE PROGRAM: Access Code: [email or web address] URL: [email or web address] Date: 02/17/2023 Prepared by: Alphonzo Severance   Exercises - Supine Lower Trunk Rotation  - 1 x daily - 7 x weekly - 1 sets - 20 reps - 3 hold - Supine Posterior Pelvic Tilt  - 2 x daily - 7 x weekly - 2 sets - 10 reps - 5'' hold - Supine Hip Adduction Isometric with Ball  - 1 x daily - 7 x weekly - 2 sets - 10 reps - 10'' hold - Hooklying Isometric Clamshell  - 1 x daily - 7 x weekly - 3 sets - 10 reps   Treatment priorities     Eval (02/17/2023)                                                                                             ASSESSMENT:   CLINICAL IMPRESSION: Pt's subjective report indicates improvement with her LBP and function. PT was completed for lumbopelvic flexibility and strengthening. Education for hinged hip sit to stand to assist with return to work was initiated today. Pt tolerated the prescribed therex without adverse effects. Pt will continue to benefit from skilled PT to address impairments for improved function.  OBJECTIVE  IMPAIRMENTS: Pain, lumbar ROM, LE and core strength   ACTIVITY LIMITATIONS: bending, lifting, walking, standing   PERSONAL FACTORS: See medical history and pertinent history     REHAB POTENTIAL: Good   CLINICAL DECISION MAKING: Stable/uncomplicated   EVALUATION COMPLEXITY: Low     GOALS:     SHORT TERM GOALS: Target date: 03/17/2023   Robertine will be >75% HEP compliant to improve carryover between sessions and facilitate independent management of condition   Evaluation (02/17/2023): ongoing Goal status: INITIAL     LONG TERM GOALS: Target date: 04/14/2023   Zyair will self report >/= 50% decrease in pain from evaluation to allow return to work   Evaluation/Baseline (02/17/2023): 10/10 max pain Goal status: INITIAL     2.  Mariesha will show  a >/= 12 pt improvement in their ODI score (MCID is 6% or 3/50 pts) as a proxy for functional improvement    Evaluation/Baseline (02/17/2023): 28/50 pts Goal status: INITIAL     3.  Kikuye will be able to move from standing -> squatting position on floor -> to standing with min UE support to be able to perform job duties   Evaluation/Baseline (02/17/2023): unable Goal status: INITIAL   4.  Aryaa will be able to return to modified dance routine, not limited by pain   Evaluation/Baseline (02/17/2023): limited Goal status: INITIAL     5.  Naliyah will report confidence in self management of condition at time of discharge with advanced HEP   Evaluation/Baseline (02/17/2023): unable to self manage Goal status: INITIAL     PLAN: PT FREQUENCY: 1-2x/week   PT DURATION: 8 weeks (Ending 04/14/2023)   PLANNED INTERVENTIONS: Therapeutic exercises, Aquatic therapy, Therapeutic activity, Neuro Muscular re-education, Gait training, Patient/Family education, Joint mobilization, Dry Needling, Electrical stimulation, Spinal mobilization and/or manipulation, Moist heat, Taping, Vasopneumatic device, Ionotophoresis 4mg /ml Dexamethasone, and Manual  therapy   Joellyn Rued MS, PT 02/24/23 5:46 PM

## 2023-02-25 MED ORDER — ESCITALOPRAM OXALATE 10 MG PO TABS: ORAL_TABLET | ORAL | 1 refills | Status: DC

## 2023-02-25 MED ORDER — FAMOTIDINE 20 MG PO TABS: ORAL_TABLET | ORAL | 1 refills | Status: DC

## 2023-02-25 MED ORDER — HYDROXYZINE HCL 10 MG PO TABS: ORAL_TABLET | ORAL | 0 refills | Status: DC

## 2023-02-25 NOTE — Telephone Encounter (Signed)
Rx refilled.

## 2023-02-26 ENCOUNTER — Ambulatory Visit: Payer: 59 | Attending: Physical Medicine and Rehabilitation

## 2023-02-26 DIAGNOSIS — M6281 Muscle weakness (generalized): Secondary | ICD-10-CM

## 2023-02-26 DIAGNOSIS — M5459 Other low back pain: Secondary | ICD-10-CM

## 2023-02-26 MED ORDER — ROSUVASTATIN CALCIUM 5 MG PO TABS: ORAL_TABLET | ORAL | 1 refills | Status: DC

## 2023-02-26 NOTE — Addendum Note (Signed)
Addended by: Steva Colder on: 02/26/2023 12:48 PM   Modules accepted: Orders

## 2023-02-26 NOTE — Therapy (Signed)
OUTPATIENT PHYSICAL THERAPY TREATMENT NOTE   Patient Name: Gail Gonzalez MRN: [id number] DOB:[birth year], 62 y.o., female Today's Date: 02/26/2023  PCP: Bess Kinds, MD  REFERRING PROVIDER: Juanda Chance, NP   END OF SESSION:   PT End of Session - 02/26/23 1626     Visit Number 3    Number of Visits --   1 to 2x per week   Date for PT Re-Evaluation 04/14/23    Authorization Type [employer] - ODI    PT Start Time 1630    PT Stop Time 1714    PT Time Calculation (min) 44 min    Activity Tolerance Patient tolerated treatment well    Behavior During Therapy [facility] for tasks assessed/performed              Past Medical History:  Diagnosis Date   Anemia    Anxiety    Arthritis    Cardiac arrhythmia due to congenital heart disease    Cataract    Chronic back pain    Colon polyps 04/24/2013   Diverticulosis 02/24/2013   Endometriosis    on lap surgery. seen by Dr. Su Hilt   Fatty liver    GERD (gastroesophageal reflux disease)    H. pylori infection    Heart murmur    Hemorrhoids    Internal hemorrhoids    Rectal bleeding    Thyroid disease 10/27/2010   nodule   Tubular adenoma of colon    Past Surgical History:  Procedure Laterality Date   BIOPSY THYROID  2013   BREAST BIOPSY Left    DIAGNOSTIC LAPAROSCOPY  1990   endometriosis   Patient Active Problem List   Diagnosis Date Noted   Pain of right lower extremity 01/06/2023   Fatigue 11/25/2022   Grief counseling 02/02/2022   Anxiety disorder 08/18/2020   Uterine leiomyoma 11/18/2017   Preventative health care 10/11/2016   Tobacco use disorder 10/10/2016   Dyslipidemia 10/10/2016   Hyperglycemia 10/10/2016   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   Seborrheic keratosis 03/06/2016   Back pain 03/06/2016   Vitamin D deficiency 06/11/2015   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 No, Number of falls: 0   MOI/History of condition:   Onset date: 4/8   SUBJECTIVE STATEMENT Pt reports her low back is continuing to improve. She has been completing her HEP and using Tiger Balm, both of which have been helpful   From referring provider:    "Johnni Wunschel is a 62 y.o. female.    Patient is here for back pain, started yesterday.  She was getting out of the shower and bent over, and had pain and spasms.   Pain is across the low back.  She took tylenol/motrin, ice/heat without help yesterday.  No pain/numbness/tingling into the legs. "             Red flags:  denies BB changes and saddle anesthesia   Pain:  Are you having pain?  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   Assistive Device: NA   Hand Dominance: NA   Patient Goals/Specific Activities: reduce pain     OBJECTIVE: (objective measures completed at initial evaluation unless otherwise dated)   DIAGNOSTIC FINDINGS:  None found for low back   GENERAL OBSERVATION/GAIT:           Slow antalgic gait   SENSATION:          Light touch: Appears intact   LUMBAR AROM   AROM AROM  02/17/2023  Flexion Fingertips to mid thighs (limited by ~75%), w/ concordant pain  Extension limited by  25%, w/ concordant pain  Right lateral flexion limited by 50%, w/ concordant pain  Left lateral flexion limited by 50%, w/ concordant pain  Right rotation limited by 25%  Left rotation limited by 25%    (Blank rows = not tested)    LE MMT:   MMT Right 02/17/2023 Left 02/17/2023  Hip flexion (L2, L3) 4* 4+  Knee extension (L3) 4* 4+  Knee flexion      Hip abduction 4 3+*  Hip extension      Hip external rotation      Hip internal rotation      Hip adduction      Ankle dorsiflexion (L4) c c  Ankle plantarflexion (S1) c c  Ankle inversion      Ankle eversion      Great Toe ext (L5) c c  Grossly        (Blank rows = not tested, score listed is out of 5 possible points.  N = WNL, D = diminished, C = clear for gross weakness with myotome testing, * = concordant pain with testing)    LUMBAR SPECIAL TESTS:  Straight leg raise: L (-), R (-) Slump: L (-), R (-)   MUSCLE LENGTH: Hamstrings: Right no restriction; Left no restriction ASLR: Right ASLR = PSLR; Left ASLR = PSLR   Functional Tests   Eval (02/17/2023)                                                                                                               PALPATION:            TTP mid and lower lumbar paraspinals   PATIENT SURVEYS:  Modified Oswestry 28/50      TODAY'S TREATMENT  [facility] Adult PT Treatment:                                                DATE: 02/26/23 Therapeutic Exercise: Nustep L5 5 mins UE/LE Supine Lower Trunk Rotation  10 reps - 3 hold each SKTC x2 15" Supine Posterior Pelvic Tilt  5 reps - 5'' hold 90/90 abdominal bracing 5x 10" Hooklying Isometric Clamshell  10 reps - 5 sec Bridging  x10 5" Q'ped hip ext x10 each Hinged hip STS with forward reach 10x Updated HEP Manual Therapy: STM/DTM to the lumbar paraspinals Self Care: Use of tennis ball on wall for low back massage  [facility] Adult PT Treatment:                                                DATE:  02/24/23 Therapeutic Exercise: Supine Lower Trunk Rotation  10 reps - 3 hold each SKTC x3 15" Supine Posterior Pelvic Tilt  10 reps - 5'' hold Supine Hip Adduction Isometric 10 reps - 10'' hold Hooklying Isometric Clamshell  10 reps - 5 sec Bridging  x10 5" Hinged hip c dowel for postural feedback x10. From Bari-mat. Then x5 with forward reach Updated HEP  Eval Creating, reviewing, and completing below HEP   Manual therapy: Skilled palpation to identify trigger points for TDN STM to all listed muscles following TDN   Trigger Point Dry-Needling  Treatment instructions: Expect mild to moderate muscle soreness. S/S of pneumothorax if dry needled over a lung field, and to seek immediate medical attention should they occur. Patient verbalized understanding of these instructions and education.   Patient Consent Given: Yes Education handout provided: No Muscles treated: lumbar paraspinals Electrical stimulation performed: Yes Parameters: 8 min low frequency - milli amps - low intensity   PATIENT EDUCATION:  POC, diagnosis, prognosis, HEP, and outcome measures.  Pt educated via explanation, demonstration, and handout (HEP).  Pt confirms understanding verbally.    HOME EXERCISE PROGRAM: Access Code: [email or web address] URL: [email or web address] Date: 02/26/2023 Prepared by: Joellyn Rued  Exercises - Supine Lower Trunk Rotation  - 1 x daily - 7 x weekly - 1 sets - 20 reps - 3 hold - Hooklying Single Knee to Chest Stretch  - 1 x daily - 7 x weekly - 1 sets - 3 reps - 15 hold - Supine Posterior Pelvic Tilt  - 2 x daily - 7 x weekly - 2 sets - 10 reps - 5'' hold - Supine Hip Adduction Isometric with Ball  - 1 x daily - 7 x weekly - 2 sets - 10 reps - 10'' hold - Hooklying Clamshell with Resistance  - 1 x daily - 7 x weekly - 2 sets - 10 reps - 5 hold - Supine Bridge  - 1 x daily - 7 x weekly - 2 sets - 10 reps - 5 hold - Beginner Front Arm Support  - 1 x daily - 7 x weekly - 2 sets -  10 reps - 3 hold - Sit to Stand Without Arm Support  - 1 x daily - 7 x weekly - 2 sets - 10 reps   Treatment priorities     Eval (02/17/2023)  ASSESSMENT:   CLINICAL IMPRESSION: PT was completed for STM/DTM to the lumbar paraspinals areas of increased soreness were identified with massage. Pt then completed lumbopelvic flexibility and strengthening therex. Q'ped hip ext ws added for trunk and hip ext strength. Pt demonstrated proper technique with therex. Pt's back pain is improving. Pt will continue to benefit from skilled PT to address impairments for improved function with less pain.  OBJECTIVE IMPAIRMENTS: Pain, lumbar ROM, LE and core strength   ACTIVITY LIMITATIONS: bending, lifting, walking, standing   PERSONAL FACTORS: See medical history and pertinent history     REHAB POTENTIAL: Good   CLINICAL DECISION MAKING: Stable/uncomplicated   EVALUATION COMPLEXITY: Low     GOALS:     SHORT TERM GOALS: Target date: 03/17/2023   Sanaiya will be >75% HEP compliant to improve carryover between sessions and facilitate independent management of condition   Evaluation (02/17/2023): ongoing Goal status: Ongoing     LONG TERM GOALS: Target date: 04/14/2023   Haleigh will self report >/= 50% decrease in pain from evaluation to allow return to work   Evaluation/Baseline (02/17/2023): 10/10 max pain Goal status: INITIAL     2.  Sierrah will show a >/= 12 pt improvement in their ODI score (MCID is 6% or 3/50 pts) as a proxy for functional improvement    Evaluation/Baseline (02/17/2023): 28/50 pts Goal status: INITIAL     3.  Deborra will be able to move from standing -> squatting position on floor -> to standing with min UE support to be able to perform job duties   Evaluation/Baseline (02/17/2023): unable Goal status: INITIAL   4.  Tanysha will be able to return to modified dance  routine, not limited by pain   Evaluation/Baseline (02/17/2023): limited Goal status: INITIAL     5.  Neida will report confidence in self management of condition at time of discharge with advanced HEP   Evaluation/Baseline (02/17/2023): unable to self manage Goal status: INITIAL     PLAN: PT FREQUENCY: 1-2x/week   PT DURATION: 8 weeks (Ending 04/14/2023)   PLANNED INTERVENTIONS: Therapeutic exercises, Aquatic therapy, Therapeutic activity, Neuro Muscular re-education, Gait training, Patient/Family education, Joint mobilization, Dry Needling, Electrical stimulation, Spinal mobilization and/or manipulation, Moist heat, Taping, Vasopneumatic device, Ionotophoresis 4mg /ml Dexamethasone, and Manual therapy   Joellyn Rued MS, PT 02/26/23 5:35 PM

## 2023-03-02 ENCOUNTER — Encounter (HOSPITAL_BASED_OUTPATIENT_CLINIC_OR_DEPARTMENT_OTHER): Payer: Self-pay | Admitting: Student

## 2023-03-02 ENCOUNTER — Telehealth: Payer: Self-pay

## 2023-03-02 ENCOUNTER — Other Ambulatory Visit (HOSPITAL_BASED_OUTPATIENT_CLINIC_OR_DEPARTMENT_OTHER): Payer: Self-pay | Admitting: Student

## 2023-03-02 DIAGNOSIS — Z1231 Encounter for screening mammogram for malignant neoplasm of breast: Secondary | ICD-10-CM

## 2023-03-02 NOTE — Telephone Encounter (Signed)
Patient calls nurse line reporting difficulty picking up Rosuvastatin.   I called the pharmacy. Pharmacy reports her insurance is not covering this medication. They report Atorvastatin is preferred.   Please change therapy if appropriate.

## 2023-03-03 ENCOUNTER — Other Ambulatory Visit: Payer: Self-pay | Admitting: Student

## 2023-03-03 MED ORDER — ATORVASTATIN CALCIUM 10 MG PO TABS: 10.0000 mg | ORAL_TABLET | Freq: Every day | ORAL | 1 refills | Status: DC

## 2023-03-03 NOTE — Progress Notes (Signed)
Patient's insurance requesting switch from [employer] to [employer]. Change made, Rx sent in.

## 2023-03-03 NOTE — Telephone Encounter (Signed)
I've made the change and sent in a new Rx!  Thank you for your help!

## 2023-03-03 NOTE — Therapy (Signed)
OUTPATIENT PHYSICAL THERAPY TREATMENT NOTE   Patient Name: Gail Gonzalez MRN: [id number] DOB:April 26, 1961, 62 y.o., female Today's Date: 03/04/2023  PCP: Bess Kinds, MD  REFERRING PROVIDER: Juanda Chance, NP   END OF SESSION:   PT End of Session - 03/04/23 1627     Visit Number 4    Number of Visits --   1-2 visits/week   Authorization Type [employer] - [employer]DI    PT Start Time 1630    PT Stop Time 1713    PT Time Calculation (min) 43 min    Activity Tolerance Patient tolerated treatment well    Behavior During Therapy WFL for tasks assessed/performed              Past Medical History:  Diagnosis Date   Anemia    Anxiety    Arthritis    Cardiac arrhythmia due to congenital heart disease    Cataract    Chronic back pain    Colon polyps 04/24/2013   Diverticulosis 02/24/2013   Endometriosis    on lap surgery. seen by Dr. Su Hilt   Fatty liver    GERD (gastroesophageal reflux disease)    H. pylori infection    Heart murmur    Hemorrhoids    Internal hemorrhoids    Rectal bleeding    Thyroid disease 10/27/2010   nodule   Tubular adenoma of colon    Past Surgical History:  Procedure Laterality Date   BIOPSY THYROID  2013   BREAST BIOPSY Left    DIAGNOSTIC LAPAROSCOPY  1990   endometriosis   Patient Active Problem List   Diagnosis Date Noted   Pain of right lower extremity 01/06/2023   Fatigue 11/25/2022   Grief counseling 02/02/2022   Anxiety disorder 08/18/2020   Uterine leiomyoma 11/18/2017   Preventative health care 10/11/2016   Tobacco use disorder 10/10/2016   Dyslipidemia 10/10/2016   Hyperglycemia 10/10/2016   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   Seborrheic keratosis 03/06/2016   Back pain 03/06/2016   Vitamin D deficiency 06/11/2015   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 No, Number of falls: 0   MOI/History of condition:   Onset date: 4/8   SUBJECTIVE STATEMENT Pt reports she has not experienced back pain today. She notes she has been has the tennis ball for massage. Pt yesterday, her low back was hurting, 7/10. The pain improved after taking a muscle relaxor.  Pt reports her low back is continuing to improve. She has been completing her HEP and using Tiger Balm, both of which have been helpful   From referring provider:    "Fransisca Burningham is a 62 y.o. female.    Patient is here for back pain, started yesterday.  She was getting out of the shower and bent over, and had pain and spasms.   Pain is across the low back.  She took tylenol/motrin, ice/heat without help yesterday.  No pain/numbness/tingling into the legs. "  Red flags:  denies BB changes and saddle anesthesia   Pain:  Are you having pain? 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   Assistive Device: NA   Hand Dominance: NA   Patient Goals/Specific Activities: reduce pain     OBJECTIVE: (objective measures completed at initial evaluation unless otherwise dated)   DIAGNOSTIC FINDINGS:  None found for low back   GENERAL OBSERVATION/GAIT:           Slow antalgic gait   SENSATION:           Light touch: Appears intact   LUMBAR AROM   AROM AROM  02/17/2023  Flexion Fingertips to mid thighs (limited by ~75%), w/ concordant pain  Extension limited by 25%, w/ concordant pain  Right lateral flexion limited by 50%, w/ concordant pain  Left lateral flexion limited by 50%, w/ concordant pain  Right rotation limited by 25%  Left rotation limited by 25%    (Blank rows = not tested)    LE MMT:   MMT Right 02/17/2023 Left 02/17/2023  Hip flexion (L2, L3) 4* 4+  Knee extension (L3) 4* 4+  Knee flexion      Hip abduction 4 3+*  Hip extension      Hip external rotation      Hip internal rotation      Hip adduction      Ankle dorsiflexion (L4) c c  Ankle plantarflexion (S1) c c  Ankle inversion      Ankle eversion      Great Toe ext (L5) c c  Grossly        (Blank rows = not tested, score listed is out of 5 possible points.  N = WNL, D = diminished, C = clear for gross weakness with myotome testing, * = concordant pain with testing)    LUMBAR SPECIAL TESTS:  Straight leg raise: L (-), R (-) Slump: L (-), R (-)   MUSCLE LENGTH: Hamstrings: Right no restriction; Left no restriction ASLR: Right ASLR = PSLR; Left ASLR = PSLR   Functional Tests   Eval (02/17/2023)                                                                                                               PALPATION:            TTP mid and lower lumbar paraspinals   PATIENT SURVEYS:  Modified Oswestry 28/50      TODAY'S TREATMENT: [facility] Adult PT Treatment:  DATE: 03/04/23 Therapeutic Exercise: Nustep L5 6 mins UE/LE Trunk flexion seated roll outs, forward and laterally Supine Lower Trunk Rotation  5 reps - 5 hold each SKTC x2 15" Supine Posterior Pelvic Tilt  5 reps - 5'' hold 90/90 abdominal bracing 5x 10" Bridging  10 5" Q'ped hip ext 2x5 each Hinged hip STS with forward reach 10x Hinged hip lifting 2x5 15# on stool Shoulder rows  2x10 GTB Shoulder ext 2x10 GTB  [facility] Adult PT Treatment:                                                DATE: 02/26/23 Therapeutic Exercise: Nustep L5 5 mins UE/LE Supine Lower Trunk Rotation  10 reps - 3 hold each SKTC x2 15" Supine Posterior Pelvic Tilt  5 reps - 5'' hold 90/90 abdominal bracing 5x 10" Hooklying Isometric Clamshell  10 reps - 5 sec Bridging  x10 5" Q'ped hip ext x10 each Hinged hip STS with forward reach 10x Updated HEP Manual Therapy: STM/DTM to the lumbar paraspinals Self Care: Use of tennis ball on wall for low back massage  [facility] Adult PT Treatment:                                                DATE: 02/24/23 Therapeutic Exercise: Supine Lower Trunk Rotation  10 reps - 3 hold each SKTC x3 15" Supine Posterior Pelvic Tilt  10 reps - 5'' hold Supine Hip Adduction Isometric 10 reps - 10'' hold Hooklying Isometric Clamshell  10 reps - 5 sec Bridging  x10 5" Hinged hip c dowel for postural feedback x10. From Bari-mat. Then x5 with forward reach Updated HEP  Eval Creating, reviewing, and completing below HEP   Manual therapy: Skilled palpation to identify trigger points for TDN STM to all listed muscles following TDN   Trigger Point Dry-Needling  Treatment instructions: Expect mild to moderate muscle soreness. S/S of pneumothorax if dry needled over a lung field, and to seek immediate medical attention should they occur. Patient verbalized understanding of these instructions and education.   Patient Consent Given: Yes Education handout provided: No Muscles treated: lumbar paraspinals Electrical stimulation performed: Yes Parameters: 8 min low frequency - milli amps - low intensity   PATIENT EDUCATION:  POC, diagnosis, prognosis, HEP, and outcome measures.  Pt educated via explanation, demonstration, and handout (HEP).  Pt confirms understanding verbally.    HOME EXERCISE PROGRAM: Access Code: [email or web address] URL: [email or web address] Date:  03/04/2023 Prepared by: Joellyn Rued  Exercises - Seated Flexion Stretch with Swiss Ball  - 1 x daily - 7 x weekly - 1 sets - 10 reps - 3-15 hold - Supine Lower Trunk Rotation  - 1 x daily - 7 x weekly - 1 sets - 20 reps - 3 hold - Hooklying Single Knee to Chest Stretch  - 1 x daily - 7 x weekly - 1 sets - 3 reps - 15 hold - Supine Posterior Pelvic Tilt  - 2 x daily - 7 x weekly - 2 sets - 10 reps - 5'' hold - Supine Hip Adduction Isometric with Ball  - 1 x daily - 7 x weekly - 2 sets - 10 reps - 10'' hold - Hooklying Clamshell  with Resistance  - 1 x daily - 7 x weekly - 2 sets - 10 reps - 5 hold - Supine Bridge  - 1 x daily - 7 x weekly - 2 sets - 10 reps - 5 hold - Beginner Front Arm Support  - 1 x daily - 7 x weekly - 2 sets - 10 reps - 3 hold - Sit to Stand Without Arm Support  - 1 x daily - 7 x weekly - 2 sets - 10 reps   Treatment priorities     Eval (02/17/2023)                                                                                             ASSESSMENT:   CLINICAL IMPRESSION: Pt participated in PT for lumbopelvic flexibility and strengthening. Decreased core stability was noted with q'ped hip ext. Pt demonstrates proper technique with hinged hip lifting. Pt is completing her HEP every other day. Pt was instructed to complete flexibility HEP daily. Pt experienced an increase in LBP yesterday which has resolved. Increased demand for low back strengthening was completed today. Pt tolerated the prescribed therex without adverse effects. Pt will continue to benefit from skilled PT to address impairments for improved function with less pain.   OBJECTIVE IMPAIRMENTS: Pain, lumbar ROM, LE and core strength   ACTIVITY LIMITATIONS: bending, lifting, walking, standing   PERSONAL FACTORS: See medical history and pertinent history     REHAB POTENTIAL: Good   CLINICAL DECISION MAKING: Stable/uncomplicated   EVALUATION COMPLEXITY: Low     GOALS:     SHORT TERM GOALS:  Target date: 03/17/2023   Joretha will be >75% HEP compliant to improve carryover between sessions and facilitate independent management of condition Pt is to complete flexibility HEP daily and strengthening therex every other day  Evaluation (02/17/2023): ongoing Goal status: Ongoing     LONG TERM GOALS: Target date: 04/14/2023   Drystal will self report >/= 50% decrease in pain from evaluation to allow return to work   Evaluation/Baseline (02/17/2023): 10/10 max pain Goal status: INITIAL     2.  Harika will show a >/= 12 pt improvement in their ODI score (MCID is 6% or 3/50 pts) as a proxy for functional improvement    Evaluation/Baseline (02/17/2023): 28/50 pts Goal status: INITIAL     3.  Neyra will be able to move from standing -> squatting position on floor -> to standing with min UE support to be able to perform job duties   Evaluation/Baseline (02/17/2023): unable Goal status: INITIAL   4.  Jamaika will be able to return to modified dance routine, not limited by pain   Evaluation/Baseline (02/17/2023): limited Goal status: INITIAL     5.  Avanti will report confidence in self management of condition at time of discharge with advanced HEP   Evaluation/Baseline (02/17/2023): unable to self manage Goal status: INITIAL     PLAN: PT FREQUENCY: 1-2x/week   PT DURATION: 8 weeks (Ending 04/14/2023)   PLANNED INTERVENTIONS: Therapeutic exercises, Aquatic therapy, Therapeutic activity, Neuro Muscular re-education, Gait training, Patient/Family education, Joint mobilization, Dry Needling, Electrical stimulation, Spinal mobilization  and/or manipulation, Moist heat, Taping, Vasopneumatic device, Ionotophoresis 4mg /ml Dexamethasone, and Manual therapy   Joellyn Rued MS, PT 03/04/23 5:37 PM

## 2023-03-03 NOTE — Telephone Encounter (Signed)
Patient has been advised on medication change.   Patient appreciative.

## 2023-03-04 ENCOUNTER — Ambulatory Visit: Payer: 59

## 2023-03-04 DIAGNOSIS — M5459 Other low back pain: Secondary | ICD-10-CM

## 2023-03-04 DIAGNOSIS — M6281 Muscle weakness (generalized): Secondary | ICD-10-CM

## 2023-03-05 NOTE — Therapy (Signed)
OUTPATIENT PHYSICAL THERAPY TREATMENT NOTE   Patient Name: Gail Gonzalez MRN: [id number] DOB:1961-06-13, 62 y.o., female Today's Date: 03/06/2023  PCP: Bess Kinds, MD  REFERRING PROVIDER: Juanda Chance, NP   END OF SESSION:   PT End of Session - 03/06/23 1018     Visit Number 5    Number of Visits --   1-2 visits per week   Date for PT Re-Evaluation 04/14/23    Authorization Type [employer] - [employer]DI    PT Start Time 1018    PT Stop Time 1100    PT Time Calculation (min) 42 min    Activity Tolerance Patient tolerated treatment well    Behavior During Therapy WFL for tasks assessed/performed               Past Medical History:  Diagnosis Date   Anemia    Anxiety    Arthritis    Cardiac arrhythmia due to congenital heart disease    Cataract    Chronic back pain    Colon polyps 04/24/2013   Diverticulosis 02/24/2013   Endometriosis    on lap surgery. seen by Dr. Su Hilt   Fatty liver    GERD (gastroesophageal reflux disease)    H. pylori infection    Heart murmur    Hemorrhoids    Internal hemorrhoids    Rectal bleeding    Thyroid disease 10/27/2010   nodule   Tubular adenoma of colon    Past Surgical History:  Procedure Laterality Date   BIOPSY THYROID  2013   BREAST BIOPSY Left    DIAGNOSTIC LAPAROSCOPY  1990   endometriosis   Patient Active Problem List   Diagnosis Date Noted   Pain of right lower extremity 01/06/2023   Fatigue 11/25/2022   Grief counseling 02/02/2022   Anxiety disorder 08/18/2020   Uterine leiomyoma 11/18/2017   Preventative health care 10/11/2016   Tobacco use disorder 10/10/2016   Dyslipidemia 10/10/2016   Hyperglycemia 10/10/2016   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   Seborrheic keratosis 03/06/2016   Back pain 03/06/2016   Vitamin D deficiency 06/11/2015   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 No, Number of falls: 0   MOI/History of condition:   Onset date: 4/8   SUBJECTIVE STATEMENT Pt reports her low back is feeling good and she is not experiencing pain. Pt notes only a little strain since the last session with prolonged standing.   From referring provider:    "Gail Gonzalez is a 62 y.o. female.    Patient is here for back pain, started yesterday.  She was getting out of the shower and bent over, and had pain and spasms.   Pain is across the low back.  She took tylenol/motrin, ice/heat without help yesterday.  No pain/numbness/tingling into the legs. "             Red flags:  denies BB changes and saddle anesthesia   Pain:  Are you  having pain? 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 Not provoked  Extension limited by 25%, w/ concordant pain Full, not provoked  Right lateral flexion limited by 50%, w/ concordant pain 25% limited, not provoked  Left lateral flexion limited by 50%, w/ concordant pain 25% limited, not provoked  Right rotation limited by 25% Full  Left rotation limited by 25% Full    (Blank rows = not tested)    LE MMT:   MMT Right 02/17/2023 Left 02/17/2023  Hip flexion (L2, L3) 4* 4+  Knee extension (L3) 4* 4+  Knee flexion      Hip abduction 4 3+*  Hip extension      Hip external rotation      Hip internal rotation      Hip adduction      Ankle dorsiflexion (L4) c c  Ankle plantarflexion (S1) c c  Ankle inversion      Ankle eversion      Great Toe ext (L5) c c  Grossly        (Blank rows = not tested, score listed is out of 5 possible points.  N = WNL, D = diminished, C = clear for gross weakness with myotome testing, * = concordant pain with testing)    LUMBAR SPECIAL TESTS:  Straight leg raise: L (-), R (-) Slump: L (-), R (-)   MUSCLE LENGTH: Hamstrings: Right no restriction; Left no restriction ASLR: Right ASLR = PSLR; Left ASLR = PSLR   Functional Tests   Eval (02/17/2023)                                                                                                               PALPATION:            TTP mid and lower lumbar paraspinals   PATIENT SURVEYS:  Modified Oswestry 28/50      TODAY'S TREATMENT: [facility] Adult PT Treatment:  DATE: 03/06/23 Therapeutic Exercise: Nustep L5 6 mins UE/LE Trunk flexion seated roll outs, forward and laterally Supine Lower Trunk Rotation  5 reps - 5 hold each SKTC x2 15" Supine Posterior Pelvic Tilt  10 reps - 5'' hold Hooklying Isometric Clamshell  10 reps - 5 sec Dead bug legs 2x10 each Bridging  10 5" Q'ped hip ext 2x10 alt each Trunk ROM    [facility] Adult PT Treatment:                                                 DATE: 03/04/23 Therapeutic Exercise: Nustep L5 6 mins UE/LE Trunk flexion seated roll outs, forward and laterally Supine Lower Trunk Rotation  5 reps - 5 hold each SKTC x2 15" Supine Posterior Pelvic Tilt  5 reps - 5'' hold 90/90 abdominal bracing 5x 10" Bridging  10 5" Q'ped hip ext 2x5 each Hinged hip STS with forward reach 10x Hinged hip lifting 2x5 15# on stool Shoulder rows 2x10 GTB Shoulder ext 2x10 GTB  [facility] Adult PT Treatment:                                                DATE: 02/26/23 Therapeutic Exercise: Nustep L5 5 mins UE/LE Supine Lower Trunk Rotation  10 reps - 3 hold each SKTC x2 15" Supine Posterior Pelvic Tilt  5 reps - 5'' hold 90/90 abdominal bracing 5x 10" Hooklying Isometric Clamshell  10 reps - 5 sec Bridging  x10 5" Q'ped hip ext x10 each Hinged hip STS with forward reach 10x Updated HEP Manual Therapy: STM/DTM to the lumbar paraspinals Self Care: Use of tennis ball on wall for low back massage  [facility] Adult PT Treatment:                                                DATE: 02/24/23 Therapeutic Exercise: Supine Lower Trunk Rotation  10 reps - 3 hold each SKTC x3 15" Supine Posterior Pelvic Tilt  10 reps - 5'' hold Supine Hip Adduction Isometric 10 reps - 10'' hold Hooklying Isometric Clamshell  10 reps - 5 sec Bridging  x10 5" Hinged hip c dowel for postural feedback x10. From Bari-mat. Then x5 with forward reach Updated HEP  Eval Creating, reviewing, and completing below HEP   Manual therapy: Skilled palpation to identify trigger points for TDN STM to all listed muscles following TDN   Trigger Point Dry-Needling  Treatment instructions: Expect mild to moderate muscle soreness. S/S of pneumothorax if dry needled over a lung field, and to seek immediate medical attention should they occur. Patient verbalized understanding of these instructions and education.   Patient Consent Given: Yes Education handout  provided: No Muscles treated: lumbar paraspinals Electrical stimulation performed: Yes Parameters: 8 min low frequency - milli amps - low intensity   PATIENT EDUCATION:  POC, diagnosis, prognosis, HEP, and outcome measures.  Pt educated via explanation, demonstration, and handout (HEP).  Pt confirms understanding verbally.    HOME EXERCISE PROGRAM: Access Code: [email or web address] URL: [email or web address] Date: 03/06/2023 Prepared by: Joellyn Rued  Exercises - Seated Flexion  Stretch with [employer]  - 1 x daily - 7 x weekly - 1 sets - 10 reps - 3-15 hold - Seated Hamstring Stretch  - 1 x daily - 7 x weekly - 1 sets - 3 reps - 20 hold - Supine Lower Trunk Rotation  - 1 x daily - 7 x weekly - 1 sets - 20 reps - 3 hold - Hooklying Single Knee to Chest Stretch  - 1 x daily - 7 x weekly - 1 sets - 3 reps - 15 hold - Supine Posterior Pelvic Tilt  - 2 x daily - 7 x weekly - 2 sets - 10 reps - 5'' hold - Supine Hip Adduction Isometric with Ball  - 1 x daily - 7 x weekly - 2 sets - 10 reps - 10'' hold - Hooklying Clamshell with Resistance  - 1 x daily - 7 x weekly - 2 sets - 10 reps - 5 hold - Supine Bridge  - 1 x daily - 7 x weekly - 2 sets - 10 reps - 5 hold - Beginner Front Arm Support  - 1 x daily - 7 x weekly - 2 sets - 10 reps - 3 hold - Sit to Stand Without Arm Support  - 1 x daily - 7 x weekly - 2 sets - 10 reps - Supine Dead Bug with Leg Extension  - 1 x daily - 7 x weekly - 2 sets - 10 reps - 3 hold   Treatment priorities     Eval (02/17/2023)                                                                                             ASSESSMENT:   CLINICAL IMPRESSION: PT was continued for lumbopelvic flexibility and strengthening. Reassessed trunk ROMs. All, but forward flexion were improved, and none reproduced pt's low back pain. Forward flexion was limited by both low back and hamstring tightness. Hamstring flexibility was added to pt's HEP. Demand for strengthening  was progressed. Pt tolerated prescribed therex without adverse effects. Pt is completing her therex properly and reports consistent completion at home. Pt is making appropriate progress. Pt will continue to benefit from skilled PT to address impairments for improved low back function with less pain.  OBJECTIVE IMPAIRMENTS: Pain, lumbar ROM, LE and core strength   ACTIVITY LIMITATIONS: bending, lifting, walking, standing   PERSONAL FACTORS: See medical history and pertinent history     REHAB POTENTIAL: Good   CLINICAL DECISION MAKING: Stable/uncomplicated   EVALUATION COMPLEXITY: Low     GOALS:     SHORT TERM GOALS: Target date: 03/17/2023   Gail Gonzalez will be >75% HEP compliant to improve carryover between sessions and facilitate independent management of condition Pt is to complete flexibility HEP daily and strengthening therex every other day  Evaluation (02/17/2023): ongoing Goal status: MET     LONG TERM GOALS: Target date: 04/14/2023   Gail Gonzalez will self report >/= 50% decrease in pain from evaluation to allow return to work   Evaluation/Baseline (02/17/2023): 10/10 max pain Goal status: INITIAL     2.  Gail Gonzalez will show a >/=  12 pt improvement in their ODI score (MCID is 6% or 3/50 pts) as a proxy for functional improvement    Evaluation/Baseline (02/17/2023): 28/50 pts Goal status: INITIAL     3.  Gail Gonzalez will be able to move from standing -> squatting position on floor -> to standing with min UE support to be able to perform job duties   Evaluation/Baseline (02/17/2023): unable Goal status: INITIAL   4.  Gail Gonzalez will be able to return to modified dance routine, not limited by pain   Evaluation/Baseline (02/17/2023): limited Goal status: INITIAL     5.  Gail Gonzalez will report confidence in self management of condition at time of discharge with advanced HEP   Evaluation/Baseline (02/17/2023): unable to self manage Goal status: INITIAL     PLAN: PT FREQUENCY: 1-2x/week    PT DURATION: 8 weeks (Ending 04/14/2023)   PLANNED INTERVENTIONS: Therapeutic exercises, Aquatic therapy, Therapeutic activity, Neuro Muscular re-education, Gait training, Patient/Family education, Joint mobilization, Dry Needling, Electrical stimulation, Spinal mobilization and/or manipulation, Moist heat, Taping, Vasopneumatic device, Ionotophoresis 4mg /ml Dexamethasone, and Manual therapy   Gail Varble MS, PT 03/06/23 11:34 AM

## 2023-03-06 ENCOUNTER — Ambulatory Visit: Payer: 59

## 2023-03-06 DIAGNOSIS — M6281 Muscle weakness (generalized): Secondary | ICD-10-CM

## 2023-03-06 DIAGNOSIS — M5459 Other low back pain: Secondary | ICD-10-CM | POA: Diagnosis not present

## 2023-03-10 ENCOUNTER — Ambulatory Visit: Payer: 59

## 2023-03-10 DIAGNOSIS — M5459 Other low back pain: Secondary | ICD-10-CM

## 2023-03-10 DIAGNOSIS — M6281 Muscle weakness (generalized): Secondary | ICD-10-CM

## 2023-03-10 NOTE — Therapy (Signed)
OUTPATIENT PHYSICAL THERAPY TREATMENT NOTE   Patient Name: Gail Gonzalez MRN: [id number] DOB:[birth year], 62 y.o., female Today's Date: 03/10/2023  PCP: Bess Kinds, MD  REFERRING PROVIDER: Juanda Chance, NP   END OF SESSION:   PT End of Session - 03/10/23 1503     Visit Number 6    Number of Visits --   1-2 visits per week   Date for PT Re-Evaluation 04/14/23    Authorization Type [employer] - ODI    PT Start Time 1503    PT Stop Time 1544    PT Time Calculation (min) 41 min    Activity Tolerance Patient tolerated treatment well    Behavior During Therapy [facility] for tasks assessed/performed                Past Medical History:  Diagnosis Date   Anemia    Anxiety    Arthritis    Cardiac arrhythmia due to congenital heart disease    Cataract    Chronic back pain    Colon polyps 04/24/2013   Diverticulosis 02/24/2013   Endometriosis    on lap surgery. seen by Dr. Su Hilt   Fatty liver    GERD (gastroesophageal reflux disease)    H. pylori infection    Heart murmur    Hemorrhoids    Internal hemorrhoids    Rectal bleeding    Thyroid disease 10/27/2010   nodule   Tubular adenoma of colon    Past Surgical History:  Procedure Laterality Date   BIOPSY THYROID  2013   BREAST BIOPSY Left    DIAGNOSTIC LAPAROSCOPY  1990   endometriosis   Patient Active Problem List   Diagnosis Date Noted   Pain of right lower extremity 01/06/2023   Fatigue 11/25/2022   Grief counseling 02/02/2022   Anxiety disorder 08/18/2020   Uterine leiomyoma 11/18/2017   Preventative health care 10/11/2016   Tobacco use disorder 10/10/2016   Dyslipidemia 10/10/2016   Hyperglycemia 10/10/2016   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   Seborrheic keratosis 03/06/2016   Back pain 03/06/2016   Vitamin D deficiency 06/11/2015   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 No, Number of falls: 0   MOI/History of condition:   Onset date: 4/8   SUBJECTIVE STATEMENT Pt reports she is not having pain LBP today, nor over the past weekend except with a forward flexion stretch as part of her HEP.   From referring provider:    "[name] is a 62 y.o. female.    Patient is here for back pain, started yesterday.  She was getting out of the shower and bent over, and had pain and spasms.   Pain is across the low back.  She took tylenol/motrin, ice/heat without help yesterday.  No pain/numbness/tingling into the legs. "             Red flags:  denies BB changes and saddle anesthesia   Pain:  Are you having  pain? 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 Not provoked  Extension limited by 25%, w/ concordant pain Full, not provoked  Right lateral flexion limited by 50%, w/ concordant pain 25% limited, not provoked  Left lateral flexion limited by 50%, w/ concordant pain 25% limited, not provoked  Right rotation limited by 25% Full  Left rotation limited by 25% Full    (Blank rows = not tested)    LE MMT:   MMT Right 02/17/2023 Left 02/17/2023  Hip flexion (L2, L3) 4* 4+  Knee extension (L3) 4* 4+  Knee flexion      Hip abduction 4 3+*  Hip extension      Hip external rotation      Hip internal rotation      Hip adduction      Ankle dorsiflexion (L4) c c  Ankle plantarflexion (S1) c c  Ankle inversion      Ankle eversion      Great Toe ext (L5) c c  Grossly        (Blank rows = not tested, score listed is out of 5 possible points.  N = WNL, D = diminished, C = clear for gross weakness with myotome testing, * = concordant pain with testing)    LUMBAR SPECIAL TESTS:  Straight leg raise: L (-), R (-) Slump: L (-), R (-)   MUSCLE LENGTH: Hamstrings: Right no restriction; Left no restriction ASLR: Right ASLR = PSLR; Left ASLR = PSLR   Functional Tests   Eval (02/17/2023)                                                                                                               PALPATION:            TTP mid and lower lumbar paraspinals   PATIENT SURVEYS:  Modified Oswestry 28/50      TODAY'S TREATMENT: [facility] Adult PT Treatment:  DATE: 03/10/23 Therapeutic Exercise: Nustep L5 7 mins UE/LE Trunk flexion seated roll outs, forward and laterally Supine Lower Trunk Rotation  5 reps - 5 hold each SKTC x2 15" Supine Posterior Pelvic Tilt 4 reps - 5'' hold Dead bug legs 2x10 each Q'ped hip ext 2x10 alt each Spring board position 8L 2x10 Sit to stand hinged hip x10 10#  Therapeutic Activity: ODI reassessment  [facility] Adult PT Treatment:                                                 DATE: 03/06/23 Therapeutic Exercise: Nustep L5 6 mins UE/LE Trunk flexion seated roll outs, forward and laterally Supine Lower Trunk Rotation  5 reps - 5 hold each SKTC x2 15" Supine Posterior Pelvic Tilt  10 reps - 5'' hold Hooklying Isometric Clamshell  10 reps - 5 sec Dead bug legs 2x10 each Bridging  10 5" Q'ped hip ext 2x10 alt each Trunk ROM    [facility] Adult PT Treatment:                                                DATE: 03/04/23 Therapeutic Exercise: Nustep L5 6 mins UE/LE Trunk flexion seated roll outs, forward and laterally Supine Lower Trunk Rotation  5 reps - 5 hold each SKTC x2 15" Supine Posterior Pelvic Tilt  5 reps - 5'' hold 90/90 abdominal bracing 5x 10" Bridging  10 5" Q'ped hip ext 2x5 each Hinged hip STS with forward reach 10x Hinged hip lifting 2x5 15# on stool Shoulder rows 2x10 GTB Shoulder ext 2x10 GTB   PATIENT EDUCATION:  POC, diagnosis, prognosis, HEP, and outcome measures.  Pt educated via explanation, demonstration, and handout (HEP).  Pt confirms understanding verbally.    HOME EXERCISE PROGRAM: Access Code: [email or web address] URL: [email or web address] Date: 03/06/2023 Prepared by: Joellyn Rued  Exercises - Seated Flexion Stretch with Swiss Ball  - 1 x daily - 7 x weekly - 1 sets - 10 reps - 3-15 hold - Seated Hamstring Stretch  - 1 x daily - 7 x weekly - 1 sets - 3 reps - 20 hold - Supine Lower Trunk Rotation  - 1 x daily - 7 x weekly - 1 sets - 20 reps - 3 hold - Hooklying Single Knee to Chest Stretch  - 1 x daily - 7 x weekly - 1 sets - 3 reps - 15 hold - Supine Posterior Pelvic Tilt  - 2 x daily - 7 x weekly - 2 sets - 10 reps - 5'' hold - Supine Hip Adduction Isometric with Ball  - 1 x daily - 7 x weekly - 2 sets - 10 reps - 10'' hold - Hooklying Clamshell with Resistance  - 1 x daily - 7 x weekly - 2 sets - 10 reps - 5 hold - Supine Bridge  - 1 x daily - 7 x weekly - 2 sets - 10 reps - 5  hold - Beginner Front Arm Support  - 1 x daily - 7 x weekly - 2 sets - 10 reps - 3 hold - Sit to Stand Without Arm Support  - 1 x daily - 7 x weekly - 2 sets - 10 reps - Supine Dead  Bug with Leg Extension  - 1 x daily - 7 x weekly - 2 sets - 10 reps - 3 hold   Treatment priorities     Eval (02/17/2023)                                                                                             ASSESSMENT:   CLINICAL IMPRESSION: PT was continued for lumbopelvic flexibility and strengthening. Pt continues to make good progress. The ODI was reassessed and found improved which correlates with pt's subjective report. Additionally, pt is showing good understanding of managing her LBP, and she participated in modified dancing this past weekend. Pt tolerated PT today without adverse effects.  OBJECTIVE IMPAIRMENTS: Pain, lumbar ROM, LE and core strength   ACTIVITY LIMITATIONS: bending, lifting, walking, standing   PERSONAL FACTORS: See medical history and pertinent history     REHAB POTENTIAL: Good   CLINICAL DECISION MAKING: Stable/uncomplicated   EVALUATION COMPLEXITY: Low     GOALS:     SHORT TERM GOALS: Target date: 03/17/2023   Delacey will be >75% HEP compliant to improve carryover between sessions and facilitate independent management of condition Pt is to complete flexibility HEP daily and strengthening therex every other day  Evaluation (02/17/2023): ongoing Goal status: MET     LONG TERM GOALS: Target date: 04/14/2023   Sachet will self report >/= 50% decrease in pain from evaluation to allow return to work   Evaluation/Baseline (02/17/2023): 10/10 max pain Goal status: INITIAL     2.  Teisha will show a >/= 12 pt improvement in their ODI score (MCID is 6% or 3/50 pts) as a proxy for functional improvement    Evaluation/Baseline (02/17/2023): 28/50 pts Status: 15/50  Goal status: MET     3.  Keven will be able to move from standing -> squatting position on  floor -> to standing with min UE support to be able to perform job duties   Evaluation/Baseline (02/17/2023): unable Goal status: INITIAL   4.  Janazia will be able to return to modified dance routine, not limited by pain   Evaluation/Baseline (02/17/2023): limited Status: pt reports completing a dance routine at a decreased pace Goal status: Improving     5.  Yuxin will report confidence in self management of condition at time of discharge with advanced HEP   Evaluation/Baseline (02/17/2023): unable to self manage Status: Pt reports she is managing her LBP per HEP, tylenol, heating pad Goal status: MET     PLAN: PT FREQUENCY: 1-2x/week   PT DURATION: 8 weeks (Ending 04/14/2023)   PLANNED INTERVENTIONS: Therapeutic exercises, Aquatic therapy, Therapeutic activity, Neuro Muscular re-education, Gait training, Patient/Family education, Joint mobilization, Dry Needling, Electrical stimulation, Spinal mobilization and/or manipulation, Moist heat, Taping, Vasopneumatic device, Ionotophoresis 4mg /ml Dexamethasone, and Manual therapy   Joellyn Rued MS, PT 03/10/23 5:53 PM

## 2023-03-11 NOTE — Therapy (Signed)
OUTPATIENT PHYSICAL THERAPY TREATMENT NOTE   Patient Name: Gail Gonzalez MRN: [id number] DOB:1961/06/03, 62 y.o., female Today's Date: 03/12/2023  PCP: Gail Kinds, MD  REFERRING PROVIDER: Juanda Chance, NP   END OF SESSION:   PT End of Session - 03/12/23 1414     Visit Number 7    Number of Visits --   1-2/week   Date for PT Re-Evaluation 04/14/23    Authorization Type [employer] - ODI    PT Start Time 1415    PT Stop Time 1458    PT Time Calculation (min) 43 min    Activity Tolerance Patient tolerated treatment well    Behavior During Therapy WFL for tasks assessed/performed                 Past Medical History:  Diagnosis Date   Anemia    Anxiety    Arthritis    Cardiac arrhythmia due to congenital heart disease    Cataract    Chronic back pain    Colon polyps 04/24/2013   Diverticulosis 02/24/2013   Endometriosis    on lap surgery. seen by Dr. Su Gonzalez   Fatty liver    GERD (gastroesophageal reflux disease)    H. pylori infection    Heart murmur    Hemorrhoids    Internal hemorrhoids    Rectal bleeding    Thyroid disease 10/27/2010   nodule   Tubular adenoma of colon    Past Surgical History:  Procedure Laterality Date   BIOPSY THYROID  2013   BREAST BIOPSY Left    DIAGNOSTIC LAPAROSCOPY  1990   endometriosis   Patient Active Problem List   Diagnosis Date Noted   Pain of right lower extremity 01/06/2023   Fatigue 11/25/2022   Grief counseling 02/02/2022   Anxiety disorder 08/18/2020   Uterine leiomyoma 11/18/2017   Preventative health care 10/11/2016   Tobacco use disorder 10/10/2016   Dyslipidemia 10/10/2016   Hyperglycemia 10/10/2016   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   Seborrheic keratosis 03/06/2016   Back pain 03/06/2016   Vitamin D deficiency 06/11/2015   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 No, Number of falls: 0   MOI/History of condition:   Onset date: 4/8   SUBJECTIVE STATEMENT Pt reports her low back is continuing to feel better and plans to walk her dog this afternoon.   From referring provider:    "Gail Gonzalez is a 62 y.o. female.    Patient is here for back pain, started yesterday.  She was getting out of the shower and bent over, and had pain and spasms.   Pain is across the low back.  She took tylenol/motrin, ice/heat without help yesterday.  No pain/numbness/tingling into the legs. "             Red flags:  denies BB changes and saddle anesthesia   Pain:  Are you having pain? 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 Not provoked  Extension limited by 25%,  w/ concordant pain Full, not provoked  Right lateral flexion limited by 50%, w/ concordant pain 25% limited, not provoked  Left lateral flexion limited by 50%, w/ concordant pain 25% limited, not provoked  Right rotation limited by 25% Full  Left rotation limited by 25% Full    (Blank rows = not tested)    LE MMT:   MMT Right 02/17/2023 Left 02/17/2023  Hip flexion (L2, L3) 4* 4+  Knee extension (L3) 4* 4+  Knee flexion      Hip abduction 4 3+*  Hip extension      Hip external rotation      Hip internal rotation      Hip adduction      Ankle dorsiflexion (L4) c c  Ankle plantarflexion (S1) c c  Ankle inversion      Ankle eversion      Great Toe ext (L5) c c  Grossly        (Blank rows = not tested, score listed is out of 5 possible points.  N = WNL, D = diminished, C = clear for gross weakness with myotome testing, * = concordant pain with testing)    LUMBAR SPECIAL TESTS:  Straight leg raise: L (-), R (-) Slump: L (-), R (-)   MUSCLE LENGTH: Hamstrings: Right no restriction; Left no restriction ASLR: Right ASLR = PSLR; Left ASLR = PSLR   Functional Tests   Eval (02/17/2023)                                                                                                               PALPATION:            TTP mid and lower lumbar paraspinals   PATIENT SURVEYS:  Modified Oswestry 28/50      TODAY'S TREATMENT: [facility] Adult PT Treatment:                                                DATE: 03/12/23 Therapeutic  Exercise: Elliptical L5 7 mins UE/LE FWB/BWD, Lateral steeping with low hurdles Trunk flexion seated roll outs, forward and laterally SKTC x2 15" DKTC x2 15" Banded bridging x10 5" SL bridging x10 each Dead bug legs 2x10 each FM diagonals high/low 7# and low high 3# x10 each Spring board position 8L 2x10  [facility] Adult PT Treatment:                                                DATE: 03/10/23 Therapeutic Exercise: Nustep L5 7 mins UE/LE Trunk  flexion seated roll outs, forward and laterally Supine Lower Trunk Rotation  5 reps - 5 hold each SKTC x2 15" Supine Posterior Pelvic Tilt 4 reps - 5'' hold Dead bug legs 2x10 each Q'ped hip ext 2x10 alt each Spring board position 8L 2x10 Sit to stand hinged hip x10 10#  Therapeutic Activity: ODI reassessment  [facility] Adult PT Treatment:                                                DATE: 03/06/23 Therapeutic Exercise: Nustep L5 6 mins UE/LE Trunk flexion seated roll outs, forward and laterally Supine Lower Trunk Rotation  5 reps - 5 hold each SKTC x2 15" Supine Posterior Pelvic Tilt  10 reps - 5'' hold Hooklying Isometric Clamshell  10 reps - 5 sec Dead bug legs 2x10 each Bridging  10 5" Q'ped hip ext 2x10 alt each Trunk ROM    PATIENT EDUCATION:  POC, diagnosis, prognosis, HEP, and outcome measures.  Pt educated via explanation, demonstration, and handout (HEP).  Pt confirms understanding verbally.    HOME EXERCISE PROGRAM: Access Code: [email or web address] URL: [email or web address] Date: 03/06/2023 Prepared by: Joellyn Rued  Exercises - Seated Flexion Stretch with Swiss Ball  - 1 x daily - 7 x weekly - 1 sets - 10 reps - 3-15 hold - Seated Hamstring Stretch  - 1 x daily - 7 x weekly - 1 sets - 3 reps - 20 hold - Supine Lower Trunk Rotation  - 1 x daily - 7 x weekly - 1 sets - 20 reps - 3 hold - Hooklying Single Knee to Chest Stretch  - 1 x daily - 7 x weekly - 1 sets - 3 reps - 15 hold - Supine Posterior Pelvic Tilt  - 2 x daily - 7 x weekly - 2 sets - 10 reps - 5'' hold - Supine Hip Adduction Isometric with Ball  - 1 x daily - 7 x weekly - 2 sets - 10 reps - 10'' hold - Hooklying Clamshell with Resistance  - 1 x daily - 7 x weekly - 2 sets - 10 reps - 5 hold - Supine Bridge  - 1 x daily - 7 x weekly - 2 sets - 10 reps - 5 hold - Beginner Front Arm Support  - 1 x daily - 7 x weekly - 2 sets - 10 reps - 3 hold - Sit to Stand Without Arm Support  - 1 x daily - 7 x weekly -  2 sets - 10 reps - Supine Dead Bug with Leg Extension  - 1 x daily - 7 x weekly - 2 sets - 10 reps - 3 hold  Treatment priorities     Eval (02/17/2023)                                                                                             ASSESSMENT:   CLINICAL IMPRESSION: PT was completed for lumbolepvic flexibility and strengthening. Strengthening was completed with dynamic movements patterns and with single leg stability to address pt's work and recreational pursuits. Pt tolerated the prescribed therex without adverse effects. Pt will continue to benefit from skilled PT to address impairments for improved trunk function.  OBJECTIVE IMPAIRMENTS: Pain, lumbar ROM, LE and core strength   ACTIVITY LIMITATIONS: bending, lifting, walking, standing   PERSONAL FACTORS: See medical history and pertinent history     REHAB POTENTIAL: Good   CLINICAL DECISION MAKING: Stable/uncomplicated   EVALUATION COMPLEXITY: Low     GOALS:     SHORT TERM GOALS: Target date: 03/17/2023   Aubreyana will be >75% HEP compliant to improve carryover between sessions and facilitate independent management of condition Pt is to complete flexibility HEP daily and strengthening therex every other day  Evaluation (02/17/2023): ongoing Goal status: MET     LONG TERM GOALS: Target date: 04/14/2023   Cesiah will self report >/= 50% decrease in pain from evaluation to allow return to work   Evaluation/Baseline (02/17/2023): 10/10 max pain Goal status: INITIAL     2.  Heldana will show a >/= 12 pt improvement in their ODI score (MCID is 6% or 3/50 pts) as a proxy for functional improvement    Evaluation/Baseline (02/17/2023): 28/50 pts Status: 15/50  Goal status: MET     3.  Cressa will be able to move from standing -> squatting position on floor -> to standing with min UE support to be able to perform job duties   Evaluation/Baseline (02/17/2023): unable Goal status: INITIAL   4.  Lyssette will  be able to return to modified dance routine, not limited by pain   Evaluation/Baseline (02/17/2023): limited Status: pt reports completing a dance routine at a decreased pace Goal status: Improving     5.  Sha will report confidence in self management of condition at time of discharge with advanced HEP   Evaluation/Baseline (02/17/2023): unable to self manage Status: Pt reports she is managing her LBP per HEP, tylenol, heating pad Goal status: MET     PLAN: PT FREQUENCY: 1-2x/week   PT DURATION: 8 weeks (Ending 04/14/2023)   PLANNED INTERVENTIONS: Therapeutic exercises, Aquatic therapy, Therapeutic activity, Neuro Muscular re-education, Gait training, Patient/Family education, Joint mobilization, Dry Needling, Electrical stimulation, Spinal mobilization and/or manipulation, Moist heat, Taping, Vasopneumatic device, Ionotophoresis 4mg /ml Dexamethasone, and Manual therapy   Alexsandro Salek MS, PT 03/12/23 3:04 PM

## 2023-03-12 ENCOUNTER — Ambulatory Visit: Payer: 59

## 2023-03-12 DIAGNOSIS — M5459 Other low back pain: Secondary | ICD-10-CM | POA: Diagnosis not present

## 2023-03-12 DIAGNOSIS — M6281 Muscle weakness (generalized): Secondary | ICD-10-CM

## 2023-03-17 ENCOUNTER — Ambulatory Visit: Payer: 59

## 2023-03-17 DIAGNOSIS — M5459 Other low back pain: Secondary | ICD-10-CM

## 2023-03-17 DIAGNOSIS — M6281 Muscle weakness (generalized): Secondary | ICD-10-CM

## 2023-03-17 NOTE — Therapy (Signed)
OUTPATIENT PHYSICAL THERAPY TREATMENT NOTE   Patient Name: Gail Gonzalez MRN: [id number] DOB:[birth year], 62 y.o., female Today's Date: 03/17/2023  PCP: Gail Kinds, MD   REFERRING PROVIDER: Juanda Chance, NP   END OF SESSION:   PT End of Session - 03/17/23 1512     Visit Number 8    Number of Visits --   1-2 visits per week   Date for PT Re-Evaluation 04/14/23    Authorization Type [employer] - ODI    PT Start Time 1504    PT Stop Time 1545    PT Time Calculation (min) 41 min    Activity Tolerance Patient tolerated treatment well    Behavior During Therapy [facility] for tasks assessed/performed                  Past Medical History:  Diagnosis Date   Anemia    Anxiety    Arthritis    Cardiac arrhythmia due to congenital heart disease    Cataract    Chronic back pain    Colon polyps 04/24/2013   Diverticulosis 02/24/2013   Endometriosis    on lap surgery. seen by Gail Gonzalez   Fatty liver    GERD (gastroesophageal reflux disease)    H. pylori infection    Heart murmur    Hemorrhoids    Internal hemorrhoids    Rectal bleeding    Thyroid disease 10/27/2010   nodule   Tubular adenoma of colon    Past Surgical History:  Procedure Laterality Date   BIOPSY THYROID  2013   BREAST BIOPSY Left    DIAGNOSTIC LAPAROSCOPY  1990   endometriosis   Patient Active Problem List   Diagnosis Date Noted   Pain of right lower extremity 01/06/2023   Fatigue 11/25/2022   Grief counseling 02/02/2022   Anxiety disorder 08/18/2020   Uterine leiomyoma 11/18/2017   Preventative health care 10/11/2016   Tobacco use disorder 10/10/2016   Dyslipidemia 10/10/2016   Hyperglycemia 10/10/2016   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   Seborrheic keratosis 03/06/2016   Back pain 03/06/2016   Vitamin D deficiency 06/11/2015   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 No, Number of falls: 0   MOI/History of condition:   Onset date: 4/8   SUBJECTIVE STATEMENT Pt reports she is going to see a sports medicine MD tomorrow due to a R knee issue, possibly re: the ITB. Pt reports her low back continues to be much better, primarily pain free.   From referring provider:    "Gail Gonzalez is a 62 y.o. female.    Patient is here for back pain, started yesterday.  She was getting out of the shower and bent over, and had pain and spasms.   Pain is across the low back.  She took tylenol/motrin, ice/heat without help yesterday.  No pain/numbness/tingling into the legs. "             Red flags:  denies BB  changes and saddle anesthesia   Pain:  Are you having pain? 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 Not provoked  Extension limited by 25%, w/ concordant pain Full, not provoked  Right lateral flexion limited by 50%, w/ concordant pain 25% limited, not provoked  Left lateral flexion limited by 50%, w/ concordant pain 25% limited, not provoked  Right rotation limited by 25% Full  Left rotation limited by 25% Full    (Blank rows = not tested)    LE MMT:   MMT Right 02/17/2023 Left 02/17/2023  Hip flexion (L2, L3) 4* 4+  Knee extension (L3) 4* 4+  Knee flexion      Hip abduction 4 3+*  Hip extension      Hip external rotation      Hip internal rotation      Hip adduction      Ankle dorsiflexion (L4) c c  Ankle plantarflexion (S1) c c  Ankle inversion      Ankle eversion      Great Toe ext (L5) c c  Grossly        (Blank rows = not tested, score listed is out of 5 possible points.  N = WNL, D = diminished, C = clear for gross weakness with myotome testing, * = concordant pain with testing)    LUMBAR SPECIAL TESTS:  Straight leg raise: L (-), R (-) Slump: L (-), R (-)   MUSCLE LENGTH: Hamstrings: Right no restriction; Left no restriction ASLR: Right ASLR = PSLR; Left ASLR = PSLR   Functional Tests   Eval (02/17/2023)                                                                                                               PALPATION:            TTP mid and lower lumbar paraspinals   PATIENT SURVEYS:  Modified Oswestry 28/50      TODAY'S TREATMENT: [facility] Adult PT Treatment:  DATE: 03/17/23 Therapeutic Exercise: Nustep L5 7 mins UE/LE Trunk flexion seated roll outs, forward and laterally Supine Lower Trunk Rotation  5 reps - 5 hold each SKTC x2 15" DKTC x2 15" Shoulder rows BluTB  2x10 Shoulder ext BluTB 2x10 Paloff press GTB double 2x10e ach STS from 18" seat x10, min R lateral knee pain Spring board position 8L 2x10 Dead lifts 2x8  15#  [facility] Adult PT Treatment:                                                DATE: 03/12/23 Therapeutic Exercise: Elliptical L5 7 mins UE/LE FWB/BWD, Lateral steeping with low hurdles Trunk flexion seated roll outs, forward and laterally SKTC x2 15" DKTC x2 15" Banded bridging x10 5" SL bridging x10 each Dead bug legs 2x10 each FM diagonals high/low 7# and low high 3# x10 each Spring board position 8L 2x10  [facility] Adult PT Treatment:                                                DATE: 03/10/23 Therapeutic Exercise: Nustep L5 7 mins UE/LE Trunk flexion seated roll outs, forward and laterally Supine Lower Trunk Rotation  5 reps - 5 hold each SKTC x2 15" Supine Posterior Pelvic Tilt 4 reps - 5'' hold Dead bug legs 2x10 each Q'ped hip ext 2x10 alt each Spring board position 8L 2x10 Sit to stand hinged hip x10 10#  Therapeutic Activity: ODI reassessment  [facility] Adult PT Treatment:                                                DATE: 03/06/23 Therapeutic Exercise: Nustep L5 6 mins UE/LE Trunk flexion seated roll outs, forward and laterally Supine Lower Trunk Rotation  5 reps - 5 hold each SKTC x2 15" Supine Posterior Pelvic Tilt  10 reps - 5'' hold Hooklying Isometric Clamshell  10 reps - 5 sec Dead bug legs 2x10 each Bridging  10 5" Q'ped hip ext 2x10 alt each Trunk ROM    PATIENT EDUCATION:  POC, diagnosis, prognosis, HEP, and outcome measures.  Pt educated via explanation, demonstration, and handout (HEP).  Pt confirms understanding verbally.    HOME EXERCISE PROGRAM: Access Code: [email or web address] URL: [email or web address] Date: 03/06/2023 Prepared by: Joellyn Rued  Exercises - Seated Flexion Stretch with Swiss Ball  - 1 x daily - 7 x weekly - 1 sets - 10 reps - 3-15 hold - Seated Hamstring Stretch  - 1 x daily - 7 x weekly - 1 sets - 3 reps - 20 hold - Supine Lower Trunk Rotation  - 1 x daily - 7 x weekly - 1 sets - 20 reps - 3 hold - Hooklying Single Knee to  Chest Stretch  - 1 x daily - 7 x weekly - 1 sets - 3 reps - 15 hold - Supine Posterior Pelvic Tilt  - 2 x daily - 7 x weekly - 2 sets - 10 reps - 5'' hold - Supine Hip Adduction Isometric with Ball  - 1 x daily - 7 x weekly - 2  sets - 10 reps - 10'' hold - Hooklying Clamshell with Resistance  - 1 x daily - 7 x weekly - 2 sets - 10 reps - 5 hold - Supine Bridge  - 1 x daily - 7 x weekly - 2 sets - 10 reps - 5 hold - Beginner Front Arm Support  - 1 x daily - 7 x weekly - 2 sets - 10 reps - 3 hold - Sit to Stand Without Arm Support  - 1 x daily - 7 x weekly - 2 sets - 10 reps - Supine Dead Bug with Leg Extension  - 1 x daily - 7 x weekly - 2 sets - 10 reps - 3 hold   Treatment priorities     Eval (02/17/2023)                                                                                             ASSESSMENT:   CLINICAL IMPRESSION: LTG #3 To move from standing -> squatting position on floor was deferred at this time related to R knee which is going to be assessed by a sport medicine MD tomorrow. Squatting was limited to 18 inches, which the pt toleated with min R knee pain. PT was continued with flexibility and strengthening therex for the core and legs. Pt tolerated prescribed therex today today without adverse effects. Pt is responding well to back rehab. Complete job related activities as tolerated.  OBJECTIVE IMPAIRMENTS: Pain, lumbar ROM, LE and core strength   ACTIVITY LIMITATIONS: bending, lifting, walking, standing   PERSONAL FACTORS: See medical history and pertinent history     REHAB POTENTIAL: Good   CLINICAL DECISION MAKING: Stable/uncomplicated   EVALUATION COMPLEXITY: Low     GOALS:     SHORT TERM GOALS: Target date: 03/17/2023   Billie will be >75% HEP compliant to improve carryover between sessions and facilitate independent management of condition Pt is to complete flexibility HEP daily and strengthening therex every other day  Evaluation (02/17/2023):  ongoing Goal status: MET     LONG TERM GOALS: Target date: 04/14/2023   Tany will self report >/= 50% decrease in pain from evaluation to allow return to work   Evaluation/Baseline (02/17/2023): 10/10 max pain Goal status: INITIAL     2.  Kieana will show a >/= 12 pt improvement in their ODI score (MCID is 6% or 3/50 pts) as a proxy for functional improvement    Evaluation/Baseline (02/17/2023): 28/50 pts Status: 15/50  Goal status: MET     3.  Ingeborg will be able to move from standing -> squatting position on floor -> to standing with min UE support to be able to perform job duties   Evaluation/Baseline (02/17/2023): unable Goal status: INITIAL   4.  Migdalia will be able to return to modified dance routine, not limited by pain   Evaluation/Baseline (02/17/2023): limited Status: pt reports completing a dance routine at a decreased pace Goal status: Improving     5.  Lorenna will report confidence in self management of condition at time of discharge with advanced HEP   Evaluation/Baseline (02/17/2023): unable to self manage  Status: Pt reports she is managing her LBP per HEP, tylenol, heating pad Goal status: MET     PLAN: PT FREQUENCY: 1-2x/week   PT DURATION: 8 weeks (Ending 04/14/2023)   PLANNED INTERVENTIONS: Therapeutic exercises, Aquatic therapy, Therapeutic activity, Neuro Muscular re-education, Gait training, Patient/Family education, Joint mobilization, Dry Needling, Electrical stimulation, Spinal mobilization and/or manipulation, Moist heat, Taping, Vasopneumatic device, Ionotophoresis 4mg /ml Dexamethasone, and Manual therapy   Lillyanna Glandon MS, PT 03/17/23 5:23 PM

## 2023-03-18 ENCOUNTER — Ambulatory Visit (INDEPENDENT_AMBULATORY_CARE_PROVIDER_SITE_OTHER): Payer: 59 | Admitting: Sports Medicine

## 2023-03-18 ENCOUNTER — Other Ambulatory Visit: Payer: Self-pay

## 2023-03-18 VITALS — BP 120/60 | Ht 69.0 in | Wt 160.0 lb

## 2023-03-18 DIAGNOSIS — M25561 Pain in right knee: Secondary | ICD-10-CM

## 2023-03-18 NOTE — Therapy (Signed)
OUTPATIENT PHYSICAL THERAPY TREATMENT NOTE   Patient Name: Gail Gonzalez MRN: [id number] DOB:[birth year], 62 y.o., female Today's Date: 03/19/2023  PCP: Bess Kinds, MD   REFERRING PROVIDER: Juanda Chance, NP   END OF SESSION:   PT End of Session - 03/19/23 1508     Visit Number 9    Number of Visits --   1-2 x per week   Date for PT Re-Evaluation 04/14/23    Authorization Type [employer] - ODI    PT Start Time 1503    PT Stop Time 1545    PT Time Calculation (min) 42 min    Activity Tolerance Patient tolerated treatment well    Behavior During Therapy WFL for tasks assessed/performed                   Past Medical History:  Diagnosis Date   Anemia    Anxiety    Arthritis    Cardiac arrhythmia due to congenital heart disease    Cataract    Chronic back pain    Colon polyps 04/24/2013   Diverticulosis 02/24/2013   Endometriosis    on lap surgery. seen by Dr. Su Hilt   Fatty liver    GERD (gastroesophageal reflux disease)    H. pylori infection    Heart murmur    Hemorrhoids    Internal hemorrhoids    Rectal bleeding    Thyroid disease 10/27/2010   nodule   Tubular adenoma of colon    Past Surgical History:  Procedure Laterality Date   BIOPSY THYROID  2013   BREAST BIOPSY Left    DIAGNOSTIC LAPAROSCOPY  1990   endometriosis   Patient Active Problem List   Diagnosis Date Noted   Pain of right lower extremity 01/06/2023   Fatigue 11/25/2022   Grief counseling 02/02/2022   Anxiety disorder 08/18/2020   Uterine leiomyoma 11/18/2017   Preventative health care 10/11/2016   Tobacco use disorder 10/10/2016   Dyslipidemia 10/10/2016   Hyperglycemia 10/10/2016   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   Seborrheic keratosis 03/06/2016   Back pain 03/06/2016   Vitamin D deficiency 06/11/2015   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 No, Number of falls: 0   MOI/History of condition:   Onset date: 4/8   SUBJECTIVE STATEMENT Pt reports no LBP. Pt reports seeing a Sports Medicine MD and an US revealed a torn meniscus of the R knee. Pt reports a MRI has been ordered for further assessment.   From referring provider:    "[name] is a 62 y.o. female.    Patient is here for back pain, started yesterday.  She was getting out of the shower and bent over, and had pain and spasms.   Pain is across the low back.  She took tylenol/motrin, ice/heat without help yesterday.  No pain/numbness/tingling into the legs. "             Red flags:  denies BB changes and  saddle anesthesia   Pain:  Are you having pain? 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 Not provoked  Extension limited by 25%, w/ concordant pain Full, not provoked  Right lateral flexion limited by 50%, w/ concordant pain 25% limited, not provoked  Left lateral flexion limited by 50%, w/ concordant pain 25% limited, not provoked  Right rotation limited by 25% Full  Left rotation limited by 25% Full    (Blank rows = not tested)    LE MMT:   MMT Right 02/17/2023 Left 02/17/2023  Hip flexion (L2, L3) 4* 4+  Knee extension (L3) 4* 4+  Knee flexion      Hip abduction 4 3+*  Hip extension      Hip external rotation      Hip internal rotation      Hip adduction      Ankle dorsiflexion (L4) c c  Ankle plantarflexion (S1) c c  Ankle inversion      Ankle eversion      Great Toe ext (L5) c c  Grossly        (Blank rows = not tested, score listed is out of 5 possible points.  N = WNL, D = diminished, C = clear for gross weakness with myotome testing, * = concordant pain with testing)    LUMBAR SPECIAL TESTS:  Straight leg raise: L (-), R (-) Slump: L (-), R (-)   MUSCLE LENGTH: Hamstrings: Right no restriction; Left no restriction ASLR: Right ASLR = PSLR; Left ASLR = PSLR   Functional Tests   Eval (02/17/2023)                                                                                                               PALPATION:            TTP mid and lower lumbar paraspinals   PATIENT SURVEYS:  Modified Oswestry 28/50      TODAY'S TREATMENT: [facility] Adult PT Treatment:  DATE: 03/19/23 Therapeutic Exercise: Nustep L5 7 mins UE/LE Trunk flexion seated roll outs, forward and laterally Supine Lower Trunk Rotation  5 reps - 5 hold each SKTC x2 15" Supine Posterior Pelvic Tilt 4 reps - 5'' hold Dead bug legs 2x10 each Q'ped hip ext 2x10 alt each Paloff side steps double GTB 8 ftx5  [facility] Adult PT Treatment:                                                DATE:  03/17/23 Therapeutic Exercise: Nustep L5 7 mins UE/LE Trunk flexion seated roll outs, forward and laterally Supine Lower Trunk Rotation  5 reps - 5 hold each SKTC x2 15" DKTC x2 15" Shoulder rows BluTB  2x10 Shoulder ext BluTB 2x10 Paloff press GTB double 2x10e ach STS from 18" seat x10, min R lateral knee pain Spring board position 8L 2x10 Dead lifts 2x8 15#  [facility] Adult PT Treatment:                                                DATE: 03/12/23 Therapeutic Exercise: Elliptical L5 7 mins UE/LE FWB/BWD, Lateral steeping with low hurdles Trunk flexion seated roll outs, forward and laterally SKTC x2 15" DKTC x2 15" Banded bridging x10 5" SL bridging x10 each Dead bug legs 2x10 each FM diagonals high/low 7# and low high 3# x10 each Spring board position 8L 2x10  [facility] Adult PT Treatment:                                                DATE: 03/10/23 Therapeutic Exercise: Nustep L5 7 mins UE/LE Trunk flexion seated roll outs, forward and laterally Supine Lower Trunk Rotation  5 reps - 5 hold each SKTC x2 15" Supine Posterior Pelvic Tilt 4 reps - 5'' hold Dead bug legs 2x10 each Q'ped hip ext 2x10 alt each Spring board position 8L 2x10 Sit to stand hinged hip x10 10#  Therapeutic Activity: ODI reassessment  [facility] Adult PT Treatment:                                                DATE: 03/06/23 Therapeutic Exercise: Nustep L5 6 mins UE/LE Trunk flexion seated roll outs, forward and laterally Supine Lower Trunk Rotation  5 reps - 5 hold each SKTC x2 15" Supine Posterior Pelvic Tilt  10 reps - 5'' hold Hooklying Isometric Clamshell  10 reps - 5 sec Dead bug legs 2x10 each Bridging  10 5" Q'ped hip ext 2x10 alt each Trunk ROM    PATIENT EDUCATION:  POC, diagnosis, prognosis, HEP, and outcome measures.  Pt educated via explanation, demonstration, and handout (HEP).  Pt confirms understanding verbally.    HOME EXERCISE PROGRAM: Access Code: [email or web address] URL:  [email or web address] Date: 03/06/2023 Prepared by: Joellyn Rued  Exercises - Seated Flexion Stretch with Swiss Ball  - 1 x daily - 7 x weekly - 1 sets - 10 reps - 3-15 hold -  Seated Hamstring Stretch  - 1 x daily - 7 x weekly - 1 sets - 3 reps - 20 hold - Supine Lower Trunk Rotation  - 1 x daily - 7 x weekly - 1 sets - 20 reps - 3 hold - Hooklying Single Knee to Chest Stretch  - 1 x daily - 7 x weekly - 1 sets - 3 reps - 15 hold - Supine Posterior Pelvic Tilt  - 2 x daily - 7 x weekly - 2 sets - 10 reps - 5'' hold - Supine Hip Adduction Isometric with Ball  - 1 x daily - 7 x weekly - 2 sets - 10 reps - 10'' hold - Hooklying Clamshell with Resistance  - 1 x daily - 7 x weekly - 2 sets - 10 reps - 5 hold - Supine Bridge  - 1 x daily - 7 x weekly - 2 sets - 10 reps - 5 hold - Beginner Front Arm Support  - 1 x daily - 7 x weekly - 2 sets - 10 reps - 3 hold - Sit to Stand Without Arm Support  - 1 x daily - 7 x weekly - 2 sets - 10 reps - Supine Dead Bug with Leg Extension  - 1 x daily - 7 x weekly - 2 sets - 10 reps - 3 hold   Treatment priorities     Eval (02/17/2023)                                                                                             ASSESSMENT:   CLINICAL IMPRESSION: With Dx of R knee meniscus tear from [place], deep knee bending therex to prepare pt for RTW has been discontinued until further MRI assessment. Pt was continued for lumbopelvic flexibility, strengthening and stability. Pt's low back pain and function is improving. Possible DC from PT service re: low back next week if pt continues to doing well. RTW may be limited by a R knee meniscus issue. Pt tolerated PT today without adverse effects.  OBJECTIVE IMPAIRMENTS: Pain, lumbar ROM, LE and core strength   ACTIVITY LIMITATIONS: bending, lifting, walking, standing   PERSONAL FACTORS: See medical history and pertinent history     REHAB POTENTIAL: Good   CLINICAL DECISION MAKING:  Stable/uncomplicated   EVALUATION COMPLEXITY: Low     GOALS:     SHORT TERM GOALS: Target date: 03/17/2023   Jaidyn will be >75% HEP compliant to improve carryover between sessions and facilitate independent management of condition Pt is to complete flexibility HEP daily and strengthening therex every other day  Evaluation (02/17/2023): ongoing Goal status: MET     LONG TERM GOALS: Target date: 04/14/2023   Aayat will self report >/= 50% decrease in pain from evaluation to allow return to work   Evaluation/Baseline (02/17/2023): 10/10 max pain Goal status: INITIAL     2.  Jennilee will show a >/= 12 pt improvement in their ODI score (MCID is 6% or 3/50 pts) as a proxy for functional improvement    Evaluation/Baseline (02/17/2023): 28/50 pts Status: 15/50  Goal status: MET     3.  Ayna will be able to move from standing -> squatting position on floor -> to standing with min UE support to be able to perform job duties   Evaluation/Baseline (02/17/2023): unable Goal status: 03/19/23 deferred to avoid deep knee bending c R meniscus tear   4.  Gereldine will be able to return to modified dance routine, not limited by pain   Evaluation/Baseline (02/17/2023): limited Status: pt reports completing a dance routine at a decreased pace Goal status: Improving     5.  Tynesia will report confidence in self management of condition at time of discharge with advanced HEP   Evaluation/Baseline (02/17/2023): unable to self manage Status: Pt reports she is managing her LBP per HEP, tylenol, heating pad Goal status: MET     PLAN: PT FREQUENCY: 1-2x/week   PT DURATION: 8 weeks (Ending 04/14/2023)   PLANNED INTERVENTIONS: Therapeutic exercises, Aquatic therapy, Therapeutic activity, Neuro Muscular re-education, Gait training, Patient/Family education, Joint mobilization, Dry Needling, Electrical stimulation, Spinal mobilization and/or manipulation, Moist heat, Taping, Vasopneumatic device,  Ionotophoresis 4mg /ml Dexamethasone, and Manual therapy   Joellyn Rued MS, PT 03/19/23 8:52 PM

## 2023-03-18 NOTE — Progress Notes (Unsigned)
Gail Gonzalez - 62 y.o. female MRN [id number]  Date of birth: [birth year]    CHIEF COMPLAINT:   R Knee Pain    SUBJECTIVE:   HPI:  Pleasant 62 year old female comes to clinic to be evaluated for right knee pain.  She has had right knee pain for decades dating back to her time as a teenager.  She states she poked herself with a needle over the superior lateral aspect of the knee and she has never had full sensation over the lateral aspect of the knee ever since.  Over the last several months she has had intermittent pain and swelling of the knee.  She reports pain over the lateral aspect of the knee.  She says the knee will buckle and give out on her.  It also catches and locks on her with walking.  She has difficulty going up stairs.  She tried topical treatment such as Tiger balm that has not made it better.  She also tried ibuprofen which did not help either.  She saw her primary care doctor who prescribed her some IT band exercises but she has not tried these yet.  No numbness or tingling down the leg.  No pain in the hip.  She does have a history of low back pain as well.  ROS:     See HPI  PERTINENT  PMH / PSH FH / / SH:  Past Medical, Surgical, Social, and Family History Reviewed & Updated in the EMR.  Pertinent findings include:  none  OBJECTIVE: There were no vitals taken for this visit.  Physical Exam:  Vital signs are reviewed.  GEN: Alert and oriented, NAD Pulm: Breathing unlabored PSY: normal mood, congruent affect  MSK: Right hip - no gross deformity.  Nontender palpation at the anterior or lateral aspects of the hip.  No pain with passive hip flexion.  4/5 strength with resisted hip flexion.  4/5 strength with resisted hip abduction.  Negative FABER.  Negative FADIR.  She is neurovascular intact distally.  Right Knee - no gross deformity.  No overlying skin changes.  She is got a mild soft tissue swelling.  She is tender palpation over the lateral joint line.  Nontender  palpation medial joint line.  Nontender palpation over the patella.  Negative patellar apprehension.  Range of motion of the knee is full.  4/5 strength throughout.  No ligamentous instability with valgus or varus stress.  Firm endpoint on Lachman.  Equivocal McMurray.  Positive Thessaly test elicits pain laterally.  ULTRASOUND: MSK ultrasound knee: Images were obtained both in the transverse and longitudinal plane. Patellar and quadriceps tendons were well visualized with no abnormalities. Moderate sized effusion in the suprapatellar pouch. Medial and lateral menisci were well visualized with both menisci extruding out of the joint line bilaterally.  On the lateral meniscus there is hypoechoic changes seen within the mid substance of the meniscus.  Impression: Moderate-sized effusion with signs of degenerative meniscal disease at both the medial lateral joint lines  Interpreted by myself and Dr. Norton Blizzard on 03/18/23   ASSESSMENT & PLAN:  1. R Knee Pain  -This is a chronic persistent problem has not gotten better with conservative measures.  She has an effusion, lateral joint line tenderness, mechanical symptoms as well as ultrasound findings that are concerning for lateral meniscus tear.  I think patient benefit from an MRI to better understand the anatomy of the knee and to rule out any internal derangement such as a lateral meniscus tear.  I encouraged her to use ice over the lateral aspect of the knee and compression to help with swelling.  Also have her start some IT band exercises as well.  I do not think her remote history of needle poke has much significance here.  Will follow-up with her after MRI to discuss next steps.  I would consider corticosteroid injection as a neck step.  Arvella Nigh, MD PGY-4, Sports Medicine Fellow [facility]  Addendum:  I was the preceptor for this visit and available for immediate consultation.  Norton Blizzard MD Marrianne Mood

## 2023-03-19 ENCOUNTER — Encounter: Payer: Self-pay | Admitting: Sports Medicine

## 2023-03-19 ENCOUNTER — Other Ambulatory Visit: Payer: 59

## 2023-03-19 ENCOUNTER — Ambulatory Visit: Payer: 59

## 2023-03-19 DIAGNOSIS — M6281 Muscle weakness (generalized): Secondary | ICD-10-CM

## 2023-03-19 DIAGNOSIS — M5459 Other low back pain: Secondary | ICD-10-CM | POA: Diagnosis not present

## 2023-03-24 ENCOUNTER — Encounter: Payer: Self-pay | Admitting: Physical Medicine and Rehabilitation

## 2023-03-24 ENCOUNTER — Telehealth: Payer: Self-pay | Admitting: Physical Medicine and Rehabilitation

## 2023-03-24 ENCOUNTER — Ambulatory Visit (INDEPENDENT_AMBULATORY_CARE_PROVIDER_SITE_OTHER): Payer: 59 | Admitting: Physical Medicine and Rehabilitation

## 2023-03-24 DIAGNOSIS — M545 Low back pain, unspecified: Secondary | ICD-10-CM

## 2023-03-24 DIAGNOSIS — M7918 Myalgia, other site: Secondary | ICD-10-CM

## 2023-03-24 DIAGNOSIS — S39012D Strain of muscle, fascia and tendon of lower back, subsequent encounter: Secondary | ICD-10-CM | POA: Diagnosis not present

## 2023-03-24 NOTE — Progress Notes (Unsigned)
Gail Gonzalez - 62 y.o. female MRN [id number]  Date of birth: October 11, 1961  Office Visit Note: Visit Date: 03/24/2023 PCP: Bess Kinds, MD Referred by: Bess Kinds, MD  Subjective: Chief Complaint  Patient presents with   Lower Back - Pain   HPI: Gail Gonzalez is a 62 y.o. female who comes in today for evaluation of acute bilateral lower back pain. Patient is here today in follow up from recent regime of physical therapy. Pain ongoing for several weeks, pain started when she bent over to dry feet post shower. Her pain worsens with movement and activity. She describes pain as cramping and tightness sensation, currently denies pain. Patient recently attended regimen of formal physical therapy, she reports significant and sustained relief of pain with these treatments. States she feels much better and is able to perform daily activities without severe pain. She reports recent issues with right knee pain, currently being treated by Dr. Durene Cal Rafoth with [facility], pending MRI of right knee. Patient states she remains out of work, requesting note for light duty over the next 2 weeks. Patient denies focal weakness, numbness and tingling. No recent trauma or falls.    Review of Systems  Musculoskeletal:  Positive for joint pain. Negative for back pain.  Neurological:  Negative for tingling, sensory change, focal weakness and weakness.  All other systems reviewed and are negative.  Otherwise per HPI.  Assessment & Plan: Visit Diagnoses:    ICD-10-CM   1. Strain of lumbar region, subsequent encounter  S39.012D     2. Acute bilateral low back pain without sciatica  M54.50     3. Myofascial pain syndrome  M79.18        Plan: Findings:  Acute bilateral lower back pain.  No radicular symptoms.  Significant and sustained relief of bilateral lower back pain with recent regimen of formal physical therapy.  Patient continues with conservative therapies as needed.  Her clinical  presentation and exam remain consistent with myofascial pain syndrome.  I discussed regrouping with physical therapy as needed.  I did provide patient with work note for light duty the next 2 weeks, she is concerned that this note will not be excepted as her employer previously told her they are not able to offer light duty.  Patient is actively seeking other employment opportunities.  I encouraged her to remain active and to continue with directed home exercise regimen.  If her lower back pain persists we discussed the possibility of x-rays and/or lumbar MRI imaging.  She is scheduled for MRI imaging of the right knee in the coming weeks.  No red flag symptoms noted upon exam today.    Meds & Orders: No orders of the defined types were placed in this encounter.  No orders of the defined types were placed in this encounter.   Follow-up: Return if symptoms worsen or fail to improve.   Procedures: No procedures performed      Clinical History: No specialty comments available.   She reports that she quit smoking about 18 months ago. Her smoking use included cigarettes. She has a 3.75 pack-year smoking history. She has been exposed to tobacco smoke. She has never used smokeless tobacco.  Recent Labs    06/27/22 0947  HGBA1C 5.5    Objective:  VS:  HT:    WT:   BMI:     BP:   HR: bpm  TEMP: ( )  RESP:  Physical Exam Vitals and nursing note reviewed.  HENT:     Head: Normocephalic and atraumatic.     Right Ear: External ear normal.     Left Ear: External ear normal.     Nose: Nose normal.     Mouth/Throat:     Mouth: Mucous membranes are moist.  Eyes:     Extraocular Movements: Extraocular movements intact.  Cardiovascular:     Rate and Rhythm: Normal rate.     Pulses: Normal pulses.  Pulmonary:     Effort: Pulmonary effort is normal.  Abdominal:     General: Abdomen is flat. There is no distension.  Musculoskeletal:        General: No tenderness.     Cervical back: Normal  range of motion.     Comments: Patient rises from seated position to standing without difficulty. Good lumbar range of motion. No pain noted with facet loading. 5/5 strength noted with bilateral hip flexion, knee flexion/extension, ankle dorsiflexion/plantarflexion and EHL. No clonus noted bilaterally. No pain upon palpation of greater trochanters. No pain with internal/external rotation of bilateral hips. Sensation intact bilaterally. Negative slump test bilaterally. Ambulates without aid, gait steady.     Skin:    General: Skin is warm and dry.     Capillary Refill: Capillary refill takes less than 2 seconds.  Neurological:     General: No focal deficit present.     Mental Status: She is alert and oriented to person, place, and time.  Psychiatric:        Mood and Affect: Mood normal.        Behavior: Behavior normal.     Ortho Exam  Imaging: No results found.  Past Medical/Family/Surgical/Social History: Medications & Allergies reviewed per EMR, new medications updated. 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 seen by Dr. Su Hilt   Fatty liver    GERD (gastroesophageal reflux disease)    H. pylori infection    Heart murmur    Hemorrhoids    Internal hemorrhoids    Rectal bleeding    Thyroid disease 10/27/2010   nodule   Tubular adenoma of colon    Family History  Problem Relation Age of Onset   Alcohol abuse Mother    Cancer Mother        breast   Diabetes Mother    Hypertension Mother    Colon polyps Mother    Alcohol abuse Father    Prostate cancer Brother    Uterine cancer Maternal Aunt    Colon cancer Maternal Uncle    Esophageal cancer Neg  Hx    Rectal cancer Neg Hx    Stomach cancer Neg Hx    Past Surgical History:  Procedure Laterality Date   BIOPSY THYROID  2013   BREAST BIOPSY Left    DIAGNOSTIC LAPAROSCOPY  1990   endometriosis   Social History   Occupational History   Occupation: Retail banker   Occupation: [employer]  Tobacco Use   Smoking status: Former    Packs/day: 0.25    Years: 15.00    Additional pack years: 0.00    Total pack years: 3.75    Types: Cigarettes    Quit date: 08/27/2021    Years since quitting: 1.5    Passive exposure: Past   Smokeless tobacco: Never  Vaping Use   Vaping Use: Never used  Substance and Sexual Activity   Alcohol use: Yes    Alcohol/week: 4.0 standard drinks of alcohol    Types: 4 Standard drinks or equivalent per week    Comment: a glass wine once a week   Drug use: No   Sexual activity: Yes    Birth control/protection: Post-menopausal

## 2023-03-24 NOTE — Progress Notes (Unsigned)
Functional Pain Scale - descriptive words and definitions  No Pain (0)   No Pain/Loss of function  Average Pain 0  Lower back pain. PT helped a lot. She has no pain

## 2023-03-24 NOTE — Telephone Encounter (Signed)
NA

## 2023-03-26 NOTE — Addendum Note (Signed)
Addended by: Annita Brod on: 03/26/2023 01:52 PM   Modules accepted: Orders

## 2023-04-02 ENCOUNTER — Ambulatory Visit (HOSPITAL_BASED_OUTPATIENT_CLINIC_OR_DEPARTMENT_OTHER)
Admission: RE | Admit: 2023-04-02 | Discharge: 2023-04-02 | Disposition: A | Discharge status: Home / Self Care | Payer: 59 | Source: Ambulatory Visit | Attending: Student | Admitting: Student

## 2023-04-02 DIAGNOSIS — Z1231 Encounter for screening mammogram for malignant neoplasm of breast: Secondary | ICD-10-CM | POA: Insufficient documentation

## 2023-04-03 ENCOUNTER — Ambulatory Visit (HOSPITAL_COMMUNITY)
Admission: RE | Admit: 2023-04-03 | Discharge: 2023-04-03 | Disposition: A | Discharge status: Home / Self Care | Payer: 59 | Source: Ambulatory Visit | Attending: Family Medicine | Admitting: Family Medicine

## 2023-04-03 DIAGNOSIS — M25561 Pain in right knee: Secondary | ICD-10-CM

## 2023-04-15 ENCOUNTER — Ambulatory Visit (INDEPENDENT_AMBULATORY_CARE_PROVIDER_SITE_OTHER): Payer: 59 | Admitting: Sports Medicine

## 2023-04-15 VITALS — BP 108/70 | Ht 69.0 in | Wt 160.0 lb

## 2023-04-15 DIAGNOSIS — M1711 Unilateral primary osteoarthritis, right knee: Secondary | ICD-10-CM | POA: Diagnosis not present

## 2023-04-15 MED ORDER — METHYLPREDNISOLONE ACETATE 40 MG/ML IJ SUSP
40.0000 mg | Freq: Once | INTRAMUSCULAR | Status: AC
  Administered 2023-04-15: 40 mg via INTRA_ARTICULAR

## 2023-04-15 NOTE — Progress Notes (Unsigned)
  Gail Gonzalez - 62 y.o. female MRN [id number]  Date of birth: August 31, 1961    CHIEF COMPLAINT:   follow up MRI    SUBJECTIVE:   HPI:  Pleasant 62 year old female here to follow-up MRI of right knee.  Her MRI showed tricompartmental arthritis and maceration of the anterior horn the lateral meniscus.  ROS:     See HPI  PERTINENT  PMH / PSH FH / / SH:  Past Medical, Surgical, Social, and Family History Reviewed & Updated in the EMR.  Pertinent findings include:  none  OBJECTIVE: There were no vitals taken for this visit.  Physical Exam:  Vital signs are reviewed.  GEN: Alert and oriented, NAD Pulm: Breathing unlabored PSY: normal mood, congruent affect  MSK: R Knee - no obvious deformity.  No overlying skin changes.  Mild tender palpation medial joint line, and lateral joint line.  Range of motion of the knee is full.  Neurovascular intact distally  ASSESSMENT & PLAN:  1.  Osteoarthritis of the right knee with degenerative lateral meniscal tearing  -Discussed the MRI with the patient in great detail today and showed her the pictures as well.  I think most of her pain is coming from arthritis more so than her degenerative meniscal tearing.  She cannot do NSAIDs due to history of stomach ulcer.  I offered her intra-articular cortisone shot today and she like to proceed with that.  Work note given as well to minimize going up and down stairs for the next 2 weeks until cortisone is taking maximal effect.  She can follow-up as needed and repeat injections every 3 months if desired.  Procedure performed:  Right Knee Intraarticular Corticosteroid Injection; palpation guided  Consent obtained and verified. Time-out conducted. Noted no overlying erythema, induration, or other signs of local infection. The right anterior lateral joint space was palpated and marked. The overlying skin was prepped in a sterile fashion. Topical analgesic spray: Ethyl chloride. Needle: 22 gauge, 1.5  inch Meds: 40 mg methylrprednisolone, 2 ml 1% lidocaine without epinephrine Completed without difficulty  Advised to call if fevers/chills, erythema, induration, drainage, or persistent bleeding.     Arvella Nigh, MD PGY-4, Sports Medicine Fellow [facility]  Addendum:  I was the preceptor for this visit and available for immediate consultation.  Norton Blizzard MD Marrianne Mood

## 2023-05-20 ENCOUNTER — Other Ambulatory Visit: Payer: Self-pay | Admitting: Student

## 2023-06-11 ENCOUNTER — Telehealth: Payer: Self-pay

## 2023-06-11 NOTE — Telephone Encounter (Signed)
Pt calling for referral to chiropractor for her back. I told her I didn't think she needed a referral but I would send the Dr a message. Please advise. Sunday Spillers, CMA

## 2023-06-18 ENCOUNTER — Encounter: Payer: Self-pay | Admitting: Student

## 2023-06-18 ENCOUNTER — Ambulatory Visit (INDEPENDENT_AMBULATORY_CARE_PROVIDER_SITE_OTHER): Payer: 59 | Admitting: Student

## 2023-06-18 VITALS — BP 103/66 | HR 84 | Ht 69.0 in | Wt 159.5 lb

## 2023-06-18 DIAGNOSIS — M25562 Pain in left knee: Secondary | ICD-10-CM

## 2023-06-18 DIAGNOSIS — J3489 Other specified disorders of nose and nasal sinuses: Secondary | ICD-10-CM | POA: Diagnosis not present

## 2023-06-18 DIAGNOSIS — G8929 Other chronic pain: Secondary | ICD-10-CM | POA: Diagnosis not present

## 2023-06-18 MED ORDER — CETIRIZINE HCL 10 MG PO TABS: 10.0000 mg | ORAL_TABLET | Freq: Every day | ORAL | 11 refills | Status: DC | PRN

## 2023-06-18 MED ORDER — FLUTICASONE PROPIONATE 50 MCG/ACT NA SUSP: 2.0000 | Freq: Every day | NASAL | 6 refills | Status: DC

## 2023-06-18 NOTE — Assessment & Plan Note (Signed)
Patient notes a remote history of knee pain, where they recommended MRI, but she did not give. Patient notes that in the last couple of months, her knee has been aching and sometimes buckling while walking. She denies any falls. Patient's knee exam was benign with no pain, weakness, or decreased ROM. I am unsure what is causing her knee pain, but low concern for ligament/meniscus tear, given exam. No history of trauma. Patient's knee appears okay,  but will obtain DG to investigate further. Also will refer to PT to see about strengthening/stability exercises. Patient could be suffering from OA, given age and history.  -DG knee 4 view -Amb Ref PT

## 2023-06-18 NOTE — Progress Notes (Signed)
SUBJECTIVE:   CHIEF COMPLAINT / HPI:   Left Knee Pain Has been bothering her for 2 months. Years ago she had swelling behind the knee, thought to be baker's cyst, attempted aspiration w/ needle > unsuccessful > need's MRI. Has been noticing knee buckling while walking, no falls. Sometimes the knee swells, but she denies any fevers or systemic symptoms. Denies any trauma, but feels that her knee is overworked. Pop's sometimes, and hurts when it does. Otherwise has a dull ache to the knee, treated with muscle relaxer and or tylenol.   Nasal Drainage Has been happening for 2 weeks, noticing more runny nose in early part of day. Is not all day long, just worse in morning and afternoon. Takes nothing for it. Is taking nothing for it, and denies any fever's/systemic symptoms but has been having nausea. Also appreciates a slight headache. Also appreciates post nasal drip.   PERTINENT  PMH / PSH:   Patient Care Team: Bess Kinds, MD as PCP - General (Family Medicine) OBJECTIVE:  BP 103/66   Pulse 84   Ht 5\' 9"  (1.753 m)   Wt 159 lb 8 oz (72.3 kg)   SpO2 98%   BMI 23.55 kg/m  Physical Exam Constitutional:      General: She is not in acute distress.    Appearance: Normal appearance. She is well-developed. She is not ill-appearing or toxic-appearing.  HENT:     Head: Normocephalic.  Musculoskeletal:     Right knee: Normal.     Left knee: No swelling, deformity, effusion, erythema, ecchymosis, lacerations, bony tenderness or crepitus. Normal range of motion. No tenderness. No LCL laxity, MCL laxity, ACL laxity or PCL laxity.    Instability Tests: Anterior drawer test negative. Posterior drawer test negative. Medial McMurray test negative and lateral McMurray test negative.  Neurological:     Mental Status: She is alert.  Psychiatric:        Behavior: Behavior is cooperative.   Face: No TTP or pressure overlying facial sinuses  ASSESSMENT/PLAN:  Chronic pain of left knee Assessment  & Plan: Patient notes a remote history of knee pain, where they recommended MRI, but she did not give. Patient notes that in the last couple of months, her knee has been aching and sometimes buckling while walking. She denies any falls. Patient's knee exam was benign with no pain, weakness, or decreased ROM. I am unsure what is causing her knee pain, but low concern for ligament/meniscus tear, given exam. No history of trauma. Patient's knee appears okay,  but will obtain DG to investigate further. Also will refer to PT to see about strengthening/stability exercises. Patient could be suffering from OA, given age and history.  -DG knee 4 view -Amb Ref PT  Orders: -     DG Knee Complete 4 Views Left; Future -     Ambulatory referral to Physical Therapy  Rhinorrhea Assessment & Plan: Patient note's that she's had rhinorrhea for last 2 weeks. She denies any fever's or systemic symptoms, but notes she has some postnasal drip. Patient takes nothing for allergies and reports no history of allergies. Patient reports that symptoms are worse in the AM and subside by the evening. Giving symptoms low concern for sinus infection, patient's presentation most concerning for allergies. Will trial allergy meds. Return precautions given for fever, HA, facial pain.  -Zyrtec 10 mg -Flonase 2 spray each nostril -F/u prn   Other orders -     Cetirizine HCl; Take 1 tablet (10 mg total)  by mouth daily as needed for allergies.  Dispense: 30 tablet; Refill: 11 -     Fluticasone Propionate; Place 2 sprays into both nostrils daily.  Dispense: 16 g; Refill: 6   No follow-ups on file. Bess Kinds, MD 06/18/2023, 2:45 PM PGY-3, [facility]

## 2023-06-18 NOTE — Assessment & Plan Note (Signed)
Patient note's that she's had rhinorrhea for last 2 weeks. She denies any fever's or systemic symptoms, but notes she has some postnasal drip. Patient takes nothing for allergies and reports no history of allergies. Patient reports that symptoms are worse in the AM and subside by the evening. Giving symptoms low concern for sinus infection, patient's presentation most concerning for allergies. Will trial allergy meds. Return precautions given for fever, HA, facial pain.  -Zyrtec 10 mg -Flonase 2 spray each nostril -F/u prn

## 2023-06-18 NOTE — Patient Instructions (Signed)
It was great to see you! Thank you for allowing me to participate in your care!  I recommend that you always bring your medications to each appointment as this makes it easy to ensure we are on the correct medications and helps [place] not miss when refills are needed.  Our plans for today:  - Knee Pain  X-Ray of left knee   [facility] [address] [place]   Referral to Physical Therapy  Pain Relief   Use Voltaren Gel or Lidocaine Patches   Runny Nose  Sounds like allergies, we will try some allergy meds  Zyrtec 10 mg daily  Flonase 2 spray each nostril daily    Take care and seek immediate care sooner if you develop any concerns.   Dr. Bess Kinds, MD [facility]

## 2023-06-19 ENCOUNTER — Ambulatory Visit
Admission: RE | Admit: 2023-06-19 | Discharge: 2023-06-19 | Disposition: A | Discharge status: Home / Self Care | Payer: 59 | Source: Ambulatory Visit | Attending: Family Medicine | Admitting: Family Medicine

## 2023-06-19 DIAGNOSIS — G8929 Other chronic pain: Secondary | ICD-10-CM

## 2023-06-27 ENCOUNTER — Encounter: Payer: Self-pay | Admitting: Student

## 2023-07-08 DIAGNOSIS — H9313 Tinnitus, bilateral: Secondary | ICD-10-CM | POA: Diagnosis not present

## 2023-07-08 DIAGNOSIS — H6123 Impacted cerumen, bilateral: Secondary | ICD-10-CM | POA: Diagnosis not present

## 2023-07-08 DIAGNOSIS — H93293 Other abnormal auditory perceptions, bilateral: Secondary | ICD-10-CM | POA: Diagnosis not present

## 2023-07-08 DIAGNOSIS — R42 Dizziness and giddiness: Secondary | ICD-10-CM | POA: Diagnosis not present

## 2023-07-18 ENCOUNTER — Other Ambulatory Visit: Payer: Self-pay | Admitting: Student

## 2023-08-21 ENCOUNTER — Other Ambulatory Visit: Payer: Self-pay | Admitting: Student

## 2023-08-25 DIAGNOSIS — H9313 Tinnitus, bilateral: Secondary | ICD-10-CM | POA: Diagnosis not present

## 2023-08-25 MED ORDER — ATORVASTATIN CALCIUM 10 MG PO TABS: 10.0000 mg | ORAL_TABLET | Freq: Every day | ORAL | 1 refills | Status: DC

## 2023-10-12 ENCOUNTER — Ambulatory Visit: Payer: Medicaid Other | Admitting: Student

## 2023-10-12 ENCOUNTER — Encounter: Payer: Self-pay | Admitting: Student

## 2023-10-12 VITALS — BP 129/63 | HR 65 | Ht 69.0 in | Wt 162.8 lb

## 2023-10-12 DIAGNOSIS — G8929 Other chronic pain: Secondary | ICD-10-CM | POA: Diagnosis not present

## 2023-10-12 DIAGNOSIS — Z23 Encounter for immunization: Secondary | ICD-10-CM

## 2023-10-12 DIAGNOSIS — N951 Menopausal and female climacteric states: Secondary | ICD-10-CM | POA: Diagnosis not present

## 2023-10-12 DIAGNOSIS — M25562 Pain in left knee: Secondary | ICD-10-CM | POA: Diagnosis not present

## 2023-10-12 MED ORDER — ESCITALOPRAM OXALATE 20 MG PO TABS: ORAL_TABLET | ORAL | 1 refills | Status: DC

## 2023-10-12 NOTE — Assessment & Plan Note (Addendum)
Patient continues to appreciate pain in her left knee, and appreciates that it has been giving out on her at times. She wears a brace to help support it, and feels this helps a lot. Knee exam had slightly decreased ROM with extension, and positive medial knee pain with McMurray's test. I have some concern for meniscal injury, as patient had knee x-ray that was normal previously, making OA less likely. Will recommend patient follow up with Sport's medicine, for evaluation under ultra-sound, consideration of MRI vs knee injection vs rehab. Patient declines PT today.  -F/u with Sport's medicine

## 2023-10-12 NOTE — Progress Notes (Signed)
  SUBJECTIVE:   CHIEF COMPLAINT / HPI:   Discuss Menopause  Happening every day, 5-6 times a day, lasting a few minutes. Get's hot, sweaty and is wanting medication she used in the past when she was premenopausal. Had hx of endometriosis. Notes she was on medication when she was in her 87's, and perimenopausal. Patient appreciates symptoms most frequently happen at night, but are sporadic and can happen any time during the day.    Left Knee Having knee pain and feels like knee is giving out on her more. Is having to wear her brace constantly to support her knee and prevent slippage. Also appreciate that it swells. No history of trauma or damage.    PERTINENT  PMH / PSH:    OBJECTIVE:  There were no vitals taken for this visit. Physical Exam Constitutional:      General: She is not in acute distress.    Appearance: Normal appearance. She is not ill-appearing.  Musculoskeletal:     Left knee: No swelling, deformity, effusion, erythema, ecchymosis or lacerations. Decreased range of motion. No tenderness. No LCL laxity, MCL laxity, ACL laxity or PCL laxity.    Instability Tests: Anterior drawer test negative. Posterior drawer test negative. Medial McMurray test positive.  Neurological:     Mental Status: She is alert.      ASSESSMENT/PLAN:   Assessment & Plan Chronic pain of left knee Patient continues to appreciate pain in her left knee, and appreciates that it has been giving out on her at times. She wears a brace to help support it, and feels this helps a lot. Knee exam had slightly decreased ROM with extension, and positive medial knee pain with McMurray's test. I have some concern for meniscal injury, as patient had knee x-ray that was normal previously, making OA less likely. Will recommend patient follow up with Sport's medicine, for evaluation under ultra-sound, consideration of MRI vs knee injection vs rehab. Patient declines PT today.  -F/u with Sport's medicine Hot flashes,  menopausal Patient appreciates she's been struggling with hot flashes for years. She notes they come on sporadically and last a few minutes, but notices them mostly at night. She note's she was on a medication for this in the past, but that was when she was in her 19's, and she is unsure of the name. Patient wanting something to help with the hot flashes and irritability. Patient continues to take lexapro, discussed increasing dose of lexapro, to attempt to treat symptoms, and then consider hormone/not hormonal Tx if not improved.  -Lexapro 20 mg daily - F/u 2-4 weeks / call provider with updated - Will consider hormonal/non-hormonal therapies if indicated.  No follow-ups on file. Bess Kinds, MD 10/12/2023, 9:56 AM PGY-3, [facility]

## 2023-10-12 NOTE — Patient Instructions (Addendum)
It was great to see you! Thank you for allowing me to participate in your care!  I recommend that you always bring your medications to each appointment as this makes it easy to ensure we are on the correct medications and helps [place] not miss when refills are needed.  Our plans for today:  - Menopause Symptoms  We are going to try increasing your Lexapro to 20 mg  Try for 2-4 weeks and let me know if things get better  If not working, we will consider other medications that may help!  - Knee Pain Follow up with [facility], they will evaluate for need of MRI, offer exercises that may help, and may be able to image it with ultrasound / offer a knee injection  [facility] [address] [place], [place], [place] [address]   [phone]  Take care and seek immediate care sooner if you develop any concerns.   Dr. Bess Kinds, MD [facility]

## 2023-10-12 NOTE — Assessment & Plan Note (Addendum)
Patient appreciates she's been struggling with hot flashes for years. She notes they come on sporadically and last a few minutes, but notices them mostly at night. She note's she was on a medication for this in the past, but that was when she was in her 17's, and she is unsure of the name. Patient wanting something to help with the hot flashes and irritability. Patient continues to take lexapro, discussed increasing dose of lexapro, to attempt to treat symptoms, and then consider hormone/not hormonal Tx if not improved.  -Lexapro 20 mg daily - F/u 2-4 weeks / call provider with updated - Will consider hormonal/non-hormonal therapies if indicated.

## 2023-10-23 ENCOUNTER — Other Ambulatory Visit: Payer: Self-pay | Admitting: Student

## 2023-10-26 ENCOUNTER — Ambulatory Visit: Payer: Medicaid Other | Admitting: Family Medicine

## 2023-10-26 VITALS — BP 104/68 | Ht 68.0 in | Wt 162.0 lb

## 2023-10-26 DIAGNOSIS — M25562 Pain in left knee: Secondary | ICD-10-CM | POA: Diagnosis not present

## 2023-10-26 MED ORDER — MELOXICAM 15 MG PO TABS: 15.0000 mg | ORAL_TABLET | Freq: Every day | ORAL | 2 refills | Status: DC

## 2023-10-26 NOTE — Patient Instructions (Addendum)
We will go ahead with an MRI to assess for a bucket handle or flap meniscus tear. Continue wearing the sleeve. Take meloxicam 15mg  daily with food for pain and inflammation.  Dont take aleve or ibuprofen with this. Do home exercises daily. I will call you with results and next steps.

## 2023-10-27 ENCOUNTER — Telehealth: Payer: Self-pay | Admitting: Student

## 2023-10-27 ENCOUNTER — Encounter: Payer: Self-pay | Admitting: Family Medicine

## 2023-10-27 NOTE — Telephone Encounter (Signed)
**  After Hours/ Emergency Line Call**  Received a page to call [phone]) - [phone].  Patient: Cailin Gebel  Caller: Self  Confirmed name & DOB of patient with caller  Subjective:  Calling because her friend is a phlebotomist and checked her cholesterol, noting it was in the 300's. I discussed that her last cholesterol was normal in march and she is on Lipitor 10 mg. She discussed that she would send me the result, and we'd consider rechecking her cholesterol pending on the results. If cholesterol remains truly elevated will likely increase Lipitor dose. Patient also noted that her HR was been recorded around 47, but she was feeling fine.   Objective:  Observations: NAD    Assessment & Plan  Debie Ashline is a 62 y.o. female with PMHx s/f HLD who calls with the following complaints of elevated cholesterol. Plan to received lab/consider rechecking in near future. Will adjust Lipitor dose as needed. Provided reassurance that HR that low is normal in healthy people who are active, and is not a concern unless she is having symptoms. Patient felt reassured and was appreciative.   Recommendations:  Send me results, possibly recheck in future   Penne Rhein, MD [facility], PGY-1

## 2023-10-30 ENCOUNTER — Ambulatory Visit
Admission: RE | Admit: 2023-10-30 | Discharge: 2023-10-30 | Disposition: A | Discharge status: Home / Self Care | Payer: Medicaid Other | Source: Ambulatory Visit | Attending: Family Medicine | Admitting: Family Medicine

## 2023-10-30 DIAGNOSIS — M25562 Pain in left knee: Secondary | ICD-10-CM | POA: Diagnosis not present

## 2023-10-30 DIAGNOSIS — M23342 Other meniscus derangements, anterior horn of lateral meniscus, left knee: Secondary | ICD-10-CM | POA: Diagnosis not present

## 2023-11-07 ENCOUNTER — Other Ambulatory Visit: Payer: Self-pay | Admitting: Student

## 2023-11-12 ENCOUNTER — Other Ambulatory Visit: Payer: Self-pay

## 2023-11-12 DIAGNOSIS — M25562 Pain in left knee: Secondary | ICD-10-CM

## 2023-11-21 ENCOUNTER — Other Ambulatory Visit: Payer: Self-pay | Admitting: Student

## 2023-11-24 DIAGNOSIS — S83282A Other tear of lateral meniscus, current injury, left knee, initial encounter: Secondary | ICD-10-CM | POA: Diagnosis not present

## 2024-01-18 DIAGNOSIS — M23062 Cystic meniscus, other lateral meniscus, left knee: Secondary | ICD-10-CM | POA: Diagnosis not present

## 2024-01-18 DIAGNOSIS — S83282A Other tear of lateral meniscus, current injury, left knee, initial encounter: Secondary | ICD-10-CM | POA: Diagnosis not present

## 2024-01-18 DIAGNOSIS — S83272A Complex tear of lateral meniscus, current injury, left knee, initial encounter: Secondary | ICD-10-CM | POA: Diagnosis not present

## 2024-01-18 DIAGNOSIS — X58XXXA Exposure to other specified factors, initial encounter: Secondary | ICD-10-CM | POA: Diagnosis not present

## 2024-01-18 DIAGNOSIS — Y999 Unspecified external cause status: Secondary | ICD-10-CM | POA: Diagnosis not present

## 2024-01-20 NOTE — Therapy (Signed)
 OUTPATIENT PHYSICAL THERAPY LOWER EXTREMITY EVALUATION   Patient Name: Gail Gonzalez MRN: [id number] DOB:July 28, 1961, 63 y.o., female Today's Date: 01/22/2024  END OF SESSION:  PT End of Session - 01/21/24 1431     Visit Number 1    Number of Visits 17    Date for PT Re-Evaluation 03/25/24    Authorization Type [employer]    Authorization - Visit Number 1    Authorization - Number of Visits 27    PT Start Time 1416    PT Stop Time 1500    PT Time Calculation (min) 44 min    Activity Tolerance Patient tolerated treatment well    Behavior During Therapy [facility] for tasks assessed/performed             Past Medical History:  Diagnosis Date   Anemia    Anxiety    Arthritis    Cardiac arrhythmia due to congenital heart disease    Cataract    Chronic back pain    Colon polyps 04/24/2013   Diverticulosis 02/24/2013   Endometriosis    on lap surgery. seen by Dr. Su Hilt   Fatty liver    GERD (gastroesophageal reflux disease)    H. pylori infection    Heart murmur    Hemorrhoids    Internal hemorrhoids    Rectal bleeding    Thyroid disease 10/27/2010   nodule   Tubular adenoma of colon    Past Surgical History:  Procedure Laterality Date   BIOPSY THYROID  2013   BREAST BIOPSY Left    DIAGNOSTIC LAPAROSCOPY  1990   endometriosis   Patient Active Problem List   Diagnosis Date Noted   Hot flashes, menopausal 10/12/2023   Chronic pain of left knee 06/18/2023   Rhinorrhea 06/18/2023   Pain of right lower extremity 01/06/2023   Fatigue 11/25/2022   Grief counseling 02/02/2022   Anxiety disorder 08/18/2020   Uterine leiomyoma 11/18/2017   Preventative health care 10/11/2016   Tobacco use disorder 10/10/2016   Dyslipidemia 10/10/2016   Hyperglycemia 10/10/2016   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  SUBJECTIVE:   SUBJECTIVE STATEMENT: Pt reports: L knee is feeling some better since surgery. A little pooping is occurring. She is using cold packs periodically duirng the day for pain and swelling. Has a primarily a desk type job and and is planning to return to work next week  PERTINENT HISTORY: See PMH  PAIN:  Are you having pain? Yes: NPRS scale: 7/10 Pain location: L knee Pain description: Ache Aggravating factors: Bending the knee Relieving factors: Pain medication, cold pack  PRECAUTIONS: Chronic back pain  RED FLAGS: None   WEIGHT BEARING RESTRICTIONS: No WBAT  FALLS:  Has patient fallen in last 6 months? 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 Eventually being able to dance.  NEXT MD VISIT: To call and schedule a post surgical appt c Dr Everardo Pacific  OBJECTIVE:  Note: Objective measures were completed at Evaluation unless otherwise noted.  DIAGNOSTIC FINDINGS:  10/30/23 MRI L knee IMPRESSION: 1. Complex tear of the anterior horn of the lateral meniscus and peripheral meniscal extrusion. 14 mm cystic mass anterior to the anterior horn lateral meniscus likely reflecting a parameniscal cyst. 2. High-grade partial-thickness cartilage loss of the  medial patellar facet and patellar apex with mild subchondral marrow edema in the patellar apex. 3. Mild partial-thickness cartilage loss of the weight-bearing lateral femorotibial compartment.  PATIENT SURVEYS:  LEFS 15/80 = 19% or 81% limitation  COGNITION: Overall cognitive status: Within functional limits for tasks assessed     SENSATION: WFL  EDEMA:  Present- covered by post surgical dressing  MUSCLE LENGTH: Hamstrings: Right NT deg; Left NT deg Maisie Fus test: Right NT deg; Left NT deg  POSTURE: rounded shoulders and forward head  PALPATION: TTP peri L knee. Wrapped with ace bandage and dressing in place  LOWER EXTREMITY ROM:  Active ROM Right eval Left eval  Hip flexion    Hip extension    Hip abduction    Hip adduction    Hip internal rotation    Hip external rotation    Knee flexion  90  Knee extension  4 lacking  Ankle dorsiflexion    Ankle plantarflexion    Ankle inversion    Ankle eversion     (Blank rows = not tested)  LOWER EXTREMITY MMT:  MMT Right eval Left eval  Hip flexion 5 4  Hip extension    Hip abduction 5 4  Hip adduction    Hip internal rotation    Hip external rotation 5 4  Knee flexion 5 NT  Knee extension 5 NT  Ankle dorsiflexion    Ankle plantarflexion    Ankle inversion    Ankle eversion     (Blank rows = not tested)  LOWER EXTREMITY SPECIAL TESTS:  NT  FUNCTIONAL TESTS: Assess when pt is able to walk without an assist device 5 times sit to stand: TBA 2 minute walk test: TBA  GAIT: Distance walked: 200' Assistive device utilized: Crutches Level of assistance: Modified independence Comments: Swing through pattern                                                                                                                   TREATMENT DATE:  [facility] Adult PT Treatment:                                                DATE: 01/21/24 Therapeutic Exercise: Developed, instructed in, and pt completed therex as noted in HEP   Self  Care: RICE for management of pain and swelling 10-15 mins periodically    PATIENT EDUCATION:  Education details: Eval findings, POC, HEP, self care  Person educated: Patient Education method: Explanation, Demonstration, Tactile cues, Verbal cues, and Handouts Education comprehension: verbalized understanding, returned demonstration, verbal cues required, and tactile cues required  HOME EXERCISE PROGRAM: Access Code: [email or web address] URL: [email or web address] Date: 01/21/2024 Prepared by: Joellyn Rued  Exercises - Supine Heel Slide  - 3 x daily - 7 x weekly - 10 reps - 5 hold - Active Straight Leg Raise with Quad Set  - 3 x daily - 7 x weekly - 1 sets - 10 reps - 5 hold - Supine Single Leg Ankle Pumps  - 3 x daily - 7 x weekly - 1 sets - 10 reps - Supine Ankle Circles  - 3 x daily - 7 x weekly - 1 sets - 10 reps - Sidelying Hip Abduction  - 3 x daily - 7 x weekly - 1 sets - 10 reps  ASSESSMENT:  CLINICAL IMPRESSION: Patient is a 63 y.o. female who was seen today for physical therapy evaluation and treatment for Left knee arthroscopy with lateral meniscectomy 01/18/24 .   OBJECTIVE IMPAIRMENTS: Abnormal gait, decreased activity tolerance, difficulty walking, decreased ROM, decreased strength, increased edema, and pain.   ACTIVITY LIMITATIONS: carrying, lifting, bending, sitting, standing, squatting, sleeping, stairs, transfers, bed mobility, bathing, toileting, dressing, locomotion level, and caring for others  PARTICIPATION LIMITATIONS: meal prep, cleaning, laundry, driving, shopping, community activity, and occupation  PERSONAL FACTORS: Past/current experiences and Time since onset of injury/illness/exacerbation Chronic back pain, are also affecting patient's functional outcome.   REHAB POTENTIAL: Good  CLINICAL DECISION MAKING: Evolving/moderate complexity  EVALUATION COMPLEXITY: Moderate   GOALS:  SHORT TERM GOALS: Target date: 02/12/24 Pt will be Ind in an  initial HEP  Baseline: started Goal status: INITIAL  2.  Pt will voice understanding of measures to assist in pain and swelling reduction  Baseline: started Goal status: INITIAL  3.  L knee AROM will increase to 0-115d for good functional outcome Baseline: 4-90 Goal status: INITIAL  LONG TERM GOALS: Target date: 03/25/24  Pt will be Ind in a final HEP to maintain achieved LOF  Baseline: started Goal status: INITIAL  2.  L LE will demonstrated 5/5 strength for improved function with ADLs Baseline: see flow sheet Goal status: INITIAL  3.  L knee will demonstrate 0-125d of AROM for appropriate function with sitting, ascending/descending steps, ambulation Baseline: 4-90d Goal status: INITIAL  4.  Improve 5xSTS by MCID of 5" and by MCID of 70ft as indication of improved functional mobility  Baseline: TBA Goal status: INITIAL  5.  Pt will be able to ascend and descend 18 steps c HR assist for home and community mobility Baseline: assist by crutches  Goal status: INITIAL  6.  Pt will b able to walk with a normalized gait pattern 1000' for community mobility and to progress pt toward return to dance Baseline: Assist by crutches Goal status: INITIAL   PLAN:  PT FREQUENCY: 2x/week  PT DURATION: 8 weeks  PLANNED INTERVENTIONS: 97164- PT Re-evaluation, 97110-Therapeutic exercises, 97530- Therapeutic activity, [id number]- Neuromuscular re-education, 97535- Self Care, 54098- Manual therapy, [id number]- Gait training, [phone]- Electrical stimulation (unattended), 97016- Vasopneumatic device, Patient/Family education, Balance training, Stair training, Taping, Dry Needling, Joint mobilization, Cryotherapy, and Moist heat  PLAN FOR NEXT SESSION: Assess response to HEP; progress therex as indicated; use of modalities, manual therapy; and TPDN as indicated.  Laurana Magistro MS, PT 01/22/24 6:23 AM

## 2024-01-21 ENCOUNTER — Ambulatory Visit: Attending: Orthopaedic Surgery

## 2024-01-21 ENCOUNTER — Other Ambulatory Visit: Payer: Self-pay

## 2024-01-21 DIAGNOSIS — M5459 Other low back pain: Secondary | ICD-10-CM | POA: Diagnosis not present

## 2024-01-21 DIAGNOSIS — M6281 Muscle weakness (generalized): Secondary | ICD-10-CM | POA: Insufficient documentation

## 2024-01-26 NOTE — Therapy (Unsigned)
 OUTPATIENT PHYSICAL THERAPY LOWER EXTREMITY TREATMENT   Patient Name: Gail Gonzalez MRN: [id number] DOB:[birth year], 63 y.o., female Today's Date: 01/28/2024  END OF SESSION:  PT End of Session - 01/28/24 0854     Visit Number 2    Number of Visits 17    Date for PT Re-Evaluation 03/25/24    Authorization Type [employer]    Authorization - Visit Number 2    Authorization - Number of Visits 27    PT Start Time 0848    PT Stop Time 0936    PT Time Calculation (min) 48 min    Activity Tolerance Patient tolerated treatment well    Behavior During Therapy [facility] for tasks assessed/performed              Past Medical History:  Diagnosis Date   Anemia    Anxiety    Arthritis    Cardiac arrhythmia due to congenital heart disease    Cataract    Chronic back pain    Colon polyps 04/24/2013   Diverticulosis 02/24/2013   Endometriosis    on lap surgery. seen by Dr. Su Hilt   Fatty liver    GERD (gastroesophageal reflux disease)    H. pylori infection    Heart murmur    Hemorrhoids    Internal hemorrhoids    Rectal bleeding    Thyroid disease 10/27/2010   nodule   Tubular adenoma of colon    Past Surgical History:  Procedure Laterality Date   BIOPSY THYROID  2013   BREAST BIOPSY Left    DIAGNOSTIC LAPAROSCOPY  1990   endometriosis   Patient Active Problem List   Diagnosis Date Noted   Hot flashes, menopausal 10/12/2023   Chronic pain of left knee 06/18/2023   Rhinorrhea 06/18/2023   Pain of right lower extremity 01/06/2023   Fatigue 11/25/2022   Grief counseling 02/02/2022   Anxiety disorder 08/18/2020   Uterine leiomyoma 11/18/2017   Preventative health care 10/11/2016   Tobacco use disorder 10/10/2016   Dyslipidemia 10/10/2016   Hyperglycemia 10/10/2016   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   Seborrheic keratosis 03/06/2016   Back pain 03/06/2016   Vitamin D deficiency 06/11/2015   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  PERTINENT HISTORY: See PMH  PAIN:  Are you having pain? Yes: NPRS scale: 3/10 Pain location: L knee Pain description: Ache Aggravating factors: Bending the knee Relieving factors: Pain medication, cold pack  PRECAUTIONS: Chronic back pain  RED FLAGS: None   WEIGHT BEARING RESTRICTIONS: No WBAT  FALLS:  Has patient fallen in last 6 months? 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 Eventually being able to dance.  NEXT MD VISIT: To call and  schedule a post surgical appt c Dr Everardo Pacific  OBJECTIVE:  Note: Objective measures were completed at Evaluation unless otherwise noted.  DIAGNOSTIC FINDINGS:  10/30/23 MRI L knee IMPRESSION: 1. Complex tear of the anterior horn of the lateral meniscus and peripheral meniscal extrusion. 14 mm cystic mass anterior to the anterior horn lateral meniscus likely reflecting a parameniscal cyst. 2. High-grade partial-thickness cartilage loss of the medial patellar facet and patellar apex with mild subchondral marrow edema in the patellar apex. 3. Mild partial-thickness cartilage loss of the weight-bearing lateral femorotibial  compartment.  PATIENT SURVEYS:  LEFS 15/80 = 19% or 81% limitation  COGNITION: Overall cognitive status: Within functional limits for tasks assessed     SENSATION: WFL  EDEMA:  Present- covered by post surgical dressing  MUSCLE LENGTH: Hamstrings: Right NT deg; Left NT deg Maisie Fus test: Right NT deg; Left NT deg  POSTURE: rounded shoulders and forward head  PALPATION: TTP peri L knee. Wrapped with ace bandage and dressing in place  LOWER EXTREMITY ROM:  Active ROM Right eval Left eval LT 01/28/24  Hip flexion     Hip extension     Hip abduction     Hip adduction     Hip internal rotation     Hip external rotation     Knee flexion  90 120  Knee extension  4 lacking 2 lacking  Ankle dorsiflexion     Ankle plantarflexion     Ankle inversion     Ankle eversion      (Blank rows = not tested)  LOWER EXTREMITY MMT:  MMT Right eval Left eval  Hip flexion 5 4  Hip extension    Hip abduction 5 4  Hip adduction    Hip internal rotation    Hip external rotation 5 4  Knee flexion 5 NT  Knee extension 5 NT  Ankle dorsiflexion    Ankle plantarflexion    Ankle inversion    Ankle eversion     (Blank rows = not tested)  LOWER EXTREMITY SPECIAL TESTS:  NT  FUNCTIONAL TESTS: Assess when pt is able to walk without an assist device 5 times sit to stand: TBA 2 minute walk test: TBA  GAIT: Distance walked: 200' Assistive device utilized: Crutches Level of assistance: Modified independence Comments: Swing through pattern                                                                                                                   TREATMENT DATE:  [facility] Adult PT Treatment:                                                DATE: 01/28/24 Therapeutic Exercise: Nusteps 5 mins L3 UE/LE Heel slides x10 Quad sets x10 5" SLR c QS x10  Clam x10 3" GTB Hip add ball squeeze x10 3" SAQ x10 3" Hip abd  x10 3" Modalities: Cold pack to the L knee x10 mins c elevation  [facility]  Adult PT Treatment:                                                DATE: 01/21/24 Therapeutic Exercise: Developed, instructed in, and pt completed therex as noted in HEP  Self Care: RICE for management of pain and swelling 10-15 mins periodically    PATIENT EDUCATION:  Education details: Eval findings, POC, HEP, self care  Person educated: Patient Education method: Explanation, Demonstration, Tactile cues, Verbal cues, and Handouts Education comprehension: verbalized understanding, returned demonstration, verbal cues required, and tactile cues required  HOME EXERCISE PROGRAM: Access Code: [email or web address] URL: [email or web address] Date: 01/28/2024 Prepared by: Joellyn Rued  Exercises - Supine Heel Slide  - 2-3 x daily - 7 x weekly - 10 reps - 5 hold - Supine Quad Set  - 2-3 x daily - 7 x weekly - 1 sets - 10 reps - 5 hold - Active Straight Leg Raise with Quad Set  - 2-3 x daily - 7 x weekly - 1 sets - 10 reps - 5 hold - Sidelying Hip Abduction  - 2-3 x daily - 7 x weekly - 1 sets - 10 reps - 3 hold - Hooklying Clamshell with Resistance  - 2-3 x daily - 7 x weekly - 1 sets - 10 reps - 3 hold - Supine Hip Adduction Isometric with Ball  - 2-3 x daily - 7 x weekly - 1 sets - 10 reps - 3 hold - Supine Knee Extension Strengthening  - 2-3 x daily - 7 x weekly - 1 sets - 10 reps - 3 hold - Supine Single Leg Ankle Pumps  - 2 x daily - 7 x weekly - 1 sets - 10 reps - Supine Ankle Circles  - 2 x daily - 7 x weekly - 1 sets - 10 reps  ASSESSMENT:  CLINICAL IMPRESSION: Pt presents to PT walking without uses of crutches and minimal antalgc gait patter. PT was completed for L LE strength and ROM. Pt was able to complete a SLR without lag and knee flexion has made good gains to 120d. Pt returned demonstration of therex. HEP was updated. Pt tolerated prescribed therex without adverse effects. Pt will continue to benefit from skilled PT to address impairments for improved L LE function.     OBJECTIVE IMPAIRMENTS: Abnormal gait, decreased activity tolerance, difficulty walking, decreased ROM, decreased strength, increased edema, and pain.   ACTIVITY LIMITATIONS: carrying, lifting, bending, sitting, standing, squatting, sleeping, stairs, transfers, bed mobility, bathing, toileting, dressing, locomotion level, and caring for others  PARTICIPATION LIMITATIONS: meal prep, cleaning, laundry, driving, shopping, community activity, and occupation  PERSONAL FACTORS: Past/current experiences and Time since onset of injury/illness/exacerbation Chronic back pain, are also affecting patient's functional outcome.   REHAB POTENTIAL: Good  CLINICAL DECISION MAKING: Evolving/moderate complexity  EVALUATION COMPLEXITY: Moderate   GOALS:  SHORT TERM GOALS: Target date: 02/12/24 Pt will be Ind in an initial HEP  Baseline: started Goal status: ONGOING  2.  Pt will voice understanding of measures to assist in pain and swelling reduction  Baseline: started Goal status: ONGOING  3.  L knee AROM will increase to 0-115d for good functional outcome Baseline: 4-90 Goal status: ONGOING  LONG TERM GOALS: Target date: 03/25/24  Pt will be Ind in  a final HEP to maintain achieved LOF  Baseline: started Goal status: INITIAL  2.  L LE will demonstrated 5/5 strength for improved function with ADLs Baseline: see flow sheet Goal status: INITIAL  3.  L knee will demonstrate 0-125d of AROM for appropriate function with sitting, ascending/descending steps, ambulation Baseline: 4-90d Goal status: INITIAL  4.  Improve 5xSTS by MCID of 5" and by MCID of 67ft as indication of improved functional mobility  Baseline: TBA Goal status: INITIAL  5.  Pt will be able to ascend and descend 18 steps c HR assist for home and community mobility Baseline: assist by crutches  Goal status: INITIAL  6.  Pt will b able to walk with a normalized gait pattern 1000' for community mobility and to progress pt  toward return to dance Baseline: Assist by crutches Goal status: INITIAL   PLAN:  PT FREQUENCY: 2x/week  PT DURATION: 8 weeks  PLANNED INTERVENTIONS: 97164- PT Re-evaluation, 97110-Therapeutic exercises, 97530- Therapeutic activity, [id number]- Neuromuscular re-education, 97535- Self Care, 40981- Manual therapy, [id number]- Gait training, [phone]- Electrical stimulation (unattended), 97016- Vasopneumatic device, Patient/Family education, Balance training, Stair training, Taping, Dry Needling, Joint mobilization, Cryotherapy, and Moist heat  PLAN FOR NEXT SESSION: Assess response to HEP; progress therex as indicated; use of modalities, manual therapy; and TPDN as indicated.   Cait Locust MS, PT 01/28/24 12:59 PM

## 2024-01-28 ENCOUNTER — Ambulatory Visit: Attending: Orthopaedic Surgery

## 2024-01-28 DIAGNOSIS — M6281 Muscle weakness (generalized): Secondary | ICD-10-CM | POA: Insufficient documentation

## 2024-01-28 DIAGNOSIS — M5459 Other low back pain: Secondary | ICD-10-CM | POA: Insufficient documentation

## 2024-01-30 ENCOUNTER — Other Ambulatory Visit: Payer: Self-pay | Admitting: Family Medicine

## 2024-02-02 ENCOUNTER — Ambulatory Visit: Admitting: Physical Therapy

## 2024-02-02 ENCOUNTER — Encounter: Payer: Self-pay | Admitting: Physical Therapy

## 2024-02-02 DIAGNOSIS — M6281 Muscle weakness (generalized): Secondary | ICD-10-CM | POA: Diagnosis not present

## 2024-02-02 DIAGNOSIS — M5459 Other low back pain: Secondary | ICD-10-CM

## 2024-02-02 NOTE — Therapy (Signed)
 OUTPATIENT PHYSICAL THERAPY LOWER EXTREMITY TREATMENT   Patient Name: Gail Gonzalez MRN: [id number] DOB:[birth year], 63 y.o., female Today's Date: 02/02/2024  END OF SESSION:  PT End of Session - 02/02/24 0935     Visit Number 3    Number of Visits 17    Date for PT Re-Evaluation 03/25/24    Authorization Type [employer]    Authorization - Visit Number 3    Authorization - Number of Visits 27    PT Start Time 0935    PT Stop Time 1015    PT Time Calculation (min) 40 min    Activity Tolerance Patient tolerated treatment well    Behavior During Therapy [facility] for tasks assessed/performed               Past Medical History:  Diagnosis Date   Anemia    Anxiety    Arthritis    Cardiac arrhythmia due to congenital heart disease    Cataract    Chronic back pain    Colon polyps 04/24/2013   Diverticulosis 02/24/2013   Endometriosis    on lap surgery. seen by Dr. Su Hilt   Fatty liver    GERD (gastroesophageal reflux disease)    H. pylori infection    Heart murmur    Hemorrhoids    Internal hemorrhoids    Rectal bleeding    Thyroid disease 10/27/2010   nodule   Tubular adenoma of colon    Past Surgical History:  Procedure Laterality Date   BIOPSY THYROID  2013   BREAST BIOPSY Left    DIAGNOSTIC LAPAROSCOPY  1990   endometriosis   Patient Active Problem List   Diagnosis Date Noted   Hot flashes, menopausal 10/12/2023   Chronic pain of left knee 06/18/2023   Rhinorrhea 06/18/2023   Pain of right lower extremity 01/06/2023   Fatigue 11/25/2022   Grief counseling 02/02/2022   Anxiety disorder 08/18/2020   Uterine leiomyoma 11/18/2017   Preventative health care 10/11/2016   Tobacco use disorder 10/10/2016   Dyslipidemia 10/10/2016   Hyperglycemia 10/10/2016   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   Seborrheic keratosis 03/06/2016   Back pain 03/06/2016   Vitamin D deficiency 06/11/2015   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   Baker's cyst, unruptured  12/30/2012   GERD (gastroesophageal reflux disease) 07/29/2012   Endometriosis 07/29/2012   Thyroid nodule 07/29/2012   S/P left breast biopsy 07/29/2012    PCP: Bess Kinds, MD   REFERRING PROVIDER: Bjorn Pippin, MD   REFERRING DIAG: Left knee arthroscopy with lateral meniscectomy 01/18/24   THERAPY DIAG:  Other low back pain  Muscle weakness  Rationale for Evaluation and Treatment: Rehabilitation  ONSET DATE: 01/18/24  SUBJECTIVE:   SUBJECTIVE STATEMENT: Walking is getting better.  A little pain, 4/10.  Was a little swollen yesterday.   PERTINENT HISTORY: See PMH  PAIN:  Are you having pain? Yes: NPRS scale: 3/10 Pain location: L knee Pain description: Ache Aggravating factors: Bending the knee Relieving factors: Pain medication, cold pack  PRECAUTIONS: Chronic back pain  RED FLAGS: None   WEIGHT BEARING RESTRICTIONS: No WBAT  FALLS:  Has patient fallen in last 6 months? 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 Eventually being able to dance.  NEXT MD VISIT: To call and schedule a post surgical appt c Dr Everardo Pacific  OBJECTIVE:  Note: Objective measures were completed at Evaluation unless otherwise noted.  DIAGNOSTIC FINDINGS:  10/30/23 MRI L knee IMPRESSION: 1. Complex tear of the anterior horn of the lateral meniscus and peripheral meniscal extrusion. 14 mm cystic mass anterior to the anterior horn lateral meniscus likely reflecting a parameniscal cyst. 2. High-grade partial-thickness cartilage loss of the medial patellar facet and patellar apex with mild subchondral marrow edema in the patellar apex. 3. Mild partial-thickness cartilage loss of the  weight-bearing lateral femorotibial compartment.  PATIENT SURVEYS:  LEFS 15/80 = 19% or 81% limitation  COGNITION: Overall cognitive status: Within functional limits for tasks assessed     SENSATION: WFL  EDEMA:  Present- covered by post surgical dressing  MUSCLE LENGTH: Hamstrings: Right NT deg; Left NT deg Maisie Fus test: Right NT deg; Left NT deg  POSTURE: rounded shoulders and forward head  PALPATION: TTP peri L knee. Wrapped with ace bandage and dressing in place  LOWER EXTREMITY ROM:  Active ROM Right eval Left eval LT 01/28/24  Hip flexion     Hip extension     Hip abduction     Hip adduction     Hip internal rotation     Hip external rotation     Knee flexion  90 120  Knee extension  4 lacking 2 lacking  Ankle dorsiflexion     Ankle plantarflexion     Ankle inversion     Ankle eversion      (Blank rows = not tested)  LOWER EXTREMITY MMT:  MMT Right eval Left eval  Hip flexion 5 4  Hip extension    Hip abduction 5 4  Hip adduction    Hip internal rotation    Hip external rotation 5 4  Knee flexion 5 NT  Knee extension 5 NT  Ankle dorsiflexion    Ankle plantarflexion    Ankle inversion    Ankle eversion     (Blank rows = not tested)  LOWER EXTREMITY SPECIAL TESTS:  NT  FUNCTIONAL TESTS: Assess when pt is able to walk without an assist device 5 times sit to stand: TBA 2 minute walk test: TBA  GAIT: Distance walked: 200' Assistive device utilized: Crutches Level of assistance: Modified independence Comments: Swing through pattern                                                                                                                   TREATMENT DATE:   [facility] Adult PT Treatment:                                                DATE: 02/02/24 Therapeutic Activity: NuStep for 5 min L6 UE and LE  2 min walk 592 feet  5 x STS : 12.26 sec min pain  Knee  extension ( in place of heel slides) hold L thigh Quad set to SLR x 2 x 10  Hamstring  stretch 60 sec Knee flexion AAROM to 120 deg (130 deg PROM)  Bridge x 10  Hip abduction x 15 each side Hip adduction LLE x 10  Sit to stand with green band x 10  Hip abd with GTB x 10  Squat with GTB x 10 , mod cues for posterior weight distribution   [facility] Adult PT Treatment:                                                DATE: 01/28/24 Therapeutic Exercise: Nusteps 5 mins L3 UE/LE Heel slides x10 Quad sets x10 5" SLR c QS x10  Clam x10 3" GTB Hip add ball squeeze x10 3" SAQ x10 3" Hip abd x10 3" Modalities: Cold pack to the L knee x10 mins c elevation  [facility] Adult PT Treatment:                                                DATE: 01/21/24 Therapeutic Exercise: Developed, instructed in, and pt completed therex as noted in HEP  Self Care: RICE for management of pain and swelling 10-15 mins periodically    PATIENT EDUCATION:  Education details: Eval findings, POC, HEP, self care  Person educated: Patient Education method: Explanation, Demonstration, Tactile cues, Verbal cues, and Handouts Education comprehension: verbalized understanding, returned demonstration, verbal cues required, and tactile cues required  HOME EXERCISE PROGRAM: Access Code: [email or web address] URL: [email or web address] Date: 01/28/2024 Prepared by: Joellyn Rued  Exercises - Supine Heel Slide  - 2-3 x daily - 7 x weekly - 10 reps - 5 hold - Supine Quad Set  - 2-3 x daily - 7 x weekly - 1 sets - 10 reps - 5 hold - Active Straight Leg Raise with Quad Set  - 2-3 x daily - 7 x weekly - 1 sets - 10 reps - 5 hold - Sidelying Hip Abduction  - 2-3 x daily - 7 x weekly - 1 sets - 10 reps - 3 hold - Hooklying Clamshell with Resistance  - 2-3 x daily - 7 x weekly - 1 sets - 10 reps - 3 hold - Supine Hip Adduction Isometric with Ball  - 2-3 x daily - 7 x weekly - 1 sets - 10 reps - 3 hold - Supine Knee Extension Strengthening  - 2-3 x daily - 7 x weekly - 1 sets - 10 reps - 3 hold - Supine Single Leg Ankle Pumps   - 2 x daily - 7 x weekly - 1 sets - 10 reps - Supine Ankle Circles  - 2 x daily - 7 x weekly - 1 sets - 10 reps  ASSESSMENT:  CLINICAL IMPRESSION: Pt was able to walk an excellent pace for 2 min and work on lower body strength throughout her session with minimal pain overall (3/10-4/10).  ROM is improving and she reports less pain after doing her home program.  She has met her short term goals. Needed increased cueing for standing exercises for alignment. Pt will continue to benefit from skilled PT to address impairments for improved L LE function.    OBJECTIVE IMPAIRMENTS:  Abnormal gait, decreased activity tolerance, difficulty walking, decreased ROM, decreased strength, increased edema, and pain.   ACTIVITY LIMITATIONS: carrying, lifting, bending, sitting, standing, squatting, sleeping, stairs, transfers, bed mobility, bathing, toileting, dressing, locomotion level, and caring for others  PARTICIPATION LIMITATIONS: meal prep, cleaning, laundry, driving, shopping, community activity, and occupation  PERSONAL FACTORS: Past/current experiences and Time since onset of injury/illness/exacerbation Chronic back pain, are also affecting patient's functional outcome.   REHAB POTENTIAL: Good  CLINICAL DECISION MAKING: Evolving/moderate complexity  EVALUATION COMPLEXITY: Moderate   GOALS:  SHORT TERM GOALS: Target date: 02/12/24 Pt will be Ind in an initial HEP  Baseline: started Goal status: MET  2.  Pt will voice understanding of measures to assist in pain and swelling reduction  Baseline: started Goal status: MET   3.  L knee AROM will increase to 0-115d for good functional outcome Baseline: 4-90 Goal status: ongoing , met for flexion   LONG TERM GOALS: Target date: 03/25/24  Pt will be Ind in a final HEP to maintain achieved LOF  Baseline: started Goal status: INITIAL  2.  L LE will demonstrated 5/5 strength for improved function with ADLs Baseline: see flow sheet Goal status:  ongoing   3.  L knee will demonstrate 0-125d of AROM for appropriate function with sitting, ascending/descending steps, ambulation Baseline: 4-90d Goal status: ongoing   4.  Improve 5xSTS by MCID of 5" and by MCID of 47ft as indication of improved functional mobility  Baseline: 12.6 sec and 592 feet  Goal status: ongoing   5.  Pt will be able to ascend and descend 18 steps c HR assist for home and community mobility Baseline: assist by crutches  Goal status: INITIAL  6.  Pt will be able to walk with a normalized gait pattern 1000' for community mobility and to progress pt toward return to dance Baseline: Assist by crutches Goal status: INITIAL   PLAN:  PT FREQUENCY: 2x/week  PT DURATION: 8 weeks  PLANNED INTERVENTIONS: 97164- PT Re-evaluation, 97110-Therapeutic exercises, 97530- Therapeutic activity, [id number]- Neuromuscular re-education, 97535- Self Care, 16109- Manual therapy, [id number]- Gait training, [phone]- Electrical stimulation (unattended), 97016- Vasopneumatic device, Patient/Family education, Balance training, Stair training, Taping, Dry Needling, Joint mobilization, Cryotherapy, and Moist heat  PLAN FOR NEXT SESSION: Add in more closed chain.  Quad and hip strength.  Assess response to HEP; progress therex as indicated; use of modalities, manual therapy; and TPDN as indicated.   Karie Mainland, PT 02/02/24 10:00 AM Phone: [phone] Fax: [phone]

## 2024-02-03 NOTE — Therapy (Signed)
 OUTPATIENT PHYSICAL THERAPY LOWER EXTREMITY TREATMENT   Patient Name: Gail Gonzalez MRN: [id number] DOB:Feb 22, 1961, 63 y.o., female Today's Date: 02/03/2024  END OF SESSION:      Past Medical History:  Diagnosis Date   Anemia    Anxiety    Arthritis    Cardiac arrhythmia due to congenital heart disease    Cataract    Chronic back pain    Colon polyps 04/24/2013   Diverticulosis 02/24/2013   Endometriosis    on lap surgery. seen by Dr. Su Hilt   Fatty liver    GERD (gastroesophageal reflux disease)    H. pylori infection    Heart murmur    Hemorrhoids    Internal hemorrhoids    Rectal bleeding    Thyroid disease 10/27/2010   nodule   Tubular adenoma of colon    Past Surgical History:  Procedure Laterality Date   BIOPSY THYROID  2013   BREAST BIOPSY Left    DIAGNOSTIC LAPAROSCOPY  1990   endometriosis   Patient Active Problem List   Diagnosis Date Noted   Hot flashes, menopausal 10/12/2023   Chronic pain of left knee 06/18/2023   Rhinorrhea 06/18/2023   Pain of right lower extremity 01/06/2023   Fatigue 11/25/2022   Grief counseling 02/02/2022   Anxiety disorder 08/18/2020   Uterine leiomyoma 11/18/2017   Preventative health care 10/11/2016   Tobacco use disorder 10/10/2016   Dyslipidemia 10/10/2016   Hyperglycemia 10/10/2016   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  Rationale for Evaluation and Treatment: Rehabilitation  ONSET DATE: 01/18/24  SUBJECTIVE:    SUBJECTIVE STATEMENT: Walking is getting better.  A little pain, 4/10.  Was a little swollen yesterday.   PERTINENT HISTORY: See PMH  PAIN:  Are you having pain? Yes: NPRS scale: 3/10 Pain location: L knee Pain description: Ache Aggravating factors: Bending the knee Relieving factors: Pain medication, cold pack  PRECAUTIONS: Chronic back pain  RED FLAGS: None   WEIGHT BEARING RESTRICTIONS: No WBAT  FALLS:  Has patient fallen in last 6 months? 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 Eventually being able to dance.  NEXT MD VISIT: To call and schedule a post surgical appt c Dr Everardo Pacific  OBJECTIVE:  Note: Objective measures were completed at Evaluation unless otherwise noted.  DIAGNOSTIC FINDINGS:  10/30/23 MRI L knee IMPRESSION: 1. Complex tear of the anterior horn of the lateral meniscus and peripheral meniscal extrusion. 14 mm cystic mass anterior to the anterior horn lateral meniscus likely reflecting a parameniscal cyst. 2. High-grade partial-thickness cartilage loss of the medial patellar facet and patellar apex with mild subchondral marrow edema in the patellar apex. 3. Mild partial-thickness cartilage loss of the weight-bearing lateral femorotibial compartment.  PATIENT SURVEYS:  LEFS 15/80 = 19%  or 81% limitation  COGNITION: Overall cognitive status: Within functional limits for tasks assessed     SENSATION: WFL  EDEMA:  Present- covered by post surgical dressing  MUSCLE LENGTH: Hamstrings: Right NT deg; Left NT deg Maisie Fus test: Right NT deg; Left NT deg  POSTURE: rounded shoulders and forward head  PALPATION: TTP peri L knee. Wrapped with ace bandage and  dressing in place  LOWER EXTREMITY ROM:  Active ROM Right eval Left eval LT 01/28/24  Hip flexion     Hip extension     Hip abduction     Hip adduction     Hip internal rotation     Hip external rotation     Knee flexion  90 120  Knee extension  4 lacking 2 lacking  Ankle dorsiflexion     Ankle plantarflexion     Ankle inversion     Ankle eversion      (Blank rows = not tested)  LOWER EXTREMITY MMT:  MMT Right eval Left eval  Hip flexion 5 4  Hip extension    Hip abduction 5 4  Hip adduction    Hip internal rotation    Hip external rotation 5 4  Knee flexion 5 NT  Knee extension 5 NT  Ankle dorsiflexion    Ankle plantarflexion    Ankle inversion    Ankle eversion     (Blank rows = not tested)  LOWER EXTREMITY SPECIAL TESTS:  NT  FUNCTIONAL TESTS: Assess when pt is able to walk without an assist device 5 times sit to stand: TBA 2 minute walk test: TBA  GAIT: Distance walked: 200' Assistive device utilized: Crutches Level of assistance: Modified independence Comments: Swing through pattern                                                                                                                   TREATMENT DATE:  [facility] Adult PT Treatment:                                                DATE: 02/04/24 Therapeutic Activity: NuStep for 5 min L6 UE and LE  2 min walk 592 feet  5 x STS : 12.26 sec min pain  Knee extension ( in place of heel slides) hold L thigh Quad set to SLR x 2 x 10  Hamstring stretch 60 sec Knee flexion AAROM to 120 deg (130 deg PROM)  Bridge x 10  Hip abduction x 15 each side Hip adduction LLE x 10  Sit to stand with green band x 10  Hip abd with GTB x 10  Squat with GTB x 10 , mod cues for posterior weight distribution   Therapeutic Exercise: *** Manual Therapy: *** Neuromuscular re-ed: *** Therapeutic Activity: *** Modalities: *** Self Care: ***  [facility] Adult PT Treatment:  DATE: 02/02/24 Therapeutic Activity: NuStep for 5 min L6 UE and LE  2 min walk 592 feet  5 x STS : 12.26 sec min pain  Knee extension ( in place of heel slides) hold L thigh Quad set to SLR x 2 x 10  Hamstring stretch 60 sec Knee flexion AAROM to 120 deg (130 deg PROM)  Bridge x 10  Hip abduction x 15 each side Hip adduction LLE x 10  Sit to stand with green band x 10  Hip abd with GTB x 10  Squat with GTB x 10 , mod cues for posterior weight distribution   [facility] Adult PT Treatment:                                                DATE: 01/28/24 Therapeutic Exercise: Nusteps 5 mins L3 UE/LE Heel slides x10 Quad sets x10 5" SLR c QS x10  Clam x10 3" GTB Hip add ball squeeze x10 3" SAQ x10 3" Hip abd x10 3" Modalities: Cold pack to the L knee x10 mins c elevation  [facility] Adult PT Treatment:                                                DATE: 01/21/24 Therapeutic Exercise: Developed, instructed in, and pt completed therex as noted in HEP  Self Care: RICE for management of pain and swelling 10-15 mins periodically    PATIENT EDUCATION:  Education details: Eval findings, POC, HEP, self care  Person educated: Patient Education method: Explanation, Demonstration, Tactile cues, Verbal cues, and Handouts Education comprehension: verbalized understanding, returned demonstration, verbal cues required, and tactile cues required  HOME EXERCISE PROGRAM: Access Code: [email or web address] URL: [email or web address] Date: 01/28/2024 Prepared by: Joellyn Rued  Exercises - Supine Heel Slide  - 2-3 x daily - 7 x weekly - 10 reps - 5 hold - Supine Quad Set  - 2-3 x daily - 7 x weekly - 1 sets - 10 reps - 5 hold - Active Straight Leg Raise with Quad Set  - 2-3 x daily - 7 x weekly - 1 sets - 10 reps - 5 hold - Sidelying Hip Abduction  - 2-3 x daily - 7 x weekly - 1 sets - 10 reps - 3 hold - Hooklying Clamshell with Resistance  - 2-3 x daily - 7 x weekly - 1 sets - 10 reps - 3 hold - Supine Hip  Adduction Isometric with Ball  - 2-3 x daily - 7 x weekly - 1 sets - 10 reps - 3 hold - Supine Knee Extension Strengthening  - 2-3 x daily - 7 x weekly - 1 sets - 10 reps - 3 hold - Supine Single Leg Ankle Pumps  - 2 x daily - 7 x weekly - 1 sets - 10 reps - Supine Ankle Circles  - 2 x daily - 7 x weekly - 1 sets - 10 reps  ASSESSMENT:  CLINICAL IMPRESSION: Pt was able to walk an excellent pace for 2 min and work on lower body strength throughout her session with minimal pain overall (3/10-4/10).  ROM is improving and she reports less pain after doing her home program.  She has met her short term goals. Needed  increased cueing for standing exercises for alignment. Pt will continue to benefit from skilled PT to address impairments for improved L LE function.    OBJECTIVE IMPAIRMENTS: Abnormal gait, decreased activity tolerance, difficulty walking, decreased ROM, decreased strength, increased edema, and pain.   ACTIVITY LIMITATIONS: carrying, lifting, bending, sitting, standing, squatting, sleeping, stairs, transfers, bed mobility, bathing, toileting, dressing, locomotion level, and caring for others  PARTICIPATION LIMITATIONS: meal prep, cleaning, laundry, driving, shopping, community activity, and occupation  PERSONAL FACTORS: Past/current experiences and Time since onset of injury/illness/exacerbation Chronic back pain, are also affecting patient's functional outcome.   REHAB POTENTIAL: Good  CLINICAL DECISION MAKING: Evolving/moderate complexity  EVALUATION COMPLEXITY: Moderate   GOALS:  SHORT TERM GOALS: Target date: 02/12/24 Pt will be Ind in an initial HEP  Baseline: started Goal status: MET  2.  Pt will voice understanding of measures to assist in pain and swelling reduction  Baseline: started Goal status: MET   3.  L knee AROM will increase to 0-115d for good functional outcome Baseline: 4-90 Goal status: ongoing , met for flexion   LONG TERM GOALS: Target date:  03/25/24  Pt will be Ind in a final HEP to maintain achieved LOF  Baseline: started Goal status: INITIAL  2.  L LE will demonstrated 5/5 strength for improved function with ADLs Baseline: see flow sheet Goal status: ongoing   3.  L knee will demonstrate 0-125d of AROM for appropriate function with sitting, ascending/descending steps, ambulation Baseline: 4-90d Goal status: ongoing   4.  Improve 5xSTS by MCID of 5" and by MCID of 34ft as indication of improved functional mobility  Baseline: 12.6 sec and 592 feet  Goal status: ongoing   5.  Pt will be able to ascend and descend 18 steps c HR assist for home and community mobility Baseline: assist by crutches  Goal status: INITIAL  6.  Pt will be able to walk with a normalized gait pattern 1000' for community mobility and to progress pt toward return to dance Baseline: Assist by crutches Goal status: INITIAL   PLAN:  PT FREQUENCY: 2x/week  PT DURATION: 8 weeks  PLANNED INTERVENTIONS: 97164- PT Re-evaluation, 97110-Therapeutic exercises, 97530- Therapeutic activity, [id number]- Neuromuscular re-education, 97535- Self Care, 60454- Manual therapy, [id number]- Gait training, [phone]- Electrical stimulation (unattended), 97016- Vasopneumatic device, Patient/Family education, Balance training, Stair training, Taping, Dry Needling, Joint mobilization, Cryotherapy, and Moist heat  PLAN FOR NEXT SESSION: Add in more closed chain.  Quad and hip strength.  Assess response to HEP; progress therex as indicated; use of modalities, manual therapy; and TPDN as indicated.   Semira Stoltzfus MS, PT 02/03/24 7:57 AM

## 2024-02-04 ENCOUNTER — Ambulatory Visit

## 2024-02-04 DIAGNOSIS — M5459 Other low back pain: Secondary | ICD-10-CM

## 2024-02-04 DIAGNOSIS — M6281 Muscle weakness (generalized): Secondary | ICD-10-CM

## 2024-02-05 NOTE — Therapy (Signed)
 OUTPATIENT PHYSICAL THERAPY LOWER EXTREMITY TREATMENT   Patient Name: Gail Gonzalez MRN: [id number] DOB:[birth year], 63 y.o., female Today's Date: 02/08/2024  END OF SESSION:  PT End of Session - 02/08/24 1421     Visit Number 5    Number of Visits 17    Date for PT Re-Evaluation 03/25/24    Authorization Type [employer]    Authorization - Visit Number 5    Authorization - Number of Visits 27    PT Start Time 1415    PT Stop Time 1500    PT Time Calculation (min) 45 min    Activity Tolerance Patient tolerated treatment well    Behavior During Therapy [facility] for tasks assessed/performed                 Past Medical History:  Diagnosis Date   Anemia    Anxiety    Arthritis    Cardiac arrhythmia due to congenital heart disease    Cataract    Chronic back pain    Colon polyps 04/24/2013   Diverticulosis 02/24/2013   Endometriosis    on lap surgery. seen by Dr. Adelene Homer   Fatty liver    GERD (gastroesophageal reflux disease)    H. pylori infection    Heart murmur    Hemorrhoids    Internal hemorrhoids    Rectal bleeding    Thyroid disease 10/27/2010   nodule   Tubular adenoma of colon    Past Surgical History:  Procedure Laterality Date   BIOPSY THYROID  2013   BREAST BIOPSY Left    DIAGNOSTIC LAPAROSCOPY  1990   endometriosis   Patient Active Problem List   Diagnosis Date Noted   Hot flashes, menopausal 10/12/2023   Chronic pain of left knee 06/18/2023   Rhinorrhea 06/18/2023   Pain of right lower extremity 01/06/2023   Fatigue 11/25/2022   Grief counseling 02/02/2022   Anxiety disorder 08/18/2020   Uterine leiomyoma 11/18/2017   Preventative health care 10/11/2016   Tobacco use disorder 10/10/2016   Dyslipidemia 10/10/2016   Hyperglycemia 10/10/2016   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   Seborrheic keratosis 03/06/2016   Back pain 03/06/2016   Vitamin D deficiency 06/11/2015   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   Baker's cyst, unruptured  12/30/2012   GERD (gastroesophageal reflux disease) 07/29/2012   Endometriosis 07/29/2012   Thyroid nodule 07/29/2012   S/P left breast biopsy 07/29/2012    PCP: Wilhemena Harbour, MD   REFERRING PROVIDER: Micheline Ahr, MD   REFERRING DIAG: Left knee arthroscopy with lateral meniscectomy 01/18/24   THERAPY DIAG:  Other low back pain  Muscle weakness  Rationale for Evaluation and Treatment: Rehabilitation  ONSET DATE: 01/18/24  SUBJECTIVE:   SUBJECTIVE STATEMENT: Mild swelling L knee maybe a bit for did a lot of walking over the weekend /  Used ice after and elevated L knee.   PERTINENT HISTORY: See PMH  PAIN:  Are you having pain? Yes: NPRS scale: 1/10 Pain location: L knee Pain description: Ache Aggravating factors: Bending the knee Relieving factors: Pain medication, cold pack  PRECAUTIONS: Chronic back pain  RED FLAGS: None   WEIGHT BEARING RESTRICTIONS: No WBAT  FALLS:  Has patient fallen in last 6 months? 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 Eventually being able to dance.  NEXT MD VISIT: To call and schedule a post surgical appt c Dr Yvonne Hering  OBJECTIVE:  Note: Objective measures were completed at Evaluation unless otherwise noted.  DIAGNOSTIC FINDINGS:  10/30/23 MRI L knee IMPRESSION: 1. Complex tear of the anterior horn of the lateral meniscus and peripheral meniscal extrusion. 14 mm cystic mass anterior to the anterior horn lateral meniscus likely reflecting a parameniscal cyst. 2. High-grade partial-thickness cartilage loss of the medial patellar facet and patellar apex with mild subchondral marrow edema in the patellar apex. 3. Mild partial-thickness  cartilage loss of the weight-bearing lateral femorotibial compartment.  PATIENT SURVEYS:  LEFS 15/80 = 19% or 81% limitation  COGNITION: Overall cognitive status: Within functional limits for tasks assessed     SENSATION: WFL  EDEMA:  Present- covered by post surgical dressing  MUSCLE LENGTH: Hamstrings: Right NT deg; Left NT deg Andy Bannister test: Right NT deg; Left NT deg  POSTURE: rounded shoulders and forward head  PALPATION: TTP peri L knee. Wrapped with ace bandage and dressing in place  LOWER EXTREMITY ROM:  Active ROM Right eval Left eval LT 01/28/24  Hip flexion     Hip extension     Hip abduction     Hip adduction     Hip internal rotation     Hip external rotation     Knee flexion  90 120  Knee extension  4 lacking 2 lacking  Ankle dorsiflexion     Ankle plantarflexion     Ankle inversion     Ankle eversion      (Blank rows = not tested)  LOWER EXTREMITY MMT:  MMT Right eval Left eval  Hip flexion 5 4  Hip extension    Hip abduction 5 4  Hip adduction    Hip internal rotation    Hip external rotation 5 4  Knee flexion 5 NT  Knee extension 5 NT  Ankle dorsiflexion    Ankle plantarflexion    Ankle inversion    Ankle eversion     (Blank rows = not tested)  LOWER EXTREMITY SPECIAL TESTS:  NT  FUNCTIONAL TESTS: Assess when pt is able to walk without an assist device 5 times sit to stand: TBA 2 minute walk test: TBA  GAIT: Distance walked: 200' Assistive device utilized: Crutches Level of assistance: Modified independence Comments: Swing through pattern                                                                                                                   TREATMENT DATE:   [facility] Adult PT Treatment:                                                DATE: 02/08/24  Therapeutic Activity: NuStep  Squat x 10  Heel raise x 15  Hip  semi circle x 30 sec  High knee march standing on Airex  Hip flexor stretch  Hip hinge (single leg) x 10  each side 0-2 UE assist  Supine clam GTB x 15  Supine banded bridge GTB x 10 Bridge with clam x 10 each side  Bridge with march x 10  SLR x 15  Supine Banded knee extension (red band) LLE x 45 sec    [facility] Adult PT Treatment:                                                DATE: 02/04/24 Therapeutic Activity: NuStep for 5 min L6 UE and LE  Quad set x10 5" Quad set to SLR x 2 x 10  Hamstring stretch 2x30 sec Heel slides x10 Bridge x 15  Banded side steps Banded monster and reverse monster steps Heel raises/toe lifts 2x10 SL and tandem balance Wall squat with ball squeeze 2 x 10  [facility] Adult PT Treatment:                                                DATE: 02/02/24 Therapeutic Activity: NuStep for 5 min L6 UE and LE  2 min walk 592 feet  5 x STS : 12.26 sec min pain  Knee extension ( in place of heel slides) hold L thigh Quad set to SLR x 2 x 10  Hamstring stretch 60 sec Knee flexion AAROM to 120 deg (130 deg PROM)  Bridge x 10  Hip abduction x 15 each side Hip adduction LLE x 10  Sit to stand with green band x 10  Hip abd with GTB x 10  Squat with GTB x 10 , mod cues for posterior weight distribution   [facility] Adult PT Treatment:                                                DATE: 01/28/24 Therapeutic Exercise: Nusteps 5 mins L3 UE/LE Heel slides x10 Quad sets x10 5" SLR c QS x10  Clam x10 3" GTB Hip add ball squeeze x10 3" SAQ x10 3" Hip abd x10 3" Modalities: Cold pack to the L knee x10 mins c elevation   PATIENT EDUCATION:  Education details: Eval findings, POC, HEP, self care  Person educated: Patient Education method: Explanation, Demonstration, Tactile cues, Verbal cues, and Handouts Education comprehension: verbalized understanding, returned demonstration, verbal cues required, and tactile cues required  HOME EXERCISE PROGRAM: Access Code: [email or web address] URL: [email or web address] Date: 01/28/2024 Prepared by: Liborio Reeds  Exercises - Supine Heel  Slide  - 2-3 x daily - 7 x weekly - 10 reps - 5 hold - Supine Quad Set  - 2-3 x daily - 7 x weekly - 1 sets - 10 reps - 5 hold - Active Straight Leg Raise with Quad Set  - 2-3 x daily - 7 x weekly - 1 sets - 10 reps - 5 hold - Sidelying Hip Abduction  - 2-3 x daily - 7 x weekly - 1 sets - 10 reps - 3 hold - Hooklying Clamshell with Resistance  -  2-3 x daily - 7 x weekly - 1 sets - 10 reps - 3 hold - Supine Hip Adduction Isometric with Ball  - 2-3 x daily - 7 x weekly - 1 sets - 10 reps - 3 hold - Supine Knee Extension Strengthening  - 2-3 x daily - 7 x weekly - 1 sets - 10 reps - 3 hold - Supine Single Leg Ankle Pumps  - 2 x daily - 7 x weekly - 1 sets - 10 reps - Supine Ankle Circles  - 2 x daily - 7 x weekly - 1 sets - 10 reps  ASSESSMENT:  CLINICAL IMPRESSION: Patient continues to make good progress with respect to ambulation and functional activity tolerance.  She describes her weekend in which she walked around [place] without any significant discomfort.  Active knee range of motion proving to 126 deg still lacking a few degrees of extension.  She will continue to benefit skilled PT in order to hopefully optimize her mobility. OBJECTIVE IMPAIRMENTS: Abnormal gait, decreased activity tolerance, difficulty walking, decreased ROM, decreased strength, increased edema, and pain.   ACTIVITY LIMITATIONS: carrying, lifting, bending, sitting, standing, squatting, sleeping, stairs, transfers, bed mobility, bathing, toileting, dressing, locomotion level, and caring for others  PARTICIPATION LIMITATIONS: meal prep, cleaning, laundry, driving, shopping, community activity, and occupation  PERSONAL FACTORS: Past/current experiences and Time since onset of injury/illness/exacerbation Chronic back pain, are also affecting patient's functional outcome.   REHAB POTENTIAL: Good  CLINICAL DECISION MAKING: Evolving/moderate complexity  EVALUATION COMPLEXITY: Moderate   GOALS:  SHORT TERM GOALS:  Target date: 02/12/24 Pt will be Ind in an initial HEP  Baseline: started Goal status: MET  2.  Pt will voice understanding of measures to assist in pain and swelling reduction  Baseline: started Goal status: MET   3.  L knee AROM will increase to 0-115d for good functional outcome Baseline: 4-90, 0-4-126 flexion  Goal status: ongoing , met for flexion   LONG TERM GOALS: Target date: 03/25/24  Pt will be Ind in a final HEP to maintain achieved LOF  Baseline: started Goal status: ongoing   2.  L LE will demonstrated 5/5 strength for improved function with ADLs Baseline: see flow sheet Goal status: ongoing   3.  L knee will demonstrate 0-125d of AROM for appropriate function with sitting, ascending/descending steps, ambulation Baseline: 4-90d Goal status: ongoing   4.  Improve 5xSTS by MCID of 5" and by MCID of 35ft as indication of improved functional mobility  Baseline: 12.6 sec and 592 feet  Goal status: ongoing   5.  Pt will be able to ascend and descend 18 steps c HR assist for home and community mobility Baseline: assist by crutches , does one step at a time  Goal status: ongoing   6.  Pt will be able to walk with a normalized gait pattern 1000' for community mobility and to progress pt toward return to dance Baseline: Assist by crutches Goal status: MET    PLAN:  PT FREQUENCY: 2x/week  PT DURATION: 8 weeks  PLANNED INTERVENTIONS: 97164- PT Re-evaluation, 97110-Therapeutic exercises, 97530- Therapeutic activity, [id number]- Neuromuscular re-education, 97535- Self Care, 16109- Manual therapy, [id number]- Gait training, [id number]- Electrical stimulation (unattended), 97016- Vasopneumatic device, Patient/Family education, Balance training, Stair training, Taping, Dry Needling, Joint mobilization, Cryotherapy, and Moist heat  PLAN FOR NEXT SESSION: Add in more closed chain.  Quad and hip strength.  Assess response to HEP; progress therex as indicated; use of modalities, manual  therapy; and TPDN  as indicated.     Marci Setter, PT 02/08/24 3:03 PM Phone: [phone] Fax: [phone]

## 2024-02-08 ENCOUNTER — Encounter: Payer: Self-pay | Admitting: Physical Therapy

## 2024-02-08 ENCOUNTER — Ambulatory Visit: Admitting: Physical Therapy

## 2024-02-08 DIAGNOSIS — M5459 Other low back pain: Secondary | ICD-10-CM

## 2024-02-08 DIAGNOSIS — M6281 Muscle weakness (generalized): Secondary | ICD-10-CM

## 2024-02-09 NOTE — Therapy (Unsigned)
 OUTPATIENT PHYSICAL THERAPY LOWER EXTREMITY TREATMENT   Patient Name: Gail Gonzalez MRN: [id number] DOB:[birth year], 63 y.o., female Today's Date: 02/10/2024  END OF SESSION:  PT End of Session - 02/10/24 1017     Visit Number 6    Number of Visits 17    Date for PT Re-Evaluation 03/25/24    Authorization Type [employer]    Authorization - Visit Number 6    Authorization - Number of Visits 27    PT Start Time 0935    PT Stop Time 1020    PT Time Calculation (min) 45 min    Activity Tolerance Patient tolerated treatment well    Behavior During Therapy [facility] for tasks assessed/performed                  Past Medical History:  Diagnosis Date   Anemia    Anxiety    Arthritis    Cardiac arrhythmia due to congenital heart disease    Cataract    Chronic back pain    Colon polyps 04/24/2013   Diverticulosis 02/24/2013   Endometriosis    on lap surgery. seen by Dr. Su Hilt   Fatty liver    GERD (gastroesophageal reflux disease)    H. pylori infection    Heart murmur    Hemorrhoids    Internal hemorrhoids    Rectal bleeding    Thyroid disease 10/27/2010   nodule   Tubular adenoma of colon    Past Surgical History:  Procedure Laterality Date   BIOPSY THYROID  2013   BREAST BIOPSY Left    DIAGNOSTIC LAPAROSCOPY  1990   endometriosis   Patient Active Problem List   Diagnosis Date Noted   Hot flashes, menopausal 10/12/2023   Chronic pain of left knee 06/18/2023   Rhinorrhea 06/18/2023   Pain of right lower extremity 01/06/2023   Fatigue 11/25/2022   Grief counseling 02/02/2022   Anxiety disorder 08/18/2020   Uterine leiomyoma 11/18/2017   Preventative health care 10/11/2016   Tobacco use disorder 10/10/2016   Dyslipidemia 10/10/2016   Hyperglycemia 10/10/2016   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   Seborrheic keratosis 03/06/2016   Back pain 03/06/2016   Vitamin D deficiency 06/11/2015   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 Still has intermittent swelling.  PERTINENT HISTORY: See PMH  PAIN:  Are you having pain? Yes: NPRS scale: 2/10 Pain location: L knee Pain description: Ache Aggravating factors: Bending the knee Relieving factors: Pain medication, cold pack  PRECAUTIONS: Chronic back pain  RED FLAGS: None   WEIGHT BEARING RESTRICTIONS: No WBAT  FALLS:  Has patient fallen in last 6 months? 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 Eventually being able to dance.  NEXT MD VISIT: To  call and schedule a post surgical appt c Dr Yvonne Hering  OBJECTIVE:  Note: Objective measures were completed at Evaluation unless otherwise noted.  DIAGNOSTIC FINDINGS:  10/30/23 MRI L knee IMPRESSION: 1. Complex tear of the anterior horn of the lateral meniscus and peripheral meniscal extrusion. 14 mm cystic mass anterior to the anterior horn lateral meniscus likely reflecting a parameniscal cyst. 2. High-grade partial-thickness cartilage loss of the medial patellar facet and patellar apex with mild subchondral marrow edema in the patellar apex. 3. Mild partial-thickness cartilage loss of the weight-bearing lateral  femorotibial compartment.  PATIENT SURVEYS:  LEFS 15/80 = 19% or 81% limitation  COGNITION: Overall cognitive status: Within functional limits for tasks assessed     SENSATION: WFL  EDEMA:  Present- covered by post surgical dressing  MUSCLE LENGTH: Hamstrings: Right NT deg; Left NT deg Andy Bannister test: Right NT deg; Left NT deg  POSTURE: rounded shoulders and forward head  PALPATION: TTP peri L knee. Wrapped with ace bandage and dressing in place  LOWER EXTREMITY ROM:  Active ROM Right eval Left eval LT 01/28/24 LT 02/10/24  Hip flexion      Hip extension      Hip abduction      Hip adduction      Hip internal rotation      Hip external rotation      Knee flexion  90 120 130  Knee extension  4 lacking 2 lacking 2 lacking  Ankle dorsiflexion      Ankle plantarflexion      Ankle inversion      Ankle eversion       (Blank rows = not tested)  LOWER EXTREMITY MMT:  MMT Right eval Left eval  Hip flexion 5 4  Hip extension    Hip abduction 5 4  Hip adduction    Hip internal rotation    Hip external rotation 5 4  Knee flexion 5 NT  Knee extension 5 NT  Ankle dorsiflexion    Ankle plantarflexion    Ankle inversion    Ankle eversion     (Blank rows = not tested)  LOWER EXTREMITY SPECIAL TESTS:  NT  FUNCTIONAL TESTS: Assess when pt is able to walk without an assist device 5 times sit to stand: TBA 2 minute walk test: TBA  GAIT: Distance walked: 200' Assistive device utilized: Crutches Level of assistance: Modified independence Comments: Swing through pattern                                                                                                                   TREATMENT DATE:  [facility] Adult PT Treatment:                4 wks s/p Sx                                DATE: 02/10/24 Therapeutic Activity: NuStep 6 min L5 UE/LE SLR c quad set x15 Hip semi  circle x 30 sec  Supine clam GTB x 15  Supine banded bridge GTB x 15 Bridge with clam x 10 each  side  Bridge with march 2 x 10  Long sit L hamstring stretch 3x30 STS c airex on mat table x10 SLS L LE s and c opp knee lifts for challenge Heel raise/toe lifts x 15  High knee march standing on Airex  Updated HEP  [facility] Adult PT Treatment:                                                DATE: 02/08/24 Therapeutic Activity: NuStep  Squat x 10  Heel raise x 15  Hip semi circle x 30 sec  High knee march standing on Airex  Hip flexor stretch  Hip hinge (single leg) x 10 each side 0-2 UE assist  Supine clam GTB x 15  Supine banded bridge GTB x 10 Bridge with clam x 10 each side  Bridge with march x 10  SLR x 15  Supine Banded knee extension (red band) LLE x 45 sec    [facility] Adult PT Treatment:                                                DATE: 02/04/24 Therapeutic Activity: NuStep for 5 min L6 UE and LE  Quad set x10 5" Quad set to SLR x 2 x 10  Hamstring stretch 2x30 sec Heel slides x10 Bridge x 15  Banded side steps Banded monster and reverse monster steps Heel raises/toe lifts 2x10 SL and tandem balance Wall squat with ball squeeze 2 x 10   PATIENT EDUCATION:  Education details: Eval findings, POC, HEP, self care  Person educated: Patient Education method: Explanation, Demonstration, Tactile cues, Verbal cues, and Handouts Education comprehension: verbalized understanding, returned demonstration, verbal cues required, and tactile cues required  HOME EXERCISE PROGRAM: Access Code: [email or web address] URL: [email or web address] Date: 02/10/2024 Prepared by: Joellyn Rued  Exercises - Supine Heel Slide  - 2-3 x daily - 7 x weekly - 10 reps - 5 hold - Supine Quad Set  - 2-3 x daily - 7 x weekly - 1 sets - 10 reps - 5 hold - Active Straight Leg Raise with Quad Set  - 2-3 x daily - 7 x weekly - 1 sets - 10 reps - 5 hold - Sidelying Hip Abduction  - 2-3 x daily - 7 x weekly - 1 sets - 10 reps - 3 hold - Hooklying Clamshell with Resistance  - 2-3 x daily - 7 x weekly - 1  sets - 10 reps - 3 hold - Supine Hip Adduction Isometric with Ball  - 2-3 x daily - 7 x weekly - 1 sets - 10 reps - 3 hold - Supine Knee Extension Strengthening  - 2-3 x daily - 7 x weekly - 1 sets - 10 reps - 3 hold - Supine Single Leg Ankle Pumps  - 2 x daily - 7 x weekly - 1 sets - 10 reps - Supine Ankle Circles  - 2 x daily - 7 x weekly - 1 sets - 10 reps - Seated Table Hamstring Stretch  - 1 x daily - 7 x weekly - 1 sets -  3-5 reps - 30 hold  ASSESSMENT:  CLINICAL IMPRESSION: L knee flexion ROM has reached normal ROM. Ext is slightly decreased. Long sit hamstring stretch was added to HEP to address ext. Continued  LE strengthening. Initial CKC and balance exercises were completed to good effect. Pt tolerated PT today without adverse effects. Pt is making progress [id number] of ambulation. Normalized gait pattern for in home and for short walks outside the home. Pt will continue to benefit from skilled PT to address impairments for improved L LE function.  OBJECTIVE IMPAIRMENTS: Abnormal gait, decreased activity tolerance, difficulty walking, decreased ROM, decreased strength, increased edema, and pain.   ACTIVITY LIMITATIONS: carrying, lifting, bending, sitting, standing, squatting, sleeping, stairs, transfers, bed mobility, bathing, toileting, dressing, locomotion level, and caring for others  PARTICIPATION LIMITATIONS: meal prep, cleaning, laundry, driving, shopping, community activity, and occupation  PERSONAL FACTORS: Past/current experiences and Time since onset of injury/illness/exacerbation Chronic back pain, are also affecting patient's functional outcome.   REHAB POTENTIAL: Good  CLINICAL DECISION MAKING: Evolving/moderate complexity  EVALUATION COMPLEXITY: Moderate   GOALS:  SHORT TERM GOALS: Target date: 02/12/24 Pt will be Ind in an initial HEP  Baseline: started Goal status: MET  2.  Pt will voice understanding of measures to assist in pain and swelling reduction   Baseline: started Goal status: MET   3.  L knee AROM will increase to 0-115d for good functional outcome Baseline: 4-90, 0-4-126 flexion  Goal status: ongoing , met for flexion   LONG TERM GOALS: Target date: 03/25/24  Pt will be Ind in a final HEP to maintain achieved LOF  Baseline: started Goal status: ongoing   2.  L LE will demonstrated 5/5 strength for improved function with ADLs Baseline: see flow sheet Goal status: ongoing   3.  L knee will demonstrate 0-125d of AROM for appropriate function with sitting, ascending/descending steps, ambulation Baseline: 4-90d Goal status: ongoing   4.  Improve 5xSTS by MCID of 5" and by MCID of 59ft as indication of improved functional mobility  Baseline: 12.6 sec and 592 feet  Goal status: ongoing   5.  Pt will be able to ascend and descend 18 steps c HR assist for home and community mobility Baseline: assist by crutches , does one step at a time  Goal status: ongoing   6.  Pt will be able to walk with a normalized gait pattern 1000' for community mobility and to progress pt toward return to dance Baseline: Assist by crutches Goal status: MET    PLAN:  PT FREQUENCY: 2x/week  PT DURATION: 8 weeks  PLANNED INTERVENTIONS: 97164- PT Re-evaluation, 97110-Therapeutic exercises, 97530- Therapeutic activity, [id number]- Neuromuscular re-education, 97535- Self Care, 40981- Manual therapy, [id number]- Gait training, [phone]- Electrical stimulation (unattended), 97016- Vasopneumatic device, Patient/Family education, Balance training, Stair training, Taping, Dry Needling, Joint mobilization, Cryotherapy, and Moist heat  PLAN FOR NEXT SESSION: Add in more closed chain.  Quad and hip strength.  Assess response to HEP; progress therex as indicated; use of modalities, manual therapy; and TPDN as indicated.     Kristl Morioka MS, PT 02/10/24 1:26 PM

## 2024-02-10 ENCOUNTER — Ambulatory Visit

## 2024-02-10 DIAGNOSIS — M6281 Muscle weakness (generalized): Secondary | ICD-10-CM | POA: Diagnosis not present

## 2024-02-10 DIAGNOSIS — M5459 Other low back pain: Secondary | ICD-10-CM

## 2024-02-15 ENCOUNTER — Ambulatory Visit: Admitting: Physical Therapy

## 2024-02-15 DIAGNOSIS — M6281 Muscle weakness (generalized): Secondary | ICD-10-CM

## 2024-02-15 DIAGNOSIS — M5459 Other low back pain: Secondary | ICD-10-CM | POA: Diagnosis not present

## 2024-02-15 NOTE — Therapy (Signed)
 OUTPATIENT PHYSICAL THERAPY LOWER EXTREMITY TREATMENT   Patient Name: Gail Gonzalez MRN: [id number] DOB:Jun 25, 1961, 63 y.o., female Today's Date: 02/15/2024  END OF SESSION:  PT End of Session - 02/15/24 1456     Visit Number 7    Number of Visits 17    Date for PT Re-Evaluation 03/25/24    Authorization Type [employer]    Authorization - Visit Number 7    Authorization - Number of Visits 27    PT Start Time 0249    PT Stop Time 0337    PT Time Calculation (min) 48 min                   Past Medical History:  Diagnosis Date   Anemia    Anxiety    Arthritis    Cardiac arrhythmia due to congenital heart disease    Cataract    Chronic back pain    Colon polyps 04/24/2013   Diverticulosis 02/24/2013   Endometriosis    on lap surgery. seen by Dr. Adelene Homer   Fatty liver    GERD (gastroesophageal reflux disease)    H. pylori infection    Heart murmur    Hemorrhoids    Internal hemorrhoids    Rectal bleeding    Thyroid  disease 10/27/2010   nodule   Tubular adenoma of colon    Past Surgical History:  Procedure Laterality Date   BIOPSY THYROID   2013   BREAST BIOPSY Left    DIAGNOSTIC LAPAROSCOPY  1990   endometriosis   Patient Active Problem List   Diagnosis Date Noted   Hot flashes, menopausal 10/12/2023   Chronic pain of left knee 06/18/2023   Rhinorrhea 06/18/2023   Pain of right lower extremity 01/06/2023   Fatigue 11/25/2022   Grief counseling 02/02/2022   Anxiety disorder 08/18/2020   Uterine leiomyoma 11/18/2017   Preventative health care 10/11/2016   Tobacco use disorder 10/10/2016   Dyslipidemia 10/10/2016   Hyperglycemia 10/10/2016   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   Seborrheic keratosis 03/06/2016   Back pain 03/06/2016   Vitamin D  deficiency 06/11/2015   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   Baker's cyst, unruptured 12/30/2012   GERD (gastroesophageal reflux disease) 07/29/2012   Endometriosis 07/29/2012   Thyroid  nodule  07/29/2012   S/P left breast biopsy 07/29/2012    PCP: Wilhemena Harbour, MD   REFERRING PROVIDER: Micheline Ahr, MD   REFERRING DIAG: Left knee arthroscopy with lateral meniscectomy 01/18/24   THERAPY DIAG:  Muscle weakness  Rationale for Evaluation and Treatment: Rehabilitation  ONSET DATE: 01/18/24  SUBJECTIVE:   SUBJECTIVE STATEMENT: Used crutch some this weekend with camping 3 days. Did my PT exercises every day. I see MD tomorrow for F/U.   PERTINENT HISTORY: See PMH  PAIN:  Are you having pain? Yes: NPRS scale: 2/10 Pain location: L knee Pain description: Ache Aggravating factors: Bending the knee Relieving factors: Pain medication, cold pack  PRECAUTIONS: Chronic back pain  RED FLAGS: None   WEIGHT BEARING RESTRICTIONS: No WBAT  FALLS:  Has patient fallen in last 6 months? 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 Eventually being able to dance.  NEXT MD VISIT: To call and schedule a post surgical appt c Dr Yvonne Hering  OBJECTIVE:  Note: Objective measures were completed at Evaluation unless otherwise noted.  DIAGNOSTIC FINDINGS:  10/30/23 MRI L knee IMPRESSION: 1. Complex tear of the anterior horn of the lateral meniscus and peripheral meniscal extrusion. 14 mm cystic mass anterior to the anterior horn lateral meniscus likely reflecting a parameniscal cyst. 2. High-grade partial-thickness cartilage loss of the medial patellar facet and patellar apex with mild subchondral marrow edema in the patellar apex. 3. Mild partial-thickness cartilage loss of the weight-bearing lateral femorotibial compartment.  PATIENT SURVEYS:  LEFS 15/80 = 19% or 81%  limitation  COGNITION: Overall cognitive status: Within functional limits for tasks assessed     SENSATION: WFL  EDEMA:  Present- covered by post surgical dressing  MUSCLE LENGTH: Hamstrings: Right NT deg; Left NT deg Andy Bannister test: Right NT deg; Left NT deg  POSTURE: rounded shoulders and forward head  PALPATION: TTP peri L knee. Wrapped with ace bandage and dressing in place  LOWER EXTREMITY ROM:  Active ROM Right eval Left eval LT 01/28/24 LT 02/10/24  Hip flexion      Hip extension      Hip abduction      Hip adduction      Hip internal rotation      Hip external rotation      Knee flexion  90 120 130  Knee extension  4 lacking 2 lacking 2 lacking  Ankle dorsiflexion      Ankle plantarflexion      Ankle inversion      Ankle eversion       (Blank rows = not tested)  LOWER EXTREMITY MMT:  MMT Right eval Left eval  Hip flexion 5 4  Hip extension    Hip abduction 5 4  Hip adduction    Hip internal rotation    Hip external rotation 5 4  Knee flexion 5 NT  Knee extension 5 NT  Ankle dorsiflexion    Ankle plantarflexion    Ankle inversion    Ankle eversion     (Blank rows = not tested)  LOWER EXTREMITY SPECIAL TESTS:  NT  FUNCTIONAL TESTS: Assess when pt is able to walk without an assist device 5 times sit to stand: TBA 2 minute walk test: TBA 12.6 sec and 592 feet   GAIT: Distance walked: 200' Assistive device utilized: Crutches Level of assistance: Modified independence Comments: Swing through pattern                                                                                                                   TREATMENT DATE:  [facility] Adult PT Treatment:                                                DATE: 02/15/24 Therapeutic Exercise: QS x 10 SLR x 15  Supine Blue band clam Banded Bridge 10 x 2  Seated h/s stretch  Therapeutic Activity: Nustep L5 UE/LE x 6 minutes Heel toe raises Marching on Airex without UE Tandem on AIREX LLE  back SLS on foam oval with hip abdct and ext x 10 each bilat  STS without UE x 10 Modalities:  Ice pack x 10 min post tx, left knee   [facility] Adult PT Treatment:                4 wks s/p Sx                                DATE: 02/10/24 Therapeutic Activity: NuStep 6 min L5 UE/LE SLR c quad set x15 Hip semi circle x 30 sec  Supine clam GTB x 15  Supine banded bridge GTB x 15 Bridge with clam x 10 each side  Bridge with march 2 x 10  Long sit L hamstring stretch 3x30 STS c airex on mat table x10 SLS L LE s and c opp knee lifts for challenge Heel raise/toe lifts x 15  High knee march standing on Airex  Updated HEP  [facility] Adult PT Treatment:                                                DATE: 02/08/24 Therapeutic Activity: NuStep  Squat x 10  Heel raise x 15  Hip semi circle x 30 sec  High knee march standing on Airex  Hip flexor stretch  Hip hinge (single leg) x 10 each side 0-2 UE assist  Supine clam GTB x 15  Supine banded bridge GTB x 10 Bridge with clam x 10 each side  Bridge with march x 10  SLR x 15  Supine Banded knee extension (red band) LLE x 45 sec    [facility] Adult PT Treatment:                                                DATE: 02/04/24 Therapeutic Activity: NuStep for 5 min L6 UE and LE  Quad set x10 5" Quad set to SLR x 2 x 10  Hamstring stretch 2x30 sec Heel slides x10 Bridge x 15  Banded side steps Banded monster and reverse monster steps Heel raises/toe lifts 2x10 SL and tandem balance Wall squat with ball squeeze 2 x 10   PATIENT EDUCATION:  Education details: Eval findings, POC, HEP, self care  Person educated: Patient Education method: Explanation, Demonstration, Tactile cues, Verbal cues, and Handouts Education comprehension: verbalized understanding, returned demonstration, verbal cues required, and tactile cues required  HOME EXERCISE PROGRAM: Access Code: [email or web address] URL: [email or web address] Date: 02/10/2024 Prepared by: Liborio Reeds  Exercises - Supine Heel Slide  - 2-3 x daily - 7 x weekly - 10 reps - 5 hold - Supine Quad Set  - 2-3 x daily - 7 x weekly - 1 sets - 10 reps - 5 hold - Active Straight Leg Raise with Quad Set  - 2-3 x daily - 7 x weekly - 1 sets - 10 reps - 5 hold - Sidelying Hip Abduction  - 2-3 x daily - 7 x weekly - 1 sets - 10 reps - 3 hold - Hooklying Clamshell with Resistance  -  2-3 x daily - 7 x weekly - 1 sets - 10 reps - 3 hold - Supine Hip Adduction Isometric with Ball  - 2-3 x daily - 7 x weekly - 1 sets - 10 reps - 3 hold - Supine Knee Extension Strengthening  - 2-3 x daily - 7 x weekly - 1 sets - 10 reps - 3 hold - Supine Single Leg Ankle Pumps  - 2 x daily - 7 x weekly - 1 sets - 10 reps - Supine Ankle Circles  - 2 x daily - 7 x weekly - 1 sets - 10 reps - Seated Table Hamstring Stretch  - 1 x daily - 7 x weekly - 1 sets - 3-5 reps - 30 hold  ASSESSMENT:  CLINICAL IMPRESSION: Pt reports she did well camping for 3 days over the weekend. She took her crutch which helped with longer distances. Continued with single leg stability and LE strengthening without /c/o pain. Pt will continue to benefit from skilled PT to address impairments for improved L LE function.  OBJECTIVE IMPAIRMENTS: Abnormal gait, decreased activity tolerance, difficulty walking, decreased ROM, decreased strength, increased edema, and pain.   ACTIVITY LIMITATIONS: carrying, lifting, bending, sitting, standing, squatting, sleeping, stairs, transfers, bed mobility, bathing, toileting, dressing, locomotion level, and caring for others  PARTICIPATION LIMITATIONS: meal prep, cleaning, laundry, driving, shopping, community activity, and occupation  PERSONAL FACTORS: Past/current experiences and Time since onset of injury/illness/exacerbation Chronic back pain, are also affecting patient's functional outcome.   REHAB POTENTIAL: Good  CLINICAL DECISION MAKING: Evolving/moderate complexity  EVALUATION COMPLEXITY:  Moderate   GOALS:  SHORT TERM GOALS: Target date: 02/12/24 Pt will be Ind in an initial HEP  Baseline: started Goal status: MET  2.  Pt will voice understanding of measures to assist in pain and swelling reduction  Baseline: started Goal status: MET   3.  L knee AROM will increase to 0-115d for good functional outcome Baseline: 4-90, 0-4-126 flexion  Goal status: ongoing , met for flexion   LONG TERM GOALS: Target date: 03/25/24  Pt will be Ind in a final HEP to maintain achieved LOF  Baseline: started Goal status: ongoing   2.  L LE will demonstrated 5/5 strength for improved function with ADLs Baseline: see flow sheet Goal status: ongoing   3.  L knee will demonstrate 0-125d of AROM for appropriate function with sitting, ascending/descending steps, ambulation Baseline: 4-90d Goal status: ongoing   4.  Improve 5xSTS by MCID of 5" and by MCID of 77ft as indication of improved functional mobility  Baseline: 12.6 sec and 592 feet  Goal status: ongoing   5.  Pt will be able to ascend and descend 18 steps c HR assist for home and community mobility Baseline: assist by crutches , does one step at a time  Goal status: ongoing   6.  Pt will be able to walk with a normalized gait pattern 1000' for community mobility and to progress pt toward return to dance Baseline: Assist by crutches Goal status: MET    PLAN:  PT FREQUENCY: 2x/week  PT DURATION: 8 weeks  PLANNED INTERVENTIONS: 97164- PT Re-evaluation, 97110-Therapeutic exercises, 97530- Therapeutic activity, [id number]- Neuromuscular re-education, 97535- Self Care, [id number]- Manual therapy, [id number]- Gait training, [phone]- Electrical stimulation (unattended), 97016- Vasopneumatic device, Patient/Family education, Balance training, Stair training, Taping, Dry Needling, Joint mobilization, Cryotherapy, and Moist heat  PLAN FOR NEXT SESSION: Add in more closed chain.  Quad and hip strength.  Assess response to HEP; progress  therex  as indicated; use of modalities, manual therapy; and TPDN as indicated.     Gasper Karst, PTA 02/15/24 3:32 PM Phone: [phone] Fax: [phone]

## 2024-02-17 NOTE — Therapy (Signed)
 OUTPATIENT PHYSICAL THERAPY LOWER EXTREMITY TREATMENT   Patient Name: Gail Gonzalez MRN: [id number] DOB:[birth year], 63 y.o., female Today's Date: 02/18/2024  END OF SESSION:  PT End of Session - 02/18/24 0956     Visit Number 8    Number of Visits 17    Date for PT Re-Evaluation 03/25/24    Authorization Type [employer]    Authorization - Visit Number 8    Authorization - Number of Visits 27    PT Start Time [phone]    PT Stop Time 1014    PT Time Calculation (min) 40 min    Activity Tolerance Patient tolerated treatment well    Behavior During Therapy [facility] for tasks assessed/performed                    Past Medical History:  Diagnosis Date   Anemia    Anxiety    Arthritis    Cardiac arrhythmia due to congenital heart disease    Cataract    Chronic back pain    Colon polyps 04/24/2013   Diverticulosis 02/24/2013   Endometriosis    on lap surgery. seen by Dr. Adelene Homer   Fatty liver    GERD (gastroesophageal reflux disease)    H. pylori infection    Heart murmur    Hemorrhoids    Internal hemorrhoids    Rectal bleeding    Thyroid  disease 10/27/2010   nodule   Tubular adenoma of colon    Past Surgical History:  Procedure Laterality Date   BIOPSY THYROID   2013   BREAST BIOPSY Left    DIAGNOSTIC LAPAROSCOPY  1990   endometriosis   Patient Active Problem List   Diagnosis Date Noted   Hot flashes, menopausal 10/12/2023   Chronic pain of left knee 06/18/2023   Rhinorrhea 06/18/2023   Pain of right lower extremity 01/06/2023   Fatigue 11/25/2022   Grief counseling 02/02/2022   Anxiety disorder 08/18/2020   Uterine leiomyoma 11/18/2017   Preventative health care 10/11/2016   Tobacco use disorder 10/10/2016   Dyslipidemia 10/10/2016   Hyperglycemia 10/10/2016   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   Seborrheic keratosis 03/06/2016   Back pain 03/06/2016   Vitamin D  deficiency 06/11/2015   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  PERTINENT HISTORY: See PMH  PAIN:  Are you having pain? Yes: NPRS scale: 4/10 Pain location: L knee Pain description: Ache Aggravating factors: Bending the knee Relieving factors: Pain medication, cold pack  PRECAUTIONS: Chronic back pain  RED FLAGS: None   WEIGHT BEARING RESTRICTIONS: No WBAT  FALLS:  Has patient fallen in last 6 months? 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 Eventually being able  to dance.  NEXT MD VISIT: To call and schedule a post surgical appt c Dr Yvonne Hering  OBJECTIVE:  Note: Objective measures were completed at Evaluation unless otherwise noted.  DIAGNOSTIC FINDINGS:  10/30/23 MRI L knee IMPRESSION: 1. Complex tear of the anterior horn of the lateral meniscus and peripheral meniscal extrusion. 14 mm cystic mass anterior to the anterior horn lateral meniscus likely reflecting a parameniscal cyst. 2. High-grade partial-thickness cartilage loss of the medial patellar facet and patellar apex with mild subchondral marrow edema in the patellar apex. 3. Mild partial-thickness cartilage loss of the  weight-bearing lateral femorotibial compartment.  PATIENT SURVEYS:  LEFS 15/80 = 19% or 81% limitation  COGNITION: Overall cognitive status: Within functional limits for tasks assessed     SENSATION: WFL  EDEMA:  Present- covered by post surgical dressing  MUSCLE LENGTH: Hamstrings: Right NT deg; Left NT deg Andy Bannister test: Right NT deg; Left NT deg  POSTURE: rounded shoulders and forward head  PALPATION: TTP peri L knee. Wrapped with ace bandage and dressing in place  LOWER EXTREMITY ROM:  Active ROM Right eval Left eval LT 01/28/24 LT 02/10/24  Hip flexion      Hip extension      Hip abduction      Hip adduction      Hip internal rotation      Hip external rotation      Knee flexion  90 120 130  Knee extension  4 lacking 2 lacking 2 lacking  Ankle dorsiflexion      Ankle plantarflexion      Ankle inversion      Ankle eversion       (Blank rows = not tested)  LOWER EXTREMITY MMT:  MMT Right eval Left eval  Hip flexion 5 4  Hip extension    Hip abduction 5 4  Hip adduction    Hip internal rotation    Hip external rotation 5 4  Knee flexion 5 NT  Knee extension 5 NT  Ankle dorsiflexion    Ankle plantarflexion    Ankle inversion    Ankle eversion     (Blank rows = not tested)  LOWER EXTREMITY SPECIAL TESTS:  NT  FUNCTIONAL TESTS: Assess when pt is able to walk without an assist device 5 times sit to stand: TBA 2 minute walk test: TBA 12.6 sec and 592 feet   GAIT: Distance walked: 200' Assistive device utilized: Crutches Level of assistance: Modified independence Comments: Swing through pattern                                                                                                                   TREATMENT DATE:  [facility] Adult PT Treatment:                    5 wks s/p sx                           DATE: 02/18/24 Therapeutic Exercise:  SLR c QS x 15 SL hip abd x15 Supine Blue band SL clam x15 Banded Bridge x15  Seated h/s  stretch Therapeutic Activity: Nustep L5 UE/LE x 6 minutes Standing TKE 2x15 Heel toe raises 20x Wall slides 45d 2x8 Banded side steps GTB at counter STS without UE  2 x 10 SL standing Modalities:  Ice pack x 10 min post tx, left knee  [facility] Adult PT Treatment:                                                DATE: 02/15/24 Therapeutic Exercise: QS x 10 SLR x 15  Supine Blue band clam Banded Bridge 10 x 2  Seated h/s stretch  Therapeutic Activity: Nustep L5 UE/LE x 6 minutes Heel toe raises Marching on Airex without UE Tandem on AIREX LLE back SLS on foam oval with hip abdct and ext x 10 each bilat  STS without UE x 10 Modalities:  Ice pack x 10 min post tx, left knee   [facility] Adult PT Treatment:                4 wks s/p Sx                                DATE: 02/10/24 Therapeutic Activity: NuStep 6 min L5 UE/LE SLR c quad set x15 Hip semi circle x 30 sec  Supine clam GTB x 15  Supine banded bridge GTB x 15 Bridge with clam x 10 each side  Bridge with march 2 x 10  Long sit L hamstring stretch 3x30 STS c airex on mat table x10 SLS L LE s and c opp knee lifts for challenge Heel raise/toe lifts x 15  High knee march standing on Airex  Updated HEP   PATIENT EDUCATION:  Education details: Eval findings, POC, HEP, self care  Person educated: Patient Education method: Explanation, Demonstration, Tactile cues, Verbal cues, and Handouts Education comprehension: verbalized understanding, returned demonstration, verbal cues required, and tactile cues required  HOME EXERCISE PROGRAM: Access Code: [email or web address] URL: [email or web address] Date: 02/10/2024 Prepared by: Liborio Reeds  Exercises - Supine Heel Slide  - 2-3 x daily - 7 x weekly - 10 reps - 5 hold - Supine Quad Set  - 2-3 x daily - 7 x weekly - 1 sets - 10 reps - 5 hold - Active Straight Leg Raise with Quad Set  - 2-3 x daily - 7 x weekly - 1 sets - 10 reps - 5 hold - Sidelying Hip Abduction  - 2-3 x daily  - 7 x weekly - 1 sets - 10 reps - 3 hold - Hooklying Clamshell with Resistance  - 2-3 x daily - 7 x weekly - 1 sets - 10 reps - 3 hold - Supine Hip Adduction Isometric with Ball  - 2-3 x daily - 7 x weekly - 1 sets - 10 reps - 3 hold - Supine Knee Extension Strengthening  - 2-3 x daily - 7 x weekly - 1 sets - 10 reps - 3 hold - Supine Single Leg Ankle Pumps  - 2 x daily - 7 x weekly - 1 sets - 10 reps - Supine Ankle Circles  - 2 x daily - 7 x weekly - 1 sets - 10 reps - Seated Table  Hamstring Stretch  - 1 x daily - 7 x weekly - 1 sets - 3-5 reps - 30 hold  ASSESSMENT:  CLINICAL IMPRESSION: Pt participated in PT for L knee/LE strengthening. Continue to progressively increase demand in the [facility]. Pt presented with some L knee pain, but tolerated the prescribed today without adverse effects. A cold pack was applied at the EOS for symptom management. Pt is progressing appropriately s/p menisectomy. Pt will continue to benefit from skilled PT to address impairments for improved L LE function.  OBJECTIVE IMPAIRMENTS: Abnormal gait, decreased activity tolerance, difficulty walking, decreased ROM, decreased strength, increased edema, and pain.   ACTIVITY LIMITATIONS: carrying, lifting, bending, sitting, standing, squatting, sleeping, stairs, transfers, bed mobility, bathing, toileting, dressing, locomotion level, and caring for others  PARTICIPATION LIMITATIONS: meal prep, cleaning, laundry, driving, shopping, community activity, and occupation  PERSONAL FACTORS: Past/current experiences and Time since onset of injury/illness/exacerbation Chronic back pain, are also affecting patient's functional outcome.   REHAB POTENTIAL: Good  CLINICAL DECISION MAKING: Evolving/moderate complexity  EVALUATION COMPLEXITY: Moderate   GOALS:  SHORT TERM GOALS: Target date: 02/12/24 Pt will be Ind in an initial HEP  Baseline: started Goal status: MET  2.  Pt will voice understanding of measures to assist in  pain and swelling reduction  Baseline: started Goal status: MET   3.  L knee AROM will increase to 0-115d for good functional outcome Baseline: 4-90, 0-4-126 flexion  Goal status: ongoing , met for flexion   LONG TERM GOALS: Target date: 03/25/24  Pt will be Ind in a final HEP to maintain achieved LOF  Baseline: started Goal status: ongoing   2.  L LE will demonstrated 5/5 strength for improved function with ADLs Baseline: see flow sheet Goal status: ongoing   3.  L knee will demonstrate 0-125d of AROM for appropriate function with sitting, ascending/descending steps, ambulation Baseline: 4-90d Goal status: ongoing   4.  Improve 5xSTS by MCID of 5" and by MCID of 77ft as indication of improved functional mobility  Baseline: 12.6 sec and 592 feet  Goal status: ongoing   5.  Pt will be able to ascend and descend 18 steps c HR assist for home and community mobility Baseline: assist by crutches , does one step at a time  Goal status: ongoing   6.  Pt will be able to walk with a normalized gait pattern 1000' for community mobility and to progress pt toward return to dance Baseline: Assist by crutches Goal status: MET    PLAN:  PT FREQUENCY: 2x/week  PT DURATION: 8 weeks  PLANNED INTERVENTIONS: 97164- PT Re-evaluation, 97110-Therapeutic exercises, 97530- Therapeutic activity, [id number]- Neuromuscular re-education, 97535- Self Care, 08657- Manual therapy, [id number]- Gait training, [id number]- Electrical stimulation (unattended), 97016- Vasopneumatic device, Patient/Family education, Balance training, Stair training, Taping, Dry Needling, Joint mobilization, Cryotherapy, and Moist heat  PLAN FOR NEXT SESSION: Add in more closed chain.  Quad and hip strength.  Assess response to HEP; progress therex as indicated; use of modalities, manual therapy; and TPDN as indicated.     Ariston Grandison MS, PT 02/18/24 10:22 AM

## 2024-02-18 ENCOUNTER — Ambulatory Visit

## 2024-02-18 DIAGNOSIS — M5459 Other low back pain: Secondary | ICD-10-CM | POA: Diagnosis not present

## 2024-02-18 DIAGNOSIS — M6281 Muscle weakness (generalized): Secondary | ICD-10-CM | POA: Diagnosis not present

## 2024-02-19 NOTE — Therapy (Deleted)
 OUTPATIENT PHYSICAL THERAPY LOWER EXTREMITY TREATMENT   Patient Name: Gail Gonzalez MRN: [id number] DOB:1960/11/26, 63 y.o., female Today's Date: 02/19/2024  END OF SESSION:           Past Medical History:  Diagnosis Date   Anemia    Anxiety    Arthritis    Cardiac arrhythmia due to congenital heart disease    Cataract    Chronic back pain    Colon polyps 04/24/2013   Diverticulosis 02/24/2013   Endometriosis    on lap surgery. seen by Dr. Adelene Homer   Fatty liver    GERD (gastroesophageal reflux disease)    H. pylori infection    Heart murmur    Hemorrhoids    Internal hemorrhoids    Rectal bleeding    Thyroid  disease 10/27/2010   nodule   Tubular adenoma of colon    Past Surgical History:  Procedure Laterality Date   BIOPSY THYROID   2013   BREAST BIOPSY Left    DIAGNOSTIC LAPAROSCOPY  1990   endometriosis   Patient Active Problem List   Diagnosis Date Noted   Hot flashes, menopausal 10/12/2023   Chronic pain of left knee 06/18/2023   Rhinorrhea 06/18/2023   Pain of right lower extremity 01/06/2023   Fatigue 11/25/2022   Grief counseling 02/02/2022   Anxiety disorder 08/18/2020   Uterine leiomyoma 11/18/2017   Preventative health care 10/11/2016   Tobacco use disorder 10/10/2016   Dyslipidemia 10/10/2016   Hyperglycemia 10/10/2016   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  Rationale for Evaluation and Treatment: Rehabilitation  ONSET DATE:  01/18/24  SUBJECTIVE:   SUBJECTIVE STATEMENT: Pt reports ant/lat L knee more consistently related to increased time on her feet.  PERTINENT HISTORY: See PMH  PAIN:  Are you having pain? Yes: NPRS scale: 4/10 Pain location: L knee Pain description: Ache Aggravating factors: Bending the knee Relieving factors: Pain medication, cold pack  PRECAUTIONS: Chronic back pain  RED FLAGS: None   WEIGHT BEARING RESTRICTIONS: No WBAT  FALLS:  Has patient fallen in last 6 months? 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 Eventually being able to dance.  NEXT MD VISIT: To call and schedule a post surgical appt c Dr Yvonne Hering  OBJECTIVE:  Note: Objective measures were completed at Evaluation unless otherwise noted.  DIAGNOSTIC FINDINGS:  10/30/23 MRI L knee IMPRESSION: 1. Complex tear of the anterior horn of the lateral meniscus and peripheral meniscal extrusion. 14 mm cystic mass anterior to the anterior horn lateral meniscus likely reflecting a parameniscal cyst. 2. High-grade partial-thickness cartilage loss of the medial patellar facet and patellar apex with mild subchondral marrow edema in the patellar apex. 3. Mild partial-thickness cartilage loss of the weight-bearing lateral femorotibial compartment.  PATIENT SURVEYS:  LEFS  15/80 = 19% or 81% limitation  COGNITION: Overall cognitive status: Within functional limits for tasks assessed     SENSATION: WFL  EDEMA:  Present- covered by post surgical dressing  MUSCLE LENGTH: Hamstrings: Right NT deg; Left NT deg Andy Bannister test: Right NT deg; Left NT deg  POSTURE: rounded shoulders and forward head  PALPATION: TTP peri L knee. Wrapped  with ace bandage and dressing in place  LOWER EXTREMITY ROM:  Active ROM Right eval Left eval LT 01/28/24 LT 02/10/24  Hip flexion      Hip extension      Hip abduction      Hip adduction      Hip internal rotation      Hip external rotation      Knee flexion  90 120 130  Knee extension  4 lacking 2 lacking 2 lacking  Ankle dorsiflexion      Ankle plantarflexion      Ankle inversion      Ankle eversion       (Blank rows = not tested)  LOWER EXTREMITY MMT:  MMT Right eval Left eval  Hip flexion 5 4  Hip extension    Hip abduction 5 4  Hip adduction    Hip internal rotation    Hip external rotation 5 4  Knee flexion 5 NT  Knee extension 5 NT  Ankle dorsiflexion    Ankle plantarflexion    Ankle inversion    Ankle eversion     (Blank rows = not tested)  LOWER EXTREMITY SPECIAL TESTS:  NT  FUNCTIONAL TESTS: Assess when pt is able to walk without an assist device 5 times sit to stand: TBA 2 minute walk test: TBA 12.6 sec and 592 feet   GAIT: Distance walked: 200' Assistive device utilized: Crutches Level of assistance: Modified independence Comments: Swing through pattern                                                                                                                   TREATMENT DATE:    [facility] Adult PT Treatment:                                                DATE: 02/19/24 Therapeutic Exercise: *** Manual Therapy: *** Neuromuscular re-ed: *** Therapeutic Activity: *** Modalities: *** Self Care: ***  [facility] Adult PT Treatment:                    5 wks s/p sx                           DATE: 02/18/24 Therapeutic Exercise: SLR c QS x 15 SL hip abd x15 Supine Blue band SL clam x15 Banded Bridge x15  Seated h/s stretch Therapeutic Activity: Nustep L5 UE/LE x 6 minutes Standing TKE 2x15 Heel toe  raises 20x Wall slides 45d 2x8 Banded side steps GTB at counter STS without UE  2 x 10 SL standing Modalities:  Ice pack x 10 min post tx,  left knee  [facility] Adult PT Treatment:                                                DATE: 02/15/24 Therapeutic Exercise: QS x 10 SLR x 15  Supine Blue band clam Banded Bridge 10 x 2  Seated h/s stretch  Therapeutic Activity: Nustep L5 UE/LE x 6 minutes Heel toe raises Marching on Airex without UE Tandem on AIREX LLE back SLS on foam oval with hip abdct and ext x 10 each bilat  STS without UE x 10 Modalities:  Ice pack x 10 min post tx, left knee   [facility] Adult PT Treatment:                4 wks s/p Sx                                DATE: 02/10/24 Therapeutic Activity: NuStep 6 min L5 UE/LE SLR c quad set x15 Hip semi circle x 30 sec  Supine clam GTB x 15  Supine banded bridge GTB x 15 Bridge with clam x 10 each side  Bridge with march 2 x 10  Long sit L hamstring stretch 3x30 STS c airex on mat table x10 SLS L LE s and c opp knee lifts for challenge Heel raise/toe lifts x 15  High knee march standing on Airex  Updated HEP   PATIENT EDUCATION:  Education details: Eval findings, POC, HEP, self care  Person educated: Patient Education method: Explanation, Demonstration, Tactile cues, Verbal cues, and Handouts Education comprehension: verbalized understanding, returned demonstration, verbal cues required, and tactile cues required  HOME EXERCISE PROGRAM: Access Code: [email or web address] URL: [email or web address] Date: 02/10/2024 Prepared by: Liborio Reeds  Exercises - Supine Heel Slide  - 2-3 x daily - 7 x weekly - 10 reps - 5 hold - Supine Quad Set  - 2-3 x daily - 7 x weekly - 1 sets - 10 reps - 5 hold - Active Straight Leg Raise with Quad Set  - 2-3 x daily - 7 x weekly - 1 sets - 10 reps - 5 hold - Sidelying Hip Abduction  - 2-3 x daily - 7 x weekly - 1 sets - 10 reps - 3 hold - Hooklying Clamshell with Resistance  - 2-3 x daily - 7 x weekly - 1 sets - 10 reps - 3 hold - Supine Hip Adduction Isometric with Ball  - 2-3 x daily - 7 x weekly - 1 sets - 10 reps - 3  hold - Supine Knee Extension Strengthening  - 2-3 x daily - 7 x weekly - 1 sets - 10 reps - 3 hold - Supine Single Leg Ankle Pumps  - 2 x daily - 7 x weekly - 1 sets - 10 reps - Supine Ankle Circles  - 2 x daily - 7 x weekly - 1 sets - 10 reps - Seated Table Hamstring Stretch  - 1 x daily - 7 x weekly - 1 sets - 3-5 reps - 30 hold  ASSESSMENT:  CLINICAL IMPRESSION: Pt participated in PT for L knee/LE strengthening. Continue to  progressively increase demand in the [facility]. Pt presented with some L knee pain, but tolerated the prescribed today without adverse effects. A cold pack was applied at the EOS for symptom management. Pt is progressing appropriately s/p menisectomy. Pt will continue to benefit from skilled PT to address impairments for improved L LE function.  OBJECTIVE IMPAIRMENTS: Abnormal gait, decreased activity tolerance, difficulty walking, decreased ROM, decreased strength, increased edema, and pain.   ACTIVITY LIMITATIONS: carrying, lifting, bending, sitting, standing, squatting, sleeping, stairs, transfers, bed mobility, bathing, toileting, dressing, locomotion level, and caring for others  PARTICIPATION LIMITATIONS: meal prep, cleaning, laundry, driving, shopping, community activity, and occupation  PERSONAL FACTORS: Past/current experiences and Time since onset of injury/illness/exacerbation Chronic back pain, are also affecting patient's functional outcome.   REHAB POTENTIAL: Good  CLINICAL DECISION MAKING: Evolving/moderate complexity  EVALUATION COMPLEXITY: Moderate   GOALS:  SHORT TERM GOALS: Target date: 02/12/24 Pt will be Ind in an initial HEP  Baseline: started Goal status: MET  2.  Pt will voice understanding of measures to assist in pain and swelling reduction  Baseline: started Goal status: MET   3.  L knee AROM will increase to 0-115d for good functional outcome Baseline: 4-90, 0-4-126 flexion  Goal status: ongoing , met for flexion   LONG TERM GOALS:  Target date: 03/25/24  Pt will be Ind in a final HEP to maintain achieved LOF  Baseline: started Goal status: ongoing   2.  L LE will demonstrated 5/5 strength for improved function with ADLs Baseline: see flow sheet Goal status: ongoing   3.  L knee will demonstrate 0-125d of AROM for appropriate function with sitting, ascending/descending steps, ambulation Baseline: 4-90d Goal status: ongoing   4.  Improve 5xSTS by MCID of 5" and by MCID of 61ft as indication of improved functional mobility  Baseline: 12.6 sec and 592 feet  Goal status: ongoing   5.  Pt will be able to ascend and descend 18 steps c HR assist for home and community mobility Baseline: assist by crutches , does one step at a time  Goal status: ongoing   6.  Pt will be able to walk with a normalized gait pattern 1000' for community mobility and to progress pt toward return to dance Baseline: Assist by crutches Goal status: MET    PLAN:  PT FREQUENCY: 2x/week  PT DURATION: 8 weeks  PLANNED INTERVENTIONS: 97164- PT Re-evaluation, 97110-Therapeutic exercises, 97530- Therapeutic activity, [id number]- Neuromuscular re-education, 97535- Self Care, 29562- Manual therapy, [id number]- Gait training, [phone]- Electrical stimulation (unattended), 97016- Vasopneumatic device, Patient/Family education, Balance training, Stair training, Taping, Dry Needling, Joint mobilization, Cryotherapy, and Moist heat  PLAN FOR NEXT SESSION: Add in more closed chain.  Quad and hip strength.  Assess response to HEP; progress therex as indicated; use of modalities, manual therapy; and TPDN as indicated.     Allen Ralls MS, PT 02/19/24 11:49 AM

## 2024-02-22 ENCOUNTER — Ambulatory Visit: Payer: Self-pay | Admitting: Physical Therapy

## 2024-02-22 ENCOUNTER — Ambulatory Visit: Admitting: Physical Therapy

## 2024-02-22 ENCOUNTER — Encounter: Payer: Self-pay | Admitting: Physical Therapy

## 2024-02-22 ENCOUNTER — Telehealth: Payer: Self-pay | Admitting: Physical Therapy

## 2024-02-22 DIAGNOSIS — M5459 Other low back pain: Secondary | ICD-10-CM

## 2024-02-22 DIAGNOSIS — M6281 Muscle weakness (generalized): Secondary | ICD-10-CM | POA: Diagnosis not present

## 2024-02-22 NOTE — Therapy (Signed)
 OUTPATIENT PHYSICAL THERAPY LOWER EXTREMITY TREATMENT   Patient Name: Gail Gonzalez MRN: [id number] DOB:1961-09-15, 63 y.o., female Today's Date: 02/22/2024  END OF SESSION:  PT End of Session - 02/22/24 1335     Visit Number 9    Number of Visits 17    Date for PT Re-Evaluation 03/25/24    Authorization Type [employer]    Authorization - Visit Number 9    Authorization - Number of Visits 27    PT Start Time 1332    PT Stop Time 1415    PT Time Calculation (min) 43 min    Activity Tolerance Patient tolerated treatment well    Behavior During Therapy WFL for tasks assessed/performed                     Past Medical History:  Diagnosis Date   Anemia    Anxiety    Arthritis    Cardiac arrhythmia due to congenital heart disease    Cataract    Chronic back pain    Colon polyps 04/24/2013   Diverticulosis 02/24/2013   Endometriosis    on lap surgery. seen by Dr. Adelene Homer   Fatty liver    GERD (gastroesophageal reflux disease)    H. pylori infection    Heart murmur    Hemorrhoids    Internal hemorrhoids    Rectal bleeding    Thyroid  disease 10/27/2010   nodule   Tubular adenoma of colon    Past Surgical History:  Procedure Laterality Date   BIOPSY THYROID   2013   BREAST BIOPSY Left    DIAGNOSTIC LAPAROSCOPY  1990   endometriosis   Patient Active Problem List   Diagnosis Date Noted   Hot flashes, menopausal 10/12/2023   Chronic pain of left knee 06/18/2023   Rhinorrhea 06/18/2023   Pain of right lower extremity 01/06/2023   Fatigue 11/25/2022   Grief counseling 02/02/2022   Anxiety disorder 08/18/2020   Uterine leiomyoma 11/18/2017   Preventative health care 10/11/2016   Tobacco use disorder 10/10/2016   Dyslipidemia 10/10/2016   Hyperglycemia 10/10/2016   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   Seborrheic keratosis 03/06/2016   Back pain 03/06/2016   Vitamin D  deficiency 06/11/2015   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 Pt was driving today for almost 3 hrs.  Pt wraps her knee with ACE wrap and it feels better.   PERTINENT HISTORY: See PMH  PAIN:  Are you having pain? Yes: NPRS scale: 0/10 Pain location: L knee Pain description: Ache Aggravating factors: Bending the knee Relieving factors: Pain medication, cold pack  PRECAUTIONS: Chronic back pain  RED FLAGS: None   WEIGHT BEARING RESTRICTIONS: No WBAT  FALLS:  Has patient fallen in last 6 months? 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 Eventually being able to dance.  NEXT MD VISIT: To call and schedule a post surgical appt c Dr Yvonne Hering  OBJECTIVE:  Note: Objective measures were completed at Evaluation unless otherwise noted.  DIAGNOSTIC FINDINGS:  10/30/23 MRI L knee IMPRESSION: 1. Complex tear of the anterior horn of the lateral meniscus and peripheral meniscal extrusion. 14 mm cystic mass anterior to the anterior horn lateral meniscus likely reflecting a parameniscal cyst. 2. High-grade partial-thickness cartilage loss of the medial patellar facet and patellar apex with mild subchondral marrow edema in the patellar apex. 3. Mild  partial-thickness cartilage loss of the weight-bearing lateral femorotibial compartment.  PATIENT SURVEYS:  LEFS 15/80 = 19% or 81% limitation  COGNITION: Overall cognitive status: Within functional limits for tasks assessed     SENSATION: WFL  EDEMA:  Present- covered by post surgical dressing  MUSCLE LENGTH: Hamstrings: Right NT deg; Left NT deg Andy Bannister test: Right NT deg; Left NT deg  POSTURE: rounded shoulders and forward head  PALPATION: TTP peri L knee. Wrapped with ace bandage and dressing in place  LOWER EXTREMITY ROM:  Active ROM Right eval Left eval LT 01/28/24 LT 02/10/24  Hip flexion      Hip extension      Hip abduction      Hip adduction      Hip internal rotation      Hip external rotation      Knee flexion  90 120 130  Knee extension  4 lacking 2 lacking 2 lacking  Ankle dorsiflexion      Ankle plantarflexion      Ankle inversion      Ankle eversion       (Blank rows = not tested)  LOWER EXTREMITY MMT:  MMT Right eval Left eval  Hip flexion 5 4  Hip extension    Hip abduction 5 4  Hip adduction    Hip internal rotation    Hip external rotation 5 4  Knee flexion 5 NT  Knee extension 5 NT  Ankle dorsiflexion    Ankle plantarflexion    Ankle inversion    Ankle eversion     (Blank rows = not tested)  LOWER EXTREMITY SPECIAL TESTS:  NT  FUNCTIONAL TESTS: Assess when pt is able to walk without an assist device 5 times sit to stand: TBA 2 minute walk test: TBA 12.6 sec and 592 feet   GAIT: Distance walked: 200' Assistive device utilized: Crutches Level of assistance: Modified independence Comments: Swing through pattern                                                                                                                   TREATMENT DATE:    [facility] Adult PT Treatment:  DATE: 02/22/24 Therapeutic Exercise: Treadmill, 2.2 mph , 0% grade, 8 min  4 way hip:  SLR, 3 lbs x 10 each  flexion, abduction, extension and adduction  Bent knee extension 3 lbs x 10  Plank on forearms 10 sec x 5  High Step up 8 inch step (runner's) x 10 Step down 3 inch x 10, difficult Step up and down, lateral 3 inch  Knee extension (OMEGA) 15 lbs x 10 then added 5 lbs x 10  Leg press (OMEGA) 55 lbs x 15, then single leg  Cold pack 10 min L knee    [facility] Adult PT Treatment:                    5 wks s/p sx                           DATE: 02/18/24 Therapeutic Exercise: SLR c QS x 15 SL hip abd x15 Supine Blue band SL clam x15 Banded Bridge x15  Seated h/s stretch Therapeutic Activity: Nustep L5 UE/LE x 6 minutes Standing TKE 2x15 Heel toe raises 20x Wall slides 45d 2x8 Banded side steps GTB at counter STS without UE  2 x 10 SL standing Modalities:  Ice pack x 10 min post tx, left knee  [facility] Adult PT Treatment:                                                DATE: 02/15/24 Therapeutic Exercise: QS x 10 SLR x 15  Supine Blue band clam Banded Bridge 10 x 2  Seated h/s stretch  Therapeutic Activity: Nustep L5 UE/LE x 6 minutes Heel toe raises Marching on Airex without UE Tandem on AIREX LLE back SLS on foam oval with hip abdct and ext x 10 each bilat  STS without UE x 10 Modalities:  Ice pack x 10 min post tx, left knee   [facility] Adult PT Treatment:                4 wks s/p Sx                                DATE: 02/10/24 Therapeutic Activity: NuStep 6 min L5 UE/LE SLR c quad set x15 Hip semi circle x 30 sec  Supine clam GTB x 15  Supine banded bridge GTB x 15 Bridge with clam x 10 each side  Bridge with march 2 x 10  Long sit L hamstring stretch 3x30 STS c airex on mat table x10 SLS L LE s and c opp knee lifts for challenge Heel raise/toe lifts x 15  High knee march standing on Airex  Updated HEP   PATIENT EDUCATION:  Education details: Eval findings, POC, HEP, self care  Person educated: Patient Education method: Explanation, Demonstration, Tactile cues, Verbal  cues, and Handouts Education comprehension: verbalized understanding, returned demonstration, verbal cues required, and tactile cues required  HOME EXERCISE PROGRAM: Access Code: [email or web address] URL: [email or web address] Date: 02/10/2024 Prepared by: Liborio Reeds  Exercises - Supine Heel Slide  - 2-3 x daily - 7 x weekly - 10 reps - 5 hold - Supine Quad Set  - 2-3 x daily - 7 x weekly - 1 sets - 10 reps - 5 hold - Active  Straight Leg Raise with Quad Set  - 2-3 x daily - 7 x weekly - 1 sets - 10 reps - 5 hold - Sidelying Hip Abduction  - 2-3 x daily - 7 x weekly - 1 sets - 10 reps - 3 hold - Hooklying Clamshell with Resistance  - 2-3 x daily - 7 x weekly - 1 sets - 10 reps - 3 hold - Supine Hip Adduction Isometric with Ball  - 2-3 x daily - 7 x weekly - 1 sets - 10 reps - 3 hold - Supine Knee Extension Strengthening  - 2-3 x daily - 7 x weekly - 1 sets - 10 reps - 3 hold - Supine Single Leg Ankle Pumps  - 2 x daily - 7 x weekly - 1 sets - 10 reps - Supine Ankle Circles  - 2 x daily - 7 x weekly - 1 sets - 10 reps - Seated Table Hamstring Stretch  - 1 x daily - 7 x weekly - 1 sets - 3-5 reps - 30 hold  ASSESSMENT:  CLINICAL IMPRESSION: Patient demonstrates good knee control and stability with closed chain exercises.  She does have difficulty when doing step downs with minimal pain and weakness when standing back up.  Began to work with the weight machines in order to transition her back to her [employer].    Pt participated in PT for L knee/LE strengthening. Continue to progressively increase demand in the [facility]. Pt presented with some L knee pain, but tolerated the prescribed today without adverse effects. A cold pack was applied at the EOS for symptom management. Pt is progressing appropriately s/p menisectomy. Pt will continue to benefit from skilled PT to address impairments for improved L LE function.  OBJECTIVE IMPAIRMENTS: Abnormal gait, decreased activity  tolerance, difficulty walking, decreased ROM, decreased strength, increased edema, and pain.   ACTIVITY LIMITATIONS: carrying, lifting, bending, sitting, standing, squatting, sleeping, stairs, transfers, bed mobility, bathing, toileting, dressing, locomotion level, and caring for others  PARTICIPATION LIMITATIONS: meal prep, cleaning, laundry, driving, shopping, community activity, and occupation  PERSONAL FACTORS: Past/current experiences and Time since onset of injury/illness/exacerbation Chronic back pain, are also affecting patient's functional outcome.   REHAB POTENTIAL: Good  CLINICAL DECISION MAKING: Evolving/moderate complexity  EVALUATION COMPLEXITY: Moderate   GOALS:  SHORT TERM GOALS: Target date: 02/12/24 Pt will be Ind in an initial HEP  Baseline: started Goal status: MET  2.  Pt will voice understanding of measures to assist in pain and swelling reduction  Baseline: started Goal status: MET   3.  L knee AROM will increase to 0-115d for good functional outcome Baseline: 4-90, 0-4-126 flexion  Goal status: ongoing , met for flexion   LONG TERM GOALS: Target date: 03/25/24  Pt will be Ind in a final HEP to maintain achieved LOF  Baseline: started Goal status: ongoing   2.  L LE will demonstrated 5/5 strength for improved function with ADLs Baseline: see flow sheet Goal status: ongoing   3.  L knee will demonstrate 0-125d of AROM for appropriate function with sitting, ascending/descending steps, ambulation Baseline: 4-90d Goal status: ongoing   4.  Improve 5xSTS by MCID of 5" and by MCID of 33ft as indication of improved functional mobility  Baseline: 12.6 sec and 592 feet  Goal status: ongoing   5.  Pt will be able to ascend and descend 18 steps c HR assist for home and community mobility Baseline: assist by crutches , does one step  at a time  Goal status: ongoing   6.  Pt will be able to walk with a normalized gait pattern 1000' for community  mobility and to progress pt toward return to dance Baseline: Assist by crutches Goal status: MET    PLAN:  PT FREQUENCY: 2x/week  PT DURATION: 8 weeks  PLANNED INTERVENTIONS: 97164- PT Re-evaluation, 97110-Therapeutic exercises, 97530- Therapeutic activity, [id number]- Neuromuscular re-education, 97535- Self Care, 16109- Manual therapy, [id number]- Gait training, [phone]- Electrical stimulation (unattended), 97016- Vasopneumatic device, Patient/Family education, Balance training, Stair training, Taping, Dry Needling, Joint mobilization, Cryotherapy, and Moist heat  PLAN FOR NEXT SESSION: Add in more closed chain.  Quad and hip strength.  Assess response to HEP; progress therex as indicated; use of modalities, manual therapy; and TPDN as indicated.    Marci Setter, PT 02/22/24 2:28 PM Phone: [phone] Fax: [phone]

## 2024-02-24 ENCOUNTER — Encounter: Payer: Self-pay | Admitting: Student

## 2024-02-24 ENCOUNTER — Other Ambulatory Visit: Payer: Self-pay | Admitting: Student

## 2024-02-24 ENCOUNTER — Ambulatory Visit: Admitting: Student

## 2024-02-24 VITALS — BP 110/78 | HR 76 | Ht 68.0 in | Wt 166.0 lb

## 2024-02-24 DIAGNOSIS — B009 Herpesviral infection, unspecified: Secondary | ICD-10-CM

## 2024-02-24 DIAGNOSIS — Z7189 Other specified counseling: Secondary | ICD-10-CM

## 2024-02-24 MED ORDER — VALACYCLOVIR HCL 1 G PO TABS: 1000.0000 mg | ORAL_TABLET | Freq: Two times a day (BID) | ORAL | 1 refills | Status: DC

## 2024-02-24 NOTE — Patient Instructions (Signed)
 It was great to see you! Thank you for allowing me to participate in your care!  I recommend that you always bring your medications to each appointment as this makes it easy to ensure we are on the correct medications and helps us  not miss when refills are needed.  Our plans for today:  - Fever Blister (HSV) "Cold Sore"  Take Valacyclovir twice a day for seven days when symptoms begin  - Therapy Resources  I've attached a list of therapist for you to select from. Find one that works for you!  Make follow up appointment in 1 month    Therapy and Counseling Resources Most providers on this list will take Medicaid. Patients with commercial insurance or Medicare should contact their insurance company to get a list of in network providers.  [facility] (takes children) Location 1: [address] [place], [place] [address] Location 2: [address] [place], [place] [address] [phone]   [facility] (spanish speaking therapist available)(habla espanol)(take medicare and medicaid)  [address], [place], [place] [address], [place]  [email or web address] [phone]  [facility]ing [address] [place], [place] [address] [phone]  [facility]   [address], Suite [place], [place] [address]      [phone]  [facility] (spanish available) [address]  [place], [place] [address] [phone]  [facility] (take [facility] and medicare) [address]  [place], [place] [address]       [phone]     [facility] (virtual only) [phone]  [facility] [address] [place], [place] [phone]  [facility]:  [address] [place] [phone]  [facility]:  [address] [place] [phone]  [facility] (under & uninsured) [address]   [place]  [phone]    [email or web address]    [facility] [address]  [place]    [phone]  [facility] -[address]     Phone:  [phone]     [facility]-  [address]  [phone]   [facility] [address]      [place], [place] [phone]  [facility]: [address] [place], [place], [place]  [phone]   [facility] (Spanish therapist) [email or web address]  [address]   [place] [address]    [phone]    [facility]   [address],  [place], [place]  [phone]   [facility]  [address] [place]  [phone]  [facility]  [address] [place], [place]        [phone]  Open Access/Walk In Clinic under & uninsured  [facility]  [address] [place], [place] [phone] Crisis [phone]  [facility] [address],  (Spanish)   [address] , [place]: [phone]) 8:30 - 12; 1 - 2:30  [facility] [employer],  [address] [place], [place]    [phone]):8:30 - 12; 2 - 3PM  [facility] [employer],  [address]  [place]; [phone]):   Mon - Fri 8 AM - 5 PM  [facility] [address] [place]  MWF 12:30 to 3:00 or call to schedule an  appointment  [phone]  Specific Provider options Psychology Today  [email or web address]  click on find a therapist  enter your zip code left side and select or tailor a therapist for your specific need.   [facility] Provider Directory [email or web address]  (Medicaid)   Follow all drop down to find a provider  Social Support program [facility] [phone] or PhotoS[employer].pl [address], [place], [place] Recovery support and educational    24- Hour Availability:   [facility]  [address] [place], [place] [phone] Crisis [phone]  [facility] [phone]  [facility]  [phone]   [facility]  [phone] (after hours)  Therapeutic Alternative/Mobile Crisis   [phone]  [place]  National Suicide Hotline  [phone] Derrel Flies)  Call 911 or go to emergency room  [facility]  [phone]);  [place] and [employer]  [phone]); [place], [place], [place], [place], [place], [place], [place]  We are checking some labs today, I will call you if they are abnormal will send you a MyChart message or a letter if they are normal.  If you do not hear about your labs in the next 2 weeks please let us  know.  Take care and seek immediate care sooner if you develop any concerns.   Dr. Wilhemena Harbour, MD [facility] Medicine

## 2024-02-24 NOTE — Assessment & Plan Note (Addendum)
 Patient comes in for grief counseling.  Patient notes she has a history significant for childhood trauma, that she has never been able to address.  Patient appreciates that she tends to keep herself busy, and address or think about unpleasant memories, that she will have flashbacks to periodically.  Patient requesting therapy resources, and is on antidepressant.  Patient appreciates some low mood intermittently, but has no thoughts or concerns of suicidal ideation.  Patient appreciates she loves her life and has good support with her family.  Patient has some depression, and anxiety, but is taking Lexapro , and does not want to increase dosage.  Patient also taking Atarax , and feels it helps.  Will give therapy resources the patient have close follow-up. - Therapy resources given - Continue Lexapro  20 mg daily - Continue Atarax  as needed 3 times daily - Follow-up 1 month

## 2024-02-24 NOTE — Assessment & Plan Note (Addendum)
 Patient comes in for follow-up of cold sores.  Patient appreciates that she continues to get cold sores, is wondering if there is a medication that can help make them go away sooner.  Patient presently has cold sore on her lip, that is in stage of healing.  Will recommend patient start valacyclovir twice daily x 7 days, when symptoms start. - Valacyclovir 1000 mg twice daily x 7 days, when symptoms began

## 2024-02-24 NOTE — Progress Notes (Signed)
 SUBJECTIVE:   CHIEF COMPLAINT / HPI:   HSV She experiences painful canker sores and has been using Campopanique for treatment, which aids in healing. She has never had canker sores before and is uncertain if they are related to a vitamin deficiency. She recalls using Zovirax in the past for similar issues.   Therapy She wants to talk to a therapist due to childhood trauma and acknowledges feeling depressed and down at times. She describes herself as a 'workaholic,' constantly needing to stay busy, which she attributes to her past trauma. Her children have encouraged her to slow down and spend more time talking with them. She works long hours and does not stop until late at night. She has been married for 25 years and her husband has been supportive, encouraging her to seek therapy. She has not shared her feelings of anxiety with him yet.  She is currently taking Lexapro  (escitalopram ) 20 mg and has a prescription for hydroxyzine , which she uses occasionally. She prefers not to increase her medication dosage as she does not want to feel like a 'zombie.' No thoughts of self-harm or suicidal ideation. She loves life and has a lot to live for, including her children, grandchildren, and great-grandchildren.  PERTINENT  PMH / PSH:   OBJECTIVE:  BP 110/78   Pulse 76   Ht 5\' 8"  (1.727 m)   Wt 166 lb (75.3 kg)   SpO2 99%   BMI 25.24 kg/m  Physical Exam HENT:     Mouth/Throat:     Mouth: Oral lesions present.      Comments: Healing cold sore located on lateral lower lip Psychiatric:        Mood and Affect: Mood normal. Affect is tearful.        Speech: Speech normal.        Behavior: Behavior normal. Behavior is not aggressive, withdrawn or combative. Behavior is cooperative.        Thought Content: Thought content normal. Thought content does not include suicidal ideation. Thought content does not include suicidal plan.      ASSESSMENT/PLAN:   Assessment & Plan Grief  counseling Patient comes in for grief counseling.  Patient notes she has a history significant for childhood trauma, that she has never been able to address.  Patient appreciates that she tends to keep herself busy, and address or think about unpleasant memories, that she will have flashbacks to periodically.  Patient requesting therapy resources, and is on antidepressant.  Patient appreciates some low mood intermittently, but has no thoughts or concerns of suicidal ideation.  Patient appreciates she loves her life and has good support with her family.  Patient has some depression, and anxiety, but is taking Lexapro , and does not want to increase dosage.  Patient also taking Atarax , and feels it helps.  Will give therapy resources the patient have close follow-up. 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  Patient appreciates that she continues to get cold sores, is wondering if there is a medication that can help make them go away sooner.  Patient presently has cold sore on her lip, that is in stage of healing.  Will recommend patient start valacyclovir twice daily x 7 days, when symptoms start. - Valacyclovir 1000 mg twice daily x 7 days, when symptoms began No follow-ups on file. Wilhemena Harbour, MD 02/24/2024, 9:33 AM PGY-3,  [facility]

## 2024-02-25 NOTE — Therapy (Signed)
 OUTPATIENT PHYSICAL THERAPY LOWER EXTREMITY TREATMENT   Patient Name: Gail Gonzalez MRN: [id number] DOB:10/09/1961, 63 y.o., female Today's Date: 02/26/2024  END OF SESSION:  PT End of Session - 02/26/24 1049     Visit Number 10    Number of Visits 17    Date for PT Re-Evaluation 03/25/24    Authorization Type [employer]    Authorization - Visit Number 10    Authorization - Number of Visits 27    PT Start Time 1018    PT Stop Time 1110    PT Time Calculation (min) 52 min    Activity Tolerance Patient tolerated treatment well    Behavior During Therapy WFL for tasks assessed/performed                      Past Medical History:  Diagnosis Date   Anemia    Anxiety    Arthritis    Cardiac arrhythmia due to congenital heart disease    Cataract    Chronic back pain    Colon polyps 04/24/2013   Diverticulosis 02/24/2013   Endometriosis    on lap surgery. seen by Dr. Adelene Homer   Fatty liver    GERD (gastroesophageal reflux disease)    H. 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   Hot flashes, menopausal 10/12/2023   Chronic pain of left knee 06/18/2023   Rhinorrhea 06/18/2023   Pain of right lower extremity 01/06/2023   Fatigue 11/25/2022   Grief counseling 02/02/2022   Anxiety disorder 08/18/2020   Uterine leiomyoma 11/18/2017   Preventative health care 10/11/2016   Tobacco use disorder 10/10/2016   Dyslipidemia 10/10/2016   Hyperglycemia 10/10/2016   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   Seborrheic keratosis 03/06/2016   Back pain 03/06/2016   Vitamin D  deficiency 06/11/2015   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 Experiences some low painand min swelling  PERTINENT HISTORY: See PMH  PAIN:  Are you having pain? Yes: NPRS scale: 1/10 Pain location: L knee Pain description: Ache Aggravating factors: Bending the knee Relieving factors: Pain medication, cold pack  PRECAUTIONS: Chronic back pain  RED FLAGS: None   WEIGHT BEARING RESTRICTIONS: No WBAT  FALLS:  Has patient fallen in last 6 months? 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 Eventually being able to dance.  NEXT MD VISIT: To call and schedule a post surgical appt c Dr Yvonne Hering  OBJECTIVE:  Note: Objective measures were completed at Evaluation unless otherwise noted.  DIAGNOSTIC FINDINGS:  10/30/23 MRI L knee IMPRESSION: 1. Complex tear of the anterior horn of the lateral meniscus and peripheral meniscal extrusion. 14 mm cystic mass anterior to the anterior horn lateral meniscus likely reflecting a parameniscal cyst. 2. High-grade partial-thickness cartilage loss of the medial patellar facet and patellar apex with mild subchondral marrow edema in the patellar apex. 3. Mild partial-thickness  cartilage loss of the weight-bearing lateral femorotibial compartment.  PATIENT SURVEYS:  LEFS 15/80 = 19% or 81% limitation  COGNITION: Overall cognitive status: Within functional limits for tasks assessed     SENSATION: WFL  EDEMA:  Present- covered by post surgical dressing  MUSCLE LENGTH: Hamstrings: Right NT deg; Left NT deg Andy Bannister test: Right NT deg; Left NT deg  POSTURE: rounded shoulders and forward head  PALPATION: TTP peri L knee. Wrapped with ace bandage and dressing in place  LOWER EXTREMITY ROM:  Active ROM Right eval Left eval LT 01/28/24 LT 02/10/24  Hip flexion      Hip extension      Hip abduction      Hip adduction      Hip internal rotation      Hip external rotation      Knee flexion  90 120 130  Knee extension  4 lacking 2 lacking 2 lacking  Ankle dorsiflexion      Ankle plantarflexion      Ankle inversion      Ankle eversion       (Blank rows = not tested)  LOWER EXTREMITY MMT:  MMT Right eval Left eval  Hip flexion 5 4  Hip extension    Hip abduction 5 4  Hip adduction    Hip internal rotation    Hip external rotation 5 4  Knee flexion 5 NT  Knee extension 5 NT  Ankle dorsiflexion    Ankle plantarflexion    Ankle inversion    Ankle eversion     (Blank rows = not tested)  LOWER EXTREMITY SPECIAL TESTS:  NT  FUNCTIONAL TESTS: Assess when pt is able to walk without an assist device 5 times sit to stand: TBA 2 minute walk test: TBA 12.6 sec and 592 feet   GAIT: Distance walked: 200' Assistive device utilized: Crutches Level of assistance: Modified independence Comments: Swing through pattern                                                                                                                   TREATMENT DATE:  [facility] Adult PT Treatment:                  6 wks s/p surgery  DATE: 02/26/24 Therapeutic Activity: Nustep L6 UE/LE x 5 minutes Resisted cable walks fwd 13#, bwd 10#, side steps   STS 2x10 10# Ts c L hand assist 2x10 High Step up 6 inch step (runner's) 2x15 Step down 4 inch x 10, difficult hand assist 2x10 Modalities: Cold pack 10 min L knee   [facility] Adult PT Treatment:                                                DATE: 02/22/24 Therapeutic Exercise: Treadmill, 2.2 mph , 0% grade, 8 min  4 way hip:  SLR, 3 lbs x 10 each flexion, abduction, extension and adduction  Bent knee extension 3 lbs x 10  Plank on forearms 10 sec x 5  High Step up 8 inch step (runner's) x10 Step down 3 inch x 10, difficult Step up and down, lateral 3 inch  Knee extension (OMEGA) 15 lbs x 10 then added 5 lbs x 10  Leg press (OMEGA) 55 lbs x 15, then single leg  Cold pack 10 min L knee   PATIENT EDUCATION:  Education details: Eval findings, POC, HEP, self care  Person educated: Patient Education method: Explanation, Demonstration, Tactile cues, Verbal cues, and Handouts Education comprehension: verbalized understanding, returned demonstration, verbal cues required, and tactile cues required  HOME EXERCISE PROGRAM: Access Code: [email or web address] URL: [email or web address] Date: 02/10/2024 Prepared by: Liborio Reeds  Exercises - Supine Heel Slide  - 2-3 x daily - 7 x weekly - 10 reps - 5 hold - Supine Quad Set  - 2-3 x daily - 7 x weekly - 1 sets - 10 reps - 5 hold - Active Straight Leg Raise with Quad Set  - 2-3 x daily - 7 x weekly - 1 sets - 10 reps - 5 hold - Sidelying Hip Abduction  - 2-3 x daily - 7 x weekly - 1 sets - 10 reps - 3 hold - Hooklying Clamshell with Resistance  - 2-3 x daily - 7 x weekly - 1 sets - 10 reps - 3 hold - Supine Hip Adduction Isometric with Ball  - 2-3 x daily - 7 x weekly - 1 sets - 10 reps - 3 hold - Supine Knee Extension Strengthening  - 2-3 x daily - 7 x weekly - 1 sets - 10 reps - 3 hold - Supine Single Leg Ankle Pumps  - 2 x daily - 7 x weekly - 1 sets - 10 reps - Supine Ankle Circles  - 2 x daily - 7 x weekly - 1 sets - 10 reps - Seated  Table Hamstring Stretch  - 1 x daily - 7 x weekly - 1 sets - 3-5 reps - 30 hold  ASSESSMENT:  CLINICAL IMPRESSION: Pt participated in PT for L knee/LE strengthening with CKC exs. Pt continues to have difficulty controlling c the L knee when stepping down of the R LE with lwo level associated discomfort. Overall, pain, strength and function are improving. Pt tolerated prescribed exercises without adverse effects. Pt will continue to benefit from skilled PT to address impairments for improved L knee/LE function.    Pt participated in PT for L knee/LE strengthening. Continue to progressively increase demand in the CKC. Pt presented with some L knee pain, but tolerated the prescribed today without adverse effects. A cold pack was applied at the EOS for symptom  management. Pt is progressing appropriately s/p menisectomy. Pt will continue to benefit from skilled PT to address impairments for improved L LE function.  OBJECTIVE IMPAIRMENTS: Abnormal gait, decreased activity tolerance, difficulty walking, decreased ROM, decreased strength, increased edema, and pain.   ACTIVITY LIMITATIONS: carrying, lifting, bending, sitting, standing, squatting, sleeping, stairs, transfers, bed mobility, bathing, toileting, dressing, locomotion level, and caring for others  PARTICIPATION LIMITATIONS: meal prep, cleaning, laundry, driving, shopping, community activity, and occupation  PERSONAL FACTORS: Past/current experiences and Time since onset of injury/illness/exacerbation Chronic back pain, are also affecting patient's functional outcome.   REHAB POTENTIAL: Good  CLINICAL DECISION MAKING: Evolving/moderate complexity  EVALUATION COMPLEXITY: Moderate   GOALS:  SHORT TERM GOALS: Target date: 02/12/24 Pt will be Ind in an initial HEP  Baseline: started Goal status: MET  2.  Pt will voice understanding of measures to assist in pain and swelling reduction  Baseline: started Goal status: MET   3.  L knee  AROM will increase to 0-115d for good functional outcome Baseline: 4-90, 0-4-126 flexion  Goal status: ongoing , met for flexion   LONG TERM GOALS: Target date: 03/25/24  Pt will be Ind in a final HEP to maintain achieved LOF  Baseline: started Goal status: ongoing   2.  L LE will demonstrated 5/5 strength for improved function with ADLs Baseline: see flow sheet Goal status: ongoing   3.  L knee will demonstrate 0-125d of AROM for appropriate function with sitting, ascending/descending steps, ambulation Baseline: 4-90d Goal status: ongoing   4.  Improve 5xSTS by MCID of 5" and by MCID of 39ft as indication of improved functional mobility  Baseline: 12.6 sec and 592 feet  Goal status: ongoing   5.  Pt will be able to ascend and descend 18 steps c HR assist for home and community mobility Baseline: assist by crutches , does one step at a time  Goal status: ongoing   6.  Pt will be able to walk with a normalized gait pattern 1000' for community mobility and to progress pt toward return to dance Baseline: Assist by crutches Goal status: MET    PLAN:  PT FREQUENCY: 2x/week  PT DURATION: 8 weeks  PLANNED INTERVENTIONS: 97164- PT Re-evaluation, 97110-Therapeutic exercises, 97530- Therapeutic activity, [id number]- Neuromuscular re-education, 97535- Self Care, 40981- Manual therapy, [id number]- Gait training, [id number]- Electrical stimulation (unattended), 97016- Vasopneumatic device, Patient/Family education, Balance training, Stair training, Taping, Dry Needling, Joint mobilization, Cryotherapy, and Moist heat  PLAN FOR NEXT SESSION: Add in more closed chain.  Quad and hip strength.  Assess response to HEP; progress therex as indicated; use of modalities, manual therapy; and TPDN as indicated.    Jacqeline Broers MS, PT 02/26/24 1:34 PM

## 2024-02-26 ENCOUNTER — Ambulatory Visit: Attending: Orthopaedic Surgery

## 2024-02-26 DIAGNOSIS — M6281 Muscle weakness (generalized): Secondary | ICD-10-CM | POA: Insufficient documentation

## 2024-02-26 DIAGNOSIS — M5459 Other low back pain: Secondary | ICD-10-CM | POA: Insufficient documentation

## 2024-02-29 ENCOUNTER — Ambulatory Visit: Admitting: Physical Therapy

## 2024-03-03 ENCOUNTER — Ambulatory Visit: Admitting: Physical Therapy

## 2024-03-03 DIAGNOSIS — M5459 Other low back pain: Secondary | ICD-10-CM | POA: Diagnosis not present

## 2024-03-03 DIAGNOSIS — M6281 Muscle weakness (generalized): Secondary | ICD-10-CM | POA: Diagnosis not present

## 2024-03-03 NOTE — Therapy (Signed)
 OUTPATIENT PHYSICAL THERAPY LOWER EXTREMITY TREATMENT   Patient Name: Gail Gonzalez MRN: [id number] DOB:1961/09/02, 63 y.o., female Today's Date: 03/03/2024  END OF SESSION:  PT End of Session - 03/03/24 1016     Visit Number 11    Number of Visits 17    Date for PT Re-Evaluation 03/25/24    Authorization Type [employer]    Authorization - Visit Number 11    Authorization - Number of Visits 27    PT Start Time 1015    PT Stop Time 1053    PT Time Calculation (min) 38 min                      Past Medical History:  Diagnosis Date   Anemia    Anxiety    Arthritis    Cardiac arrhythmia due to congenital heart disease    Cataract    Chronic back pain    Colon polyps 04/24/2013   Diverticulosis 02/24/2013   Endometriosis    on lap surgery. seen by Dr. Adelene Homer   Fatty liver    GERD (gastroesophageal reflux disease)    H. 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   Hot flashes, menopausal 10/12/2023   Chronic pain of left knee 06/18/2023   Rhinorrhea 06/18/2023   Pain of right lower extremity 01/06/2023   Fatigue 11/25/2022   Grief counseling 02/02/2022   Anxiety disorder 08/18/2020   Uterine leiomyoma 11/18/2017   Preventative health care 10/11/2016   Tobacco use disorder 10/10/2016   Dyslipidemia 10/10/2016   Hyperglycemia 10/10/2016   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   Seborrheic keratosis 03/06/2016   Back pain 03/06/2016   Vitamin D  deficiency 06/11/2015   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 Today no pain.   PERTINENT HISTORY: See PMH  PAIN:  Are you having pain? Yes: NPRS scale: 0/10 Pain location: L knee Pain description: Ache Aggravating factors: Bending the knee Relieving factors: Pain medication, cold pack  PRECAUTIONS: Chronic back pain  RED FLAGS: None   WEIGHT BEARING RESTRICTIONS: No WBAT  FALLS:  Has patient fallen in last 6 months? 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 Eventually being able to dance.  NEXT MD VISIT: To call and  schedule a post surgical appt c Dr Yvonne Hering  OBJECTIVE:  Note: Objective measures were completed at Evaluation unless otherwise noted.  DIAGNOSTIC FINDINGS:  10/30/23 MRI L knee IMPRESSION: 1. Complex tear of the anterior horn of the lateral meniscus and peripheral meniscal extrusion. 14 mm cystic mass anterior to the anterior horn lateral meniscus likely reflecting a parameniscal cyst. 2. High-grade partial-thickness cartilage loss of the medial patellar facet and patellar apex with mild subchondral marrow edema in the patellar apex. 3. Mild partial-thickness cartilage loss of the weight-bearing lateral femorotibial compartment.  PATIENT SURVEYS:  LEFS  15/80= 19% or 81% limitation  COGNITION: Overall cognitive status: Within functional limits for tasks assessed     SENSATION: WFL  EDEMA:  Present- covered by post surgical dressing  MUSCLE LENGTH: Hamstrings: Right NT deg; Left NT deg Andy Bannister test: Right NT deg; Left NT deg  POSTURE: rounded shoulders and forward head  PALPATION: TTP peri L knee. Wrapped with ace bandage and dressing in place  LOWER EXTREMITY ROM:  Active ROM Right eval Left eval LT 01/28/24 LT 02/10/24 LT 03/03/24  Hip flexion       Hip extension       Hip abduction       Hip adduction       Hip internal rotation       Hip external rotation       Knee flexion  90 120 130   Knee extension  4 lacking 2 lacking 2 lacking 2 lacking  Ankle dorsiflexion       Ankle plantarflexion       Ankle inversion       Ankle eversion        (Blank rows = not tested)  LOWER EXTREMITY MMT:  MMT Right eval Left eval  Hip flexion 5 4  Hip extension    Hip abduction 5 4  Hip adduction    Hip internal rotation    Hip external rotation 5 4  Knee flexion 5 NT  Knee extension 5 NT  Ankle dorsiflexion    Ankle plantarflexion    Ankle inversion    Ankle eversion     (Blank rows = not tested)  LOWER EXTREMITY SPECIAL TESTS:  NT  FUNCTIONAL TESTS: Assess when pt is able to walk without an assist device 5 times sit to stand: TBA 2 minute walk test: TBA 12.6 sec and 592 feet   GAIT: Distance walked: 200' Assistive device utilized: Crutches Level of assistance: Modified independence Comments: Swing through pattern                                                                                                                   TREATMENT DATE:  [facility] Adult PT Treatment:           7 wks s/p surgery  DATE: 03/03/24 Therapeutic Exercise: Leg press 30# single left leg 10 x 2  Seated LAQ Red/ green band 10 x 2  Updated HEP  Therapeutic Activity: 8 inch step up 4inch lateral , 6  inch lateral  4 inch step off forward - pain  2 inch step off forward - less pain  STS with LLE back 10 x 2      [facility] Adult PT Treatment:                  6 wks s/p surgery                              DATE: 02/26/24 Therapeutic Activity: Nustep L6 UE/LE x 5 minutes Resisted cable walks fwd 13#, bwd 10#, side steps  STS 2x10 10# Ts c L hand assist 2x10 High Step up 6 inch step (runner's) 2x15 Step down 4 inch x 10, difficult hand assist 2x10 Modalities: Cold pack 10 min L knee   [facility] Adult PT Treatment:                                                DATE: 02/22/24 Therapeutic Exercise: Treadmill, 2.2 mph , 0% grade, 8 min  4 way hip:  SLR, 3 lbs x 10 each flexion, abduction, extension and adduction  Bent knee extension 3 lbs x 10  Plank on forearms 10 sec x 5  High Step up 8 inch step (runner's) x10 Step down 3 inch x 10, difficult Step up and down, lateral 3 inch  Knee extension (OMEGA) 15 lbs x 10 then added 5 lbs x 10  Leg press (OMEGA) 55 lbs x 15 Cold pack 10 min L knee   PATIENT EDUCATION:  Education details: Eval findings, POC, HEP, self care  Person educated: Patient Education method: Explanation, Demonstration, Tactile cues, Verbal cues, and Handouts Education comprehension: verbalized understanding, returned demonstration, verbal cues required, and tactile cues required  HOME EXERCISE PROGRAM: Access Code: [email or web address] URL: [email or web address] Date: 02/10/2024 Prepared by: Liborio Reeds  Exercises - Supine Heel Slide  - 2-3 x daily - 7 x weekly - 10 reps - 5 hold - Supine Quad Set  - 2-3 x daily - 7 x weekly - 1 sets - 10 reps - 5 hold - Active Straight Leg Raise with Quad Set  - 2-3 x daily - 7 x weekly - 1 sets - 10 reps - 5 hold - Sidelying Hip Abduction  - 2-3 x daily - 7 x weekly - 1 sets - 10 reps - 3 hold - Hooklying Clamshell with Resistance  - 2-3 x daily - 7 x weekly - 1 sets - 10 reps - 3 hold - Supine Hip Adduction Isometric with Ball  -  2-3 x daily - 7 x weekly - 1 sets - 10 reps - 3 hold - Supine Knee Extension Strengthening  - 2-3 x daily - 7 x weekly - 1 sets - 10 reps - 3 hold - Supine Single Leg Ankle Pumps  - 2 x daily - 7 x weekly - 1 sets - 10 reps - Supine Ankle Circles  - 2 x daily - 7 x weekly - 1 sets - 10 reps - Seated Table Hamstring Stretch  - 1 x daily - 7 x weekly - 1 sets -  3-5 reps - 30 hold  ASSESSMENT:  CLINICAL IMPRESSION: Pt participated in PT for L knee/LE strengthening with CKC exs. Pt continues to have pain and difficulty controlling  the L knee when descending stairs. Worked on comfortable quad strengthening and eccentric focus. Updated HEP for additional open and closed chain quad strength.  Overall, pain, strength and function are improving with occassional pain with STS after sitting prolonged sitting.  Pt tolerated prescribed exercises without adverse effects. Pt will continue to benefit from skilled PT to address impairments for improved L knee/LE function.    EVAL: Patient is a 63 y.o. female who was seen today for physical therapy evaluation and treatment for Left knee arthroscopy with lateral meniscectomy 01/18/24 .    OBJECTIVE IMPAIRMENTS: Abnormal gait, decreased activity tolerance, difficulty walking, decreased ROM, decreased strength, increased edema, and pain.   ACTIVITY LIMITATIONS: carrying, lifting, bending, sitting, standing, squatting, sleeping, stairs, transfers, bed mobility, bathing, toileting, dressing, locomotion level, and caring for others  PARTICIPATION LIMITATIONS: meal prep, cleaning, laundry, driving, shopping, community activity, and occupation  PERSONAL FACTORS: Past/current experiences and Time since onset of injury/illness/exacerbation Chronic back pain, are also affecting patient's functional outcome.   REHAB POTENTIAL: Good  CLINICAL DECISION MAKING: Evolving/moderate complexity  EVALUATION COMPLEXITY: Moderate   GOALS:  SHORT TERM GOALS: Target date:  02/12/24 Pt will be Ind in an initial HEP  Baseline: started Goal status: MET  2.  Pt will voice understanding of measures to assist in pain and swelling reduction  Baseline: started Goal status: MET   3.  L knee AROM will increase to 0-115d for good functional outcome Baseline: 4-90, 0-4-126 flexion  Goal status: ongoing , met for flexion   LONG TERM GOALS: Target date: 03/25/24  Pt will be Ind in a final HEP to maintain achieved LOF  Baseline: started Goal status: ongoing   2.  L LE will demonstrated 5/5 strength for improved function with ADLs Baseline: see flow sheet Goal status: ongoing   3.  L knee will demonstrate 0-125d of AROM for appropriate function with sitting, ascending/descending steps, ambulation Baseline: 4-90d 5/8/25L lacks a few degrees of extension Goal status: ongoing   4.  Improve 5xSTS by MCID of 5" and by MCID of 38ft as indication of improved functional mobility  Baseline: 12.6 sec and 592 feet  Goal status: ongoing   5.  Pt will be able to ascend and descend 18 steps c HR assist for home and community mobility Baseline: assist by crutches , does one step at a time  03/03/24- pain on decent  Goal status: ongoing   6.  Pt will be able to walk with a normalized gait pattern 1000' for community mobility and to progress pt toward return to dance Baseline: Assist by crutches Goal status: MET    PLAN:  PT FREQUENCY: 2x/week  PT DURATION: 8 weeks  PLANNED INTERVENTIONS: 97164- PT Re-evaluation, 97110-Therapeutic exercises, 97530- Therapeutic activity, [id number]- Neuromuscular re-education, 97535- Self Care, 16109- Manual therapy, [id number]- Gait training, [phone]- Electrical stimulation (unattended), 97016- Vasopneumatic device, Patient/Family education, Balance training, Stair training, Taping, Dry Needling, Joint mobilization, Cryotherapy, and Moist heat  PLAN FOR NEXT SESSION: Add in more closed chain.  Quad and hip strength.  Assess response to HEP;  progress therex as indicated; use of modalities, manual therapy; and TPDN as indicated.    Gasper Karst, PTA 03/03/24 12:10 PM Phone: [phone] Fax: [phone]

## 2024-03-07 ENCOUNTER — Encounter: Payer: Self-pay | Admitting: Physical Therapy

## 2024-03-07 ENCOUNTER — Ambulatory Visit: Admitting: Physical Therapy

## 2024-03-07 DIAGNOSIS — M5459 Other low back pain: Secondary | ICD-10-CM | POA: Diagnosis not present

## 2024-03-07 DIAGNOSIS — M6281 Muscle weakness (generalized): Secondary | ICD-10-CM

## 2024-03-07 NOTE — Therapy (Signed)
 OUTPATIENT PHYSICAL THERAPY LOWER EXTREMITY TREATMENT   Patient Name: Gail Gonzalez MRN: [id number] DOB:[birth year], 63 y.o., female Today's Date: 03/07/2024  END OF SESSION:  PT End of Session - 03/07/24 1335     Visit Number 12    Number of Visits 17    Date for PT Re-Evaluation 03/25/24    Authorization Type [employer]    Authorization - Visit Number 12    Authorization - Number of Visits 27    PT Start Time 1330    PT Stop Time 1418    PT Time Calculation (min) 48 min    Activity Tolerance Patient tolerated treatment well    Behavior During Therapy [facility] for tasks assessed/performed               Past Medical History:  Diagnosis Date   Anemia    Anxiety    Arthritis    Cardiac arrhythmia due to congenital heart disease    Cataract    Chronic back pain    Colon polyps 04/24/2013   Diverticulosis 02/24/2013   Endometriosis    on lap surgery. seen by Dr. Adelene Homer   Fatty liver    GERD (gastroesophageal reflux disease)    H. 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   Hot flashes, menopausal 10/12/2023   Chronic pain of left knee 06/18/2023   Rhinorrhea 06/18/2023   Pain of right lower extremity 01/06/2023   Fatigue 11/25/2022   Grief counseling 02/02/2022   Anxiety disorder 08/18/2020   Uterine leiomyoma 11/18/2017   Preventative health care 10/11/2016   Tobacco use disorder 10/10/2016   Dyslipidemia 10/10/2016   Hyperglycemia 10/10/2016   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   Seborrheic keratosis 03/06/2016   Back pain 03/06/2016   Vitamin D  deficiency 06/11/2015   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  It still feels a bit weak with stairs.   PERTINENT HISTORY: See PMH  PAIN:  Are you having pain? Yes: NPRS scale: 0/10 Pain location: L knee Pain description: Ache Aggravating factors: Bending the knee Relieving factors: Pain medication, cold pack  PRECAUTIONS: Chronic back pain  RED FLAGS: None   WEIGHT BEARING RESTRICTIONS: No WBAT  FALLS:  Has patient fallen in last 6 months? 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 Eventually being able to  dance.  NEXT MD VISIT: To call and schedule a post surgical appt c Dr Yvonne Hering  OBJECTIVE:  Note: Objective measures were completed at Evaluation unless otherwise noted.  DIAGNOSTIC FINDINGS:  10/30/23 MRI L knee IMPRESSION: 1. Complex tear of the anterior horn of the lateral meniscus and peripheral meniscal extrusion. 14 mm cystic mass anterior to the anterior horn lateral meniscus likely reflecting a parameniscal cyst. 2. High-grade partial-thickness cartilage loss of the medial patellar facet and patellar apex with mild subchondral marrow edema in the patellar apex. 3. Mild partial-thickness cartilage loss of the  weight-bearing lateral femorotibial compartment.  PATIENT SURVEYS:  LEFS 15/80= 19% or 81% limitation  COGNITION: Overall cognitive status: Within functional limits for tasks assessed     SENSATION: WFL  EDEMA:  Present- covered by post surgical dressing  MUSCLE LENGTH: Hamstrings: Right NT deg; Left NT deg Andy Bannister test: Right NT deg; Left NT deg  POSTURE: rounded shoulders and forward head  PALPATION: TTP peri L knee. Wrapped with ace bandage and dressing in place  LOWER EXTREMITY ROM:  Active ROM Right eval Left eval LT 01/28/24 LT 02/10/24 LT 03/03/24  Hip flexion       Hip extension       Hip abduction       Hip adduction       Hip internal rotation       Hip external rotation       Knee flexion  90 120 130   Knee extension  4 lacking 2 lacking 2 lacking 2 lacking  Ankle dorsiflexion       Ankle plantarflexion       Ankle inversion       Ankle eversion        (Blank rows = not tested)  LOWER EXTREMITY MMT:  MMT Right eval Left eval Lt.  03/07/24  Hip flexion 5 4   Hip extension     Hip abduction 5 4   Hip adduction     Hip internal rotation     Hip external rotation 5 4   Knee flexion 5 NT 5/5  Knee extension 5 NT 4+/5, min pain   Ankle dorsiflexion     Ankle plantarflexion     Ankle inversion     Ankle eversion      (Blank rows = not tested)  LOWER EXTREMITY SPECIAL TESTS:  NT  FUNCTIONAL TESTS: Assess when pt is able to walk without an assist device 5 times sit to stand: TBA 2 minute walk test: TBA 12.6 sec and 592 feet   GAIT: Distance walked: 200' Assistive device utilized: Crutches Level of assistance: Modified independence Comments: Swing through pattern                                                                                                                   TREATMENT DATE:    [facility] Adult PT Treatment:  DATE: 03/07/24 Therapeutic Exercise: TM up to 2.5 mph, 4% grade LAQ 5 lbs   Wall sit cues needed  Wall sit 30 sec Lt LE focus  Step up 8 inch (min to no pain) No UEs  SLS with 10 lbs KB 30 sec  SLS with 5 lbs KB pass, cues  Tandem stance "around the world" 30 sec each direction, each LE  Step downs 1 UE 4 inch  Leg press 1 -2 plate double leg x 15, then single leg x 10 each  SLR propped on forearms Hamstring and ITB stretching    [facility] Adult PT Treatment:           7 wks s/p surgery                                     DATE: 03/03/24 Therapeutic Exercise: Leg press 30# single left leg 10 x 2  Seated LAQ Red/ green band 10 x 2  Updated HEP  Therapeutic Activity: 8 inch step up 4inch lateral , 6 inch lateral  4 inch step off forward - pain  2 inch step off forward - less pain  STS with LLE back 10 x 2      [facility] Adult PT Treatment:                  6 wks s/p surgery                              DATE: 02/26/24 Therapeutic Activity: Nustep L6 UE/LE x 5 minutes Resisted cable walks fwd 13#, bwd 10#, side steps  STS 2x10 10# Ts c L hand assist 2x10 High Step up 6 inch step (runner's) 2x15 Step down 4 inch x 10, difficult hand assist 2x10 Modalities: Cold pack 10 min L knee   [facility] Adult PT Treatment:                                                DATE: 02/22/24 Therapeutic Exercise: Treadmill, 2.2 mph , 0% grade, 8 min  4 way hip:  SLR, 3 lbs x 10 each flexion, abduction, extension and adduction  Bent knee extension 3 lbs x 10  Plank on forearms 10 sec x 5  High Step up 8 inch step (runner's) x10 Step down 3 inch x 10, difficult Step up and down, lateral 3 inch  Knee extension (OMEGA) 15 lbs x 10 then added 5 lbs x 10  Leg press (OMEGA) 55 lbs x 15 Cold pack 10 min L knee   PATIENT EDUCATION:  Education details: Eval findings, POC, HEP, self care  Person educated: Patient Education method: Explanation, Demonstration, Tactile cues, Verbal cues, and Handouts Education comprehension: verbalized understanding, returned demonstration, verbal cues  required, and tactile cues required  HOME EXERCISE PROGRAM: Access Code: [email or web address] URL: [email or web address] Date: 02/10/2024 Prepared by: Liborio Reeds  Exercises - Supine Heel Slide  - 2-3 x daily - 7 x weekly - 10 reps - 5 hold - Supine Quad Set  - 2-3 x daily - 7 x weekly - 1 sets - 10 reps - 5 hold - Active Straight Leg Raise with Quad Set  - 2-3 x daily - 7 x weekly - 1 sets -  10 reps - 5 hold - Sidelying Hip Abduction  - 2-3 x daily - 7 x weekly - 1 sets - 10 reps - 3 hold - Hooklying Clamshell with Resistance  - 2-3 x daily - 7 x weekly - 1 sets - 10 reps - 3 hold - Supine Hip Adduction Isometric with Ball  - 2-3 x daily - 7 x weekly - 1 sets - 10 reps - 3 hold - Supine Knee Extension Strengthening  - 2-3 x daily - 7 x weekly - 1 sets - 10 reps - 3 hold - Supine Single Leg Ankle Pumps  - 2 x daily - 7 x weekly - 1 sets - 10 reps - Supine Ankle Circles  - 2 x daily - 7 x weekly - 1 sets - 10 reps - Seated Table Hamstring Stretch  - 1 x daily - 7 x weekly - 1 sets - 3-5 reps - 30 hold  ASSESSMENT:  CLINICAL IMPRESSION:  Patient doing well overall, quads lack endurance and control with eccentric exercises (step downs) .  She was asked to focus on this as she finishes the last few visits in PT. Overall, pain, strength and function are improving with occassional pain with STS after sitting prolonged sitting.  Pt tolerated prescribed exercises without adverse effects. Pt will continue to benefit from skilled PT to address impairments for improved L knee/LE function.     EVAL: Patient is a 63 y.o. female who was seen today for physical therapy evaluation and treatment for Left knee arthroscopy with lateral meniscectomy 01/18/24 .    OBJECTIVE IMPAIRMENTS: Abnormal gait, decreased activity tolerance, difficulty walking, decreased ROM, decreased strength, increased edema, and pain.   ACTIVITY LIMITATIONS: carrying, lifting, bending, sitting, standing, squatting, sleeping,  stairs, transfers, bed mobility, bathing, toileting, dressing, locomotion level, and caring for others  PARTICIPATION LIMITATIONS: meal prep, cleaning, laundry, driving, shopping, community activity, and occupation  PERSONAL FACTORS: Past/current experiences and Time since onset of injury/illness/exacerbation Chronic back pain, are also affecting patient's functional outcome.   REHAB POTENTIAL: Good  CLINICAL DECISION MAKING: Evolving/moderate complexity  EVALUATION COMPLEXITY: Moderate   GOALS:  SHORT TERM GOALS: Target date: 02/12/24 Pt will be Ind in an initial HEP  Baseline: started Goal status: MET  2.  Pt will voice understanding of measures to assist in pain and swelling reduction  Baseline: started Goal status: MET   3.  L knee AROM will increase to 0-115d for good functional outcome Baseline: 4-90, 0-4-126 flexion  Goal status: ongoing , met for flexion   LONG TERM GOALS: Target date: 03/25/24  Pt will be Ind in a final HEP to maintain achieved LOF  Baseline: started Goal status: ongoing   2.  L LE will demonstrated 5/5 strength for improved function with ADLs Baseline: see flow sheet Goal status: ongoing   3.  L knee will demonstrate 0-125d of AROM for appropriate function with sitting, ascending/descending steps, ambulation Baseline: 4-90d 5/8/25L lacks a few degrees of extension Goal status: ongoing   4.  Improve 5xSTS by MCID of 5" and by MCID of 59ft as indication of improved functional mobility  Baseline: 12.6 sec and 592 feet  Goal status: ongoing   5.  Pt will be able to ascend and descend 18 steps c HR assist for home and community mobility Baseline: assist by crutches , does one step at a time  03/03/24- pain on decent  Goal status: ongoing   6.  Pt will be able to walk  with a normalized gait pattern 1000' for community mobility and to progress pt toward return to dance Baseline: Assist by crutches Goal status: MET    PLAN:  PT FREQUENCY:  2x/week  PT DURATION: 8 weeks  PLANNED INTERVENTIONS: 97164- PT Re-evaluation, 97110-Therapeutic exercises, 97530- Therapeutic activity, [id number]- Neuromuscular re-education, 97535- Self Care, [id number]- Manual therapy, [id number]- Gait training, [phone]- Electrical stimulation (unattended), 97016- Vasopneumatic device, Patient/Family education, Balance training, Stair training, Taping, Dry Needling, Joint mobilization, Cryotherapy, and Moist heat  PLAN FOR NEXT SESSION: Re-check STS, 2 min walk.  Quads.  Assess response to HEP; progress therex as indicated; use of modalities, manual therapy; and TPDN as indicated.    Marci Setter, PT 03/07/24 2:27 PM Phone: [phone] Fax: [phone]

## 2024-03-09 NOTE — Therapy (Deleted)
 OUTPATIENT PHYSICAL THERAPY LOWER EXTREMITY TREATMENT   Patient Name: Gail Gonzalez MRN: [id number] DOB:1961/01/29, 63 y.o., female Today's Date: 03/09/2024  END OF SESSION:      Past Medical History:  Diagnosis Date   Anemia    Anxiety    Arthritis    Cardiac arrhythmia due to congenital heart disease    Cataract    Chronic back pain    Colon polyps 04/24/2013   Diverticulosis 02/24/2013   Endometriosis    on lap surgery. seen by Dr. Adelene Homer   Fatty liver    GERD (gastroesophageal reflux disease)    H. 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   Hot flashes, menopausal 10/12/2023   Chronic pain of left knee 06/18/2023   Rhinorrhea 06/18/2023   Pain of right lower extremity 01/06/2023   Fatigue 11/25/2022   Grief counseling 02/02/2022   Anxiety disorder 08/18/2020   Uterine leiomyoma 11/18/2017   Preventative health care 10/11/2016   Tobacco use disorder 10/10/2016   Dyslipidemia 10/10/2016   Hyperglycemia 10/10/2016   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  Rationale for Evaluation and Treatment: Rehabilitation  ONSET  DATE: 01/18/24  SUBJECTIVE:   SUBJECTIVE STATEMENT: No pain .  It still feels a bit weak with stairs.   PERTINENT HISTORY: See PMH  PAIN:  Are you having pain? Yes: NPRS scale: 0/10 Pain location: L knee Pain description: Ache Aggravating factors: Bending the knee Relieving factors: Pain medication, cold pack  PRECAUTIONS: Chronic back pain  RED FLAGS: None   WEIGHT BEARING RESTRICTIONS: No WBAT  FALLS:  Has patient fallen in last 6 months? 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 Eventually being able to dance.  NEXT MD VISIT: To call and schedule a post surgical appt c Dr Yvonne Hering  OBJECTIVE:  Note: Objective measures were completed at Evaluation unless otherwise noted.  DIAGNOSTIC FINDINGS:  10/30/23 MRI L knee IMPRESSION: 1. Complex tear of the anterior horn of the lateral meniscus and peripheral meniscal extrusion. 14 mm cystic mass anterior to the anterior horn lateral meniscus likely reflecting a parameniscal cyst. 2. High-grade partial-thickness cartilage loss of the medial patellar facet and patellar apex with mild subchondral marrow edema in the patellar apex. 3. Mild partial-thickness cartilage loss of the weight-bearing lateral femorotibial compartment.  PATIENT SURVEYS:  LEFS 15/80=  19% or 81% limitation  COGNITION: Overall cognitive status: Within functional limits for tasks assessed     SENSATION: WFL  EDEMA:  Present- covered by post surgical dressing  MUSCLE LENGTH: Hamstrings: Right NT deg; Left NT deg Andy Bannister test: Right NT deg; Left NT deg  POSTURE: rounded shoulders and forward head  PALPATION: TTP peri L knee. Wrapped with ace bandage and dressing  in place  LOWER EXTREMITY ROM:  Active ROM Right eval Left eval LT 01/28/24 LT 02/10/24 LT 03/03/24  Hip flexion       Hip extension       Hip abduction       Hip adduction       Hip internal rotation       Hip external rotation       Knee flexion  90 120 130   Knee extension  4 lacking 2 lacking 2 lacking 2 lacking  Ankle dorsiflexion       Ankle plantarflexion       Ankle inversion       Ankle eversion        (Blank rows = not tested)  LOWER EXTREMITY MMT:  MMT Right eval Left eval Lt.  03/07/24  Hip flexion 5 4   Hip extension     Hip abduction 5 4   Hip adduction     Hip internal rotation     Hip external rotation 5 4   Knee flexion 5 NT 5/5  Knee extension 5 NT 4+/5, min pain   Ankle dorsiflexion     Ankle plantarflexion     Ankle inversion     Ankle eversion      (Blank rows = not tested)  LOWER EXTREMITY SPECIAL TESTS:  NT  FUNCTIONAL TESTS: Assess when pt is able to walk without an assist device 5 times sit to stand: TBA 2 minute walk test: TBA 12.6 sec and 592 feet   GAIT: Distance walked: 200' Assistive device utilized: Crutches Level of assistance: Modified independence Comments: Swing through pattern                                                                                                                   TREATMENT DATE:     [facility] Adult PT Treatment:                         8 weeks                        DATE: 03/10/24 Therapeutic Exercise: *** Manual Therapy: *** Neuromuscular re-ed: *** Therapeutic Activity: *** Modalities: *** Self Care: Renaldo Caroli Adult PT Treatment:                                                DATE: 03/07/24 Therapeutic Exercise: TM up to  2.5 mph, 4% grade LAQ 5 lbs  Wall sit cues needed  Wall sit 30 sec Lt LE focus  Step up 8 inch (min to no pain) No UEs  SLS with 10 lbs KB 30 sec  SLS with 5 lbs KB pass, cues  Tandem stance "around the world" 30 sec each direction, each LE  Step downs 1 UE 4 inch   Leg press 1 -2 plate double leg x 15, then single leg x 10 each  SLR propped on forearms Hamstring and ITB stretching    [facility] Adult PT Treatment:           7 wks s/p surgery                                     DATE: 03/03/24 Therapeutic Exercise: Leg press 30# single left leg 10 x 2  Seated LAQ Red/ green band 10 x 2  Updated HEP  Therapeutic Activity: 8 inch step up 4inch lateral , 6 inch lateral  4 inch step off forward - pain  2 inch step off forward - less pain  STS with LLE back 10 x 2      [facility] Adult PT Treatment:                  6 wks s/p surgery                              DATE: 02/26/24 Therapeutic Activity: Nustep L6 UE/LE x 5 minutes Resisted cable walks fwd 13#, bwd 10#, side steps  STS 2x10 10# Ts c L hand assist 2x10 High Step up 6 inch step (runner's) 2x15 Step down 4 inch x 10, difficult hand assist 2x10 Modalities: Cold pack 10 min L knee   [facility] Adult PT Treatment:                                                DATE: 02/22/24 Therapeutic Exercise: Treadmill, 2.2 mph , 0% grade, 8 min  4 way hip:  SLR, 3 lbs x 10 each flexion, abduction, extension and adduction  Bent knee extension 3 lbs x 10  Plank on forearms 10 sec x 5  High Step up 8 inch step (runner's) x10 Step down 3 inch x 10, difficult Step up and down, lateral 3 inch  Knee extension (OMEGA) 15 lbs x 10 then added 5 lbs x 10  Leg press (OMEGA) 55 lbs x 15 Cold pack 10 min L knee   PATIENT EDUCATION:  Education details: Eval findings, POC, HEP, self care  Person educated: Patient Education method: Explanation, Demonstration, Tactile cues, Verbal cues, and Handouts Education comprehension: verbalized understanding, returned demonstration, verbal cues required, and tactile cues required  HOME EXERCISE PROGRAM: Access Code: [email or web address] URL: [email or web address] Date: 02/10/2024 Prepared by: Liborio Reeds  Exercises - Supine Heel Slide  - 2-3 x daily - 7 x weekly - 10 reps - 5  hold - Supine Quad Set  - 2-3 x daily - 7 x weekly - 1 sets - 10 reps - 5 hold - Active Straight Leg Raise with Quad Set  - 2-3 x daily - 7 x weekly - 1 sets - 10 reps - 5 hold - Sidelying  Hip Abduction  - 2-3 x daily - 7 x weekly - 1 sets - 10 reps - 3 hold - Hooklying Clamshell with Resistance  - 2-3 x daily - 7 x weekly - 1 sets - 10 reps - 3 hold - Supine Hip Adduction Isometric with Ball  - 2-3 x daily - 7 x weekly - 1 sets - 10 reps - 3 hold - Supine Knee Extension Strengthening  - 2-3 x daily - 7 x weekly - 1 sets - 10 reps - 3 hold - Supine Single Leg Ankle Pumps  - 2 x daily - 7 x weekly - 1 sets - 10 reps - Supine Ankle Circles  - 2 x daily - 7 x weekly - 1 sets - 10 reps - Seated Table Hamstring Stretch  - 1 x daily - 7 x weekly - 1 sets - 3-5 reps - 30 hold  ASSESSMENT:  CLINICAL IMPRESSION:  Patient doing well overall, quads lack endurance and control with eccentric exercises (step downs) .  She was asked to focus on this as she finishes the last few visits in PT. Overall, pain, strength and function are improving with occassional pain with STS after sitting prolonged sitting.  Pt tolerated prescribed exercises without adverse effects. Pt will continue to benefit from skilled PT to address impairments for improved L knee/LE function.     EVAL: Patient is a 63 y.o. female who was seen today for physical therapy evaluation and treatment for Left knee arthroscopy with lateral meniscectomy 01/18/24 .    OBJECTIVE IMPAIRMENTS: Abnormal gait, decreased activity tolerance, difficulty walking, decreased ROM, decreased strength, increased edema, and pain.   ACTIVITY LIMITATIONS: carrying, lifting, bending, sitting, standing, squatting, sleeping, stairs, transfers, bed mobility, bathing, toileting, dressing, locomotion level, and caring for others  PARTICIPATION LIMITATIONS: meal prep, cleaning, laundry, driving, shopping, community activity, and occupation  PERSONAL FACTORS:  Past/current experiences and Time since onset of injury/illness/exacerbation Chronic back pain, are also affecting patient's functional outcome.   REHAB POTENTIAL: Good  CLINICAL DECISION MAKING: Evolving/moderate complexity  EVALUATION COMPLEXITY: Moderate   GOALS:  SHORT TERM GOALS: Target date: 02/12/24 Pt will be Ind in an initial HEP  Baseline: started Goal status: MET  2.  Pt will voice understanding of measures to assist in pain and swelling reduction  Baseline: started Goal status: MET   3.  L knee AROM will increase to 0-115d for good functional outcome Baseline: 4-90, 0-4-126 flexion  Goal status: ongoing , met for flexion   LONG TERM GOALS: Target date: 03/25/24  Pt will be Ind in a final HEP to maintain achieved LOF  Baseline: started Goal status: ongoing   2.  L LE will demonstrated 5/5 strength for improved function with ADLs Baseline: see flow sheet Goal status: ongoing   3.  L knee will demonstrate 0-125d of AROM for appropriate function with sitting, ascending/descending steps, ambulation Baseline: 4-90d 5/8/25L lacks a few degrees of extension Goal status: ongoing   4.  Improve 5xSTS by MCID of 5" and by MCID of 42ft as indication of improved functional mobility  Baseline: 12.6 sec and 592 feet  Goal status: ongoing   5.  Pt will be able to ascend and descend 18 steps c HR assist for home and community mobility Baseline: assist by crutches , does one step at a time  03/03/24- pain on decent  Goal status: ongoing   6.  Pt will be able to walk with a normalized gait pattern 1000' for  community mobility and to progress pt toward return to dance Baseline: Assist by crutches Goal status: MET    PLAN:  PT FREQUENCY: 2x/week  PT DURATION: 8 weeks  PLANNED INTERVENTIONS: 97164- PT Re-evaluation, 97110-Therapeutic exercises, 97530- Therapeutic activity, [id number]- Neuromuscular re-education, 97535- Self Care, 16109- Manual therapy, [id number]- Gait  training, [phone]- Electrical stimulation (unattended), 97016- Vasopneumatic device, Patient/Family education, Balance training, Stair training, Taping, Dry Needling, Joint mobilization, Cryotherapy, and Moist heat  PLAN FOR NEXT SESSION: Re-check STS, 2 min walk.  Quads.  Assess response to HEP; progress therex as indicated; use of modalities, manual therapy; and TPDN as indicated.    Marci Setter, PT 03/09/24 2:42 PM Phone: [phone] Fax: [phone]

## 2024-03-10 ENCOUNTER — Ambulatory Visit: Admitting: Physical Therapy

## 2024-03-11 ENCOUNTER — Ambulatory Visit: Admitting: Physical Therapy

## 2024-03-12 ENCOUNTER — Telehealth: Admitting: Nurse Practitioner

## 2024-03-12 DIAGNOSIS — M545 Low back pain, unspecified: Secondary | ICD-10-CM

## 2024-03-12 MED ORDER — METHOCARBAMOL 500 MG PO TABS
500.0000 mg | ORAL_TABLET | Freq: Two times a day (BID) | ORAL | 0 refills | Status: AC
Start: 2024-03-12 — End: ?

## 2024-03-12 MED ORDER — PREDNISONE 20 MG PO TABS: 40.0000 mg | ORAL_TABLET | Freq: Every day | ORAL | 0 refills | Status: AC

## 2024-03-12 NOTE — Patient Instructions (Signed)
 Gail Gonzalez, thank you for joining Collins Dean, NP for today's virtual visit.  While this provider is not your primary care provider (PCP), if your PCP is located in our provider database this encounter information will be shared with them immediately following your visit.   A [facility] MyChart account gives you access to today's visit and all your visits, tests, and labs performed at [facility] " click here if you don't have a [facility] MyChart account or go to [email or web address]  Consent: (Patient) Gail Gonzalez provided verbal consent for this virtual visit at the beginning of the encounter.  Current Medications:  Current Outpatient Medications:    predniSONE  (DELTASONE ) 20 MG tablet, Take 2 tablets (40 mg total) by mouth daily with breakfast for 5 days., Disp: 10 tablet, Rfl: 0   atorvastatin  (LIPITOR) 10 MG tablet, TAKE 1 TABLET BY MOUTH EVERY DAY *NEED UPDATED INS*, Disp: 90 tablet, Rfl: 1   cetirizine  (ZYRTEC ) 10 MG tablet, Take 1 tablet (10 mg total) by mouth daily as needed for allergies., Disp: 30 tablet, Rfl: 11   cyclobenzaprine  (FLEXERIL ) 10 MG tablet, Take 1 tablet (10 mg total) by mouth 2 (two) times daily as needed for muscle spasms., Disp: 20 tablet, Rfl: 0   escitalopram  (LEXAPRO ) 20 MG tablet, TAKE 1 TABLET BY MOUTH EVERY DAY, Disp: 90 tablet, Rfl: 1   famotidine  (PEPCID ) 20 MG tablet, TAKE 1 TABLET(20 MG) BY MOUTH TWICE DAILY, Disp: 180 tablet, Rfl: 1   fluticasone  (FLONASE ) 50 MCG/ACT nasal spray, Place 2 sprays into both nostrils daily., Disp: 16 g, Rfl: 6   hydrOXYzine  (ATARAX ) 10 MG tablet, TAKE 1/2 TABLET(5 MG) BY MOUTH THREE TIMES DAILY AS NEEDED, Disp: 30 tablet, Rfl: 0   meloxicam  (MOBIC ) 15 MG tablet, Take 1 tablet (15 mg total) by mouth daily., Disp: 30 tablet, Rfl: 2   methocarbamol  (ROBAXIN ) 500 MG tablet, Take 1 tablet (500 mg total) by mouth 2 (two) times daily., Disp: 30 tablet, Rfl: 0   Multiple Vitamin (MULTIVITAMIN) capsule, Take  1 capsule by mouth daily., Disp: , Rfl:    Omega-3 1000 MG CAPS, Take by mouth., Disp: , Rfl:    Probiotic Product (PROBIOTIC DAILY PO), Take by mouth., Disp: , Rfl:    valACYclovir  (VALTREX ) 1000 MG tablet, Take 1 tablet (1,000 mg total) by mouth 2 (two) times daily. For 1 week, if cold sore begins to appear, Disp: 28 tablet, Rfl: 1   Medications ordered in this encounter:  Meds ordered this encounter  Medications   predniSONE  (DELTASONE ) 20 MG tablet    Sig: Take 2 tablets (40 mg total) by mouth daily with breakfast for 5 days.    Dispense:  10 tablet    Refill:  0    Supervising Provider:   LAMPTEY, PHILIP O [[id number]]   methocarbamol  (ROBAXIN ) 500 MG tablet    Sig: Take 1 tablet (500 mg total) by mouth 2 (two) times daily.    Dispense:  30 tablet    Refill:  0    Supervising Provider:   LAMPTEY, PHILIP O [[id number]]     *If you need refills on other medications prior to your next appointment, please contact your pharmacy*  Follow-Up: Call back or seek an in-person evaluation if the symptoms worsen or if the condition fails to improve as anticipated.  [facility] [phone]  Other Instructions May alternate with heat and ice application for pain relief. May also alternate with acetaminophen  and Ibuprofen  as prescribed for  back pain. Other alternatives include massage, acupuncture and water aerobics.    If you have been instructed to have an in-person evaluation today at a local Urgent Care facility, please use the link below. It will take you to a list of all of our available [facility], including address, phone number and hours of operation. Please do not delay care.  [facility]  If you or a family member do not have a primary care provider, use the link below to schedule a visit and establish care. When you choose a [facility] primary care physician or advanced practice provider, you gain a long-term partner in health. Find a Primary  Care Provider  Learn more about [facility]'s in-office and virtual care options:  - Get Care Now

## 2024-03-12 NOTE — Progress Notes (Signed)
 Virtual Visit Consent   Gail Gonzalez, you are scheduled for a virtual visit with a [facility] provider today. Just as with appointments in the office, your consent must be obtained to participate. Your consent will be active for this visit and any virtual visit you may have with one of our providers in the next 365 days. If you have a MyChart account, a copy of this consent can be sent to you electronically.  As this is a virtual visit, video technology does not allow for your provider to perform a traditional examination. This may limit your provider's ability to fully assess your condition. If your provider identifies any concerns that need to be evaluated in person or the need to arrange testing (such as labs, EKG, etc.), we will make arrangements to do so. Although advances in technology are sophisticated, we cannot ensure that it will always work on either your end or our end. If the connection with a video visit is poor, the visit may have to be switched to a telephone visit. With either a video or telephone visit, we are not always able to ensure that we have a secure connection.  By engaging in this virtual visit, you consent to the provision of healthcare and authorize for your insurance to be billed (if applicable) for the services provided during this visit. Depending on your insurance coverage, you may receive a charge related to this service.  I need to obtain your verbal consent now. Are you willing to proceed with your visit today? Gail Gonzalez has provided verbal consent on 03/12/2024 for a virtual visit (video or telephone). Collins Dean, NP  Date: 03/12/2024 9:04 AM   Virtual Visit via Video Note   I, Collins Dean, connected with  Gail Gonzalez  ([id number], [birth year]) on 03/12/24 at  9:00 AM EDT by a video-enabled telemedicine application and verified that I am speaking with the correct person using two identifiers.  Location: Patient: Virtual Visit Location Patient:  Home Provider: Virtual Visit Location Provider: Home Office   I discussed the limitations of evaluation and management by telemedicine and the availability of in person appointments. The patient expressed understanding and agreed to proceed.    History of Present Illness: Gail Gonzalez is a 63 y.o. who identifies as a female who was assigned female at birth, and is being seen today for acute low back pain without sciatica.  Ms. Trumbo states she pulled her back out picking up a wooden stand last weekend. She is unable to sit, sit to stand, or stand for long periods of time without significant pain. 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   Hot flashes, menopausal 10/12/2023   Chronic pain of left knee 06/18/2023   Rhinorrhea 06/18/2023   Pain of right lower extremity 01/06/2023   Fatigue 11/25/2022   Grief counseling 02/02/2022   Anxiety disorder 08/18/2020   Uterine leiomyoma 11/18/2017   Preventative health care 10/11/2016   Tobacco use disorder 10/10/2016   Dyslipidemia 10/10/2016   Hyperglycemia 10/10/2016   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   Seborrheic keratosis 03/06/2016   Back pain 03/06/2016   Vitamin D  deficiency 06/11/2015   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   Baker's cyst, unruptured 12/30/2012   GERD (gastroesophageal reflux disease) 07/29/2012   Endometriosis 07/29/2012   Thyroid  nodule 07/29/2012   S/P left breast biopsy 07/29/2012  Allergies: No Known Allergies Medications:  Current Outpatient Medications:    atorvastatin  (LIPITOR) 10 MG tablet, TAKE 1 TABLET BY MOUTH EVERY DAY *NEED UPDATED INS*, Disp: 90 tablet, Rfl: 1   cetirizine  (ZYRTEC ) 10 MG tablet, Take 1 tablet (10 mg total) by mouth daily as needed for allergies., Disp: 30 tablet, Rfl: 11   cyclobenzaprine  (FLEXERIL ) 10 MG tablet, Take 1 tablet (10 mg  total) by mouth 2 (two) times daily as needed for muscle spasms., Disp: 20 tablet, Rfl: 0   escitalopram  (LEXAPRO ) 20 MG tablet, TAKE 1 TABLET BY MOUTH EVERY DAY, Disp: 90 tablet, Rfl: 1   famotidine  (PEPCID ) 20 MG tablet, TAKE 1 TABLET(20 MG) BY MOUTH TWICE DAILY, Disp: 180 tablet, Rfl: 1   fluticasone  (FLONASE ) 50 MCG/ACT nasal spray, Place 2 sprays into both nostrils daily., Disp: 16 g, Rfl: 6   hydrOXYzine  (ATARAX ) 10 MG tablet, TAKE 1/2 TABLET(5 MG) BY MOUTH THREE TIMES DAILY AS NEEDED, Disp: 30 tablet, Rfl: 0   meloxicam  (MOBIC ) 15 MG tablet, Take 1 tablet (15 mg total) by mouth daily., Disp: 30 tablet, Rfl: 2   methocarbamol  (ROBAXIN ) 500 MG tablet, Take 1 tablet (500 mg total) by mouth 2 (two) times daily., Disp: 30 tablet, Rfl: 0   Multiple Vitamin (MULTIVITAMIN) capsule, Take 1 capsule by mouth daily., Disp: , Rfl:    Omega-3 1000 MG CAPS, Take by mouth., Disp: , Rfl:    predniSONE  (DELTASONE ) 20 MG tablet, Take 2 tablets (40 mg total) by mouth daily with breakfast for 5 days., Disp: 10 tablet, Rfl: 0   Probiotic Product (PROBIOTIC DAILY PO), Take by mouth., Disp: , Rfl:    valACYclovir  (VALTREX ) 1000 MG tablet, Take 1 tablet (1,000 mg total) by mouth 2 (two) times daily. For 1 week, if cold sore begins to appear, Disp: 28 tablet, Rfl: 1  Observations/Objective: Patient is well-developed, well-nourished in no acute distress.  Resting comfortably at home.  Head is normocephalic, atraumatic.  No labored breathing.  Speech is clear and coherent with logical content.  Patient is alert and oriented at baseline.    Assessment and Plan: 1. Bilateral low back pain, unspecified chronicity, unspecified whether sciatica present (Primary) - predniSONE  (DELTASONE ) 20 MG tablet; Take 2 tablets (40 mg total) by mouth daily with breakfast for 5 days.  Dispense: 10 tablet; Refill: 0 - methocarbamol  (ROBAXIN ) 500 MG tablet; Take 1 tablet (500 mg total) by mouth 2 (two) times daily.  Dispense: 30  tablet; Refill: 0   Follow Up Instructions: I discussed the assessment and treatment plan with the patient. The patient was provided an opportunity to ask questions and all were answered. The patient agreed with the plan and demonstrated an understanding of the instructions.  A copy of instructions were sent to the patient via MyChart unless otherwise noted below.    The patient was advised to call back or seek an in-person evaluation if the symptoms worsen or if the condition fails to improve as anticipated.    Huxley Shurley W Namrata Dangler, NP

## 2024-03-16 ENCOUNTER — Encounter: Payer: Self-pay | Admitting: Physical Therapy

## 2024-03-16 ENCOUNTER — Ambulatory Visit: Admitting: Physical Therapy

## 2024-03-16 DIAGNOSIS — M6281 Muscle weakness (generalized): Secondary | ICD-10-CM | POA: Diagnosis not present

## 2024-03-16 DIAGNOSIS — M5459 Other low back pain: Secondary | ICD-10-CM

## 2024-03-16 NOTE — Therapy (Signed)
 OUTPATIENT PHYSICAL THERAPY LOWER EXTREMITY TREATMENT   Patient Name: Gail Gonzalez MRN: [id number] DOB:1961-05-25, 63 y.o., female Today's Date: 03/16/2024  END OF SESSION:  PT End of Session - 03/16/24 0935     Visit Number 13    Number of Visits 17    Date for PT Re-Evaluation 03/25/24    Authorization Type [employer]    Authorization - Visit Number 13    Authorization - Number of Visits 27    PT Start Time 0930    PT Stop Time 1015    PT Time Calculation (min) 45 min    Activity Tolerance Patient tolerated treatment well    Behavior During Therapy [facility] for tasks assessed/performed                Past Medical History:  Diagnosis Date   Anemia    Anxiety    Arthritis    Cardiac arrhythmia due to congenital heart disease    Cataract    Chronic back pain    Colon polyps 04/24/2013   Diverticulosis 02/24/2013   Endometriosis    on lap surgery. seen by Dr. Adelene Homer   Fatty liver    GERD (gastroesophageal reflux disease)    H. 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   Hot flashes, menopausal 10/12/2023   Chronic pain of left knee 06/18/2023   Rhinorrhea 06/18/2023   Pain of right lower extremity 01/06/2023   Fatigue 11/25/2022   Grief counseling 02/02/2022   Anxiety disorder 08/18/2020   Uterine leiomyoma 11/18/2017   Preventative health care 10/11/2016   Tobacco use disorder 10/10/2016   Dyslipidemia 10/10/2016   Hyperglycemia 10/10/2016   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  Rationale for Evaluation and Treatment: Rehabilitation  ONSET DATE: 01/18/24  SUBJECTIVE:   SUBJECTIVE STATEMENT: Pt "threw her back" out last week.  It is no longer bothering her.  No knee pain or limitations.   PERTINENT HISTORY: See PMH  PAIN:  Are you having pain? Yes: NPRS scale: 0/10 Pain location: L knee Pain description: Ache Aggravating factors: Bending the knee Relieving factors: Pain medication, cold pack  PRECAUTIONS: Chronic back pain  RED FLAGS: None   WEIGHT BEARING RESTRICTIONS: No WBAT  FALLS:  Has patient fallen in last 6 months? 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 Eventually being able to dance.  NEXT MD VISIT: To call and schedule a post surgical appt c Dr Yvonne Hering  OBJECTIVE:  Note: Objective measures were completed at Evaluation unless otherwise noted.  DIAGNOSTIC FINDINGS:  10/30/23 MRI L knee IMPRESSION: 1. Complex tear of the anterior horn of the lateral meniscus and peripheral meniscal extrusion. 14 mm cystic mass anterior to the anterior horn lateral meniscus likely reflecting a parameniscal cyst. 2. High-grade partial-thickness cartilage loss of the medial patellar facet and patellar apex with mild subchondral marrow edema in the patellar apex. 3. Mild partial-thickness  cartilage loss of the weight-bearing lateral femorotibial compartment.  PATIENT SURVEYS:  LEFS 15/80= 19% or 81% limitation LEFS 60/80 , =75%  COGNITION: Overall cognitive status: Within functional limits for tasks assessed     SENSATION: WFL  EDEMA:  Present- covered by post surgical dressing  MUSCLE LENGTH: Hamstrings: Right NT deg; Left NT deg Andy Bannister test: Right NT deg; Left NT deg  POSTURE: rounded shoulders and forward head  PALPATION: TTP peri L knee. Wrapped with ace bandage and dressing in place  LOWER EXTREMITY ROM:  Active ROM Right eval Left eval LT 01/28/24 LT 02/10/24 LT 03/03/24  Hip flexion       Hip extension       Hip abduction       Hip adduction       Hip internal rotation       Hip external rotation       Knee flexion  90 120 130   Knee extension  4 lacking 2 lacking 2 lacking 2 lacking  Ankle dorsiflexion       Ankle plantarflexion       Ankle inversion       Ankle eversion        (Blank rows = not tested)  LOWER EXTREMITY MMT:  MMT Right eval Left eval Lt.  03/07/24  Hip flexion 5 4   Hip extension     Hip abduction 5 4   Hip adduction     Hip internal rotation     Hip external rotation 5 4   Knee flexion 5 NT 5/5  Knee extension 5 NT 4+/5, min pain   Ankle dorsiflexion     Ankle plantarflexion     Ankle inversion     Ankle eversion      (Blank rows = not tested)  LOWER EXTREMITY SPECIAL TESTS:  NT  FUNCTIONAL TESTS: Assess when pt is able to walk without an assist device 5 times sit to stand: TBA 2 minute walk test: TBA 12.6 sec and 592 feet   GAIT: Distance walked: 200' Assistive device utilized: Crutches Level of assistance: Modified independence Comments: Swing through pattern                                                                                                                   TREATMENT DATE:     [facility] Adult PT Treatment:  9 weeks                         DATE:  03/16/24 Therapeutic Exercise: NuStep LE and UE for 7 min  SLR 2 x 12 toe up then toes out  Adduction/Abduct in sidelying x 10  Bridge with ball x 15  Step up 9 inch 1 UE x 15  Reverse step up (glutes)  Step downs Rt and Lt 4 inch no UE assist  Leg press 2 plates x 15 Leg press 3 plates x 15   [facility] Adult PT Treatment:                                                DATE: 03/07/24 Therapeutic Exercise: TM up to 2.5 mph, 4% grade LAQ 5 lbs  Wall sit cues needed  Wall sit 30 sec Lt LE focus  Step up 8 inch (min to no pain) No UEs  SLS with 10 lbs KB 30 sec  SLS with 5 lbs KB pass, cues  Tandem stance "around the world" 30 sec each direction, each LE  Step downs 1 UE 4 inch  Leg press 1 -2 plate double leg x 15, then single leg x 10 each  SLR propped on forearms Hamstring and ITB stretching    PATIENT EDUCATION:  Education details: Eval findings, POC, HEP, self care  Person educated: Patient Education method: Explanation, Demonstration, Tactile cues, Verbal cues, and Handouts Education comprehension: verbalized understanding, returned demonstration, verbal cues required, and tactile cues required  HOME EXERCISE PROGRAM: Access Code: [email or web address] URL: [email or web address] Date: 02/10/2024 Prepared by: Liborio Reeds  Exercises - Supine Heel Slide  - 2-3 x daily - 7 x weekly - 10 reps - 5 hold - Supine Quad Set  - 2-3 x daily - 7 x weekly - 1 sets - 10 reps - 5 hold - Active Straight Leg Raise with Quad Set  - 2-3 x daily - 7 x weekly - 1 sets - 10 reps - 5 hold - Sidelying Hip Abduction  - 2-3 x daily - 7 x weekly - 1 sets - 10 reps - 3 hold - Hooklying Clamshell with Resistance  - 2-3 x daily - 7 x weekly - 1 sets - 10 reps - 3 hold - Supine Hip Adduction Isometric with Ball  - 2-3 x daily - 7 x weekly - 1 sets - 10 reps - 3 hold - Supine Knee Extension Strengthening  - 2-3 x daily - 7 x weekly - 1 sets - 10 reps - 3 hold - Supine Single Leg Ankle Pumps  - 2 x daily  - 7 x weekly - 1 sets - 10 reps - Supine Ankle Circles  - 2 x daily - 7 x weekly - 1 sets - 10 reps - Seated Table Hamstring Stretch  - 1 x daily - 7 x weekly - 1 sets - 3-5 reps - 30 hold  ASSESSMENT:  CLINICAL IMPRESSION:  Pt continues to do well with respect to her knee function strength.  She was able to exercise without increasing her back pain, used caution to keep that in mind as she recovers from a flare up.  She has 1 more appt and will be done with PT.  She continues to have some fatigue with standing exercises requiring knee control and  Pt will continue to benefit from skilled PT to address impairments for improved L knee/LE function.     EVAL: Patient is a 63 y.o. female who was seen today for physical therapy evaluation and treatment for Left knee arthroscopy with lateral meniscectomy 01/18/24 .    OBJECTIVE IMPAIRMENTS: Abnormal gait, decreased activity tolerance, difficulty walking, decreased ROM, decreased strength, increased edema, and pain.   ACTIVITY LIMITATIONS: carrying, lifting, bending, sitting, standing, squatting, sleeping, stairs, transfers, bed mobility, bathing, toileting, dressing, locomotion level, and caring for others  PARTICIPATION LIMITATIONS: meal prep, cleaning, laundry, driving, shopping, community activity, and occupation  PERSONAL FACTORS: Past/current experiences and Time since onset of injury/illness/exacerbation Chronic back pain, are also affecting patient's functional outcome.   REHAB POTENTIAL: Good  CLINICAL DECISION MAKING: Evolving/moderate complexity  EVALUATION COMPLEXITY: Moderate   GOALS:  SHORT TERM GOALS: Target date: 02/12/24 Pt will be Ind in an initial HEP  Baseline: started Goal status: MET  2.  Pt will voice understanding of measures to assist in pain and swelling reduction  Baseline: started Goal status: MET   3.  L knee AROM will increase to 0-115d for good functional outcome Baseline: 4-90, 0-4-126 flexion  Goal  status: ongoing , met for flexion   LONG TERM GOALS: Target date: 03/25/24  Pt will be Ind in a final HEP to maintain achieved LOF  Baseline: started Goal status: ongoing   2.  L LE will demonstrated 5/5 strength for improved function with ADLs Baseline: see flow sheet Goal status: ongoing   3.  L knee will demonstrate 0-125d of AROM for appropriate function with sitting, ascending/descending steps, ambulation Baseline: 4-90d 5/8/25L lacks a few degrees of extension Goal status: ongoing   4.  Improve 5xSTS by MCID of 5" and by MCID of 84ft as indication of improved functional mobility  Baseline: 12.6 sec and 592 feet  Goal status: ongoing   5.  Pt will be able to ascend and descend 18 steps c HR assist for home and community mobility Baseline: assist by crutches , does one step at a time  03/03/24- pain on decent  Goal status: ongoing   6.  Pt will be able to walk with a normalized gait pattern 1000' for community mobility and to progress pt toward return to dance Baseline: Assist by crutches Goal status: MET    PLAN:  PT FREQUENCY: 2x/week  PT DURATION: 8 weeks  PLANNED INTERVENTIONS: 97164- PT Re-evaluation, 97110-Therapeutic exercises, 97530- Therapeutic activity, [id number]- Neuromuscular re-education, 97535- Self Care, 16109- Manual therapy, [id number]- Gait training, [phone]- Electrical stimulation (unattended), 97016- Vasopneumatic device, Patient/Family education, Balance training, Stair training, Taping, Dry Needling, Joint mobilization, Cryotherapy, and Moist heat  PLAN FOR NEXT SESSION: DC, Re-check STS, 2 min walk.  Quads.  Assess response to HEP; progress therex as indicated; use of modalities, manual therapy; and TPDN as indicated.    Marci Setter, PT 03/16/24 9:41 AM Phone: [phone] Fax: [phone]

## 2024-03-17 ENCOUNTER — Telehealth: Payer: Self-pay

## 2024-03-17 ENCOUNTER — Ambulatory Visit: Admitting: Gastroenterology

## 2024-03-17 ENCOUNTER — Encounter: Payer: Self-pay | Admitting: Student

## 2024-03-17 NOTE — Telephone Encounter (Signed)
Sent patient message through MyChart.

## 2024-03-17 NOTE — Telephone Encounter (Signed)
 Pt called referral line and LVM asking for a referral for Mental Health. Please advise.  Thank you! Blenda Burdock

## 2024-03-24 ENCOUNTER — Encounter: Payer: Self-pay | Admitting: Physical Therapy

## 2024-03-24 ENCOUNTER — Ambulatory Visit: Admitting: Physical Therapy

## 2024-03-24 DIAGNOSIS — M6281 Muscle weakness (generalized): Secondary | ICD-10-CM | POA: Diagnosis not present

## 2024-03-24 DIAGNOSIS — M5459 Other low back pain: Secondary | ICD-10-CM | POA: Diagnosis not present

## 2024-03-24 NOTE — Therapy (Signed)
 OUTPATIENT PHYSICAL THERAPY LOWER EXTREMITY TREATMENT   Patient Name: Gail Gonzalez MRN: [id number] DOB:08-Apr-1961, 63 y.o., female Today's Date: 03/24/2024   PHYSICAL THERAPY DISCHARGE SUMMARY  Visits from Start of Care: 14  Current functional level related to goals / functional outcomes: See below for details   Remaining deficits: None limiting current function   Education / Equipment: Home exercise program, lower extremity alignment, self-care  Patient agrees to discharge. Patient goals were met. Patient is being discharged due to meeting the stated rehab goals.  END OF SESSION:  PT End of Session - 03/24/24 1024     Visit Number 14    Number of Visits 17    Date for PT Re-Evaluation 03/25/24    Authorization Type [employer]    Authorization - Visit Number 14    Authorization - Number of Visits 27    PT Start Time 1018    PT Stop Time 1057    PT Time Calculation (min) 39 min    Activity Tolerance Patient tolerated treatment well    Behavior During Therapy WFL for tasks assessed/performed                Past Medical History:  Diagnosis Date   Anemia    Anxiety    Arthritis    Cardiac arrhythmia due to congenital heart disease    Cataract    Chronic back pain    Colon polyps 04/24/2013   Diverticulosis 02/24/2013   Endometriosis    on lap surgery. seen by Dr. Adelene Homer   Fatty liver    GERD (gastroesophageal reflux disease)    H. 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   Hot flashes, menopausal 10/12/2023   Chronic pain of left knee 06/18/2023   Rhinorrhea 06/18/2023   Pain of right lower extremity  01/06/2023   Fatigue 11/25/2022   Grief counseling 02/02/2022   Anxiety disorder 08/18/2020   Uterine leiomyoma 11/18/2017   Preventative health care 10/11/2016   Tobacco use disorder 10/10/2016   Dyslipidemia 10/10/2016   Hyperglycemia 10/10/2016   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   Seborrheic keratosis 03/06/2016   Back pain 03/06/2016   Vitamin D  deficiency 06/11/2015   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   Baker's cyst, unruptured 12/30/2012   GERD (gastroesophageal reflux disease) 07/29/2012   Endometriosis 07/29/2012   Thyroid  nodule 07/29/2012   S/P left breast biopsy 07/29/2012    PCP: Wilhemena Harbour, MD   REFERRING PROVIDER: Micheline Ahr, MD   REFERRING DIAG: Left knee arthroscopy with lateral meniscectomy 01/18/24   THERAPY DIAG:  Muscle weakness  Other low back pain  Rationale for Evaluation and Treatment: Rehabilitation  ONSET DATE: 01/18/24  SUBJECTIVE:   SUBJECTIVE STATEMENT: No knee pain .  I felt a big pop today but its ok.  Back pain is resolved. Ready for DC.   PERTINENT HISTORY: See PMH  PAIN:  Are you having pain? Yes: NPRS scale: 0/10 Pain location: L knee Pain description: Ache Aggravating factors: Bending the knee Relieving factors: Pain medication, cold pack  PRECAUTIONS: Chronic back  pain  RED FLAGS: None   WEIGHT BEARING RESTRICTIONS: No WBAT  FALLS:  Has patient fallen in last 6 months? 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 Eventually being able to dance.  NEXT MD VISIT: To call and schedule a post surgical appt c Dr Yvonne Hering  OBJECTIVE:  Note: Objective measures were completed at Evaluation unless otherwise  noted.  DIAGNOSTIC FINDINGS:  10/30/23 MRI L knee IMPRESSION: 1. Complex tear of the anterior horn of the lateral meniscus and peripheral meniscal extrusion. 14 mm cystic mass anterior to the anterior horn lateral meniscus likely reflecting a parameniscal cyst. 2. High-grade partial-thickness cartilage loss of the medial patellar facet and patellar apex with mild subchondral marrow edema in the patellar apex. 3. Mild partial-thickness cartilage loss of the weight-bearing lateral femorotibial compartment.  PATIENT SURVEYS:  LEFS 15/80= 19% or 81% limitation LEFS 60/80 , =75%  COGNITION: Overall cognitive status: Within functional limits for tasks assessed     SENSATION: WFL  EDEMA:  Present- covered by post surgical dressing  MUSCLE LENGTH: Hamstrings: Right NT deg; Left NT deg Gail Gonzalez test: Right NT deg; Left NT deg  POSTURE: rounded shoulders and forward head  PALPATION: TTP peri L knee. Wrapped with ace bandage and dressing in place  LOWER EXTREMITY ROM:  Active ROM Right eval Left eval LT 01/28/24 LT 02/10/24 LT 03/03/24 L.t  03/24/24  Hip flexion        Hip extension        Hip abduction        Hip adduction        Hip internal rotation        Hip external rotation        Knee flexion  90 120 130  140  Knee extension  4 lacking 2 lacking 2 lacking 2 lacking 2  Ankle dorsiflexion        Ankle plantarflexion        Ankle inversion        Ankle eversion         (Blank rows = not tested)  LOWER EXTREMITY MMT:  MMT Right eval Left eval Lt.  03/07/24 Lt.  03/24/24  Hip flexion 5 4    Hip extension      Hip abduction 5 4    Hip adduction      Hip internal rotation      Hip external rotation 5 4    Knee flexion 5 NT 5/5   Knee extension 5 NT 4+/5, min pain    Ankle dorsiflexion      Ankle plantarflexion      Ankle inversion      Ankle eversion       (Blank rows = not tested)  LOWER EXTREMITY SPECIAL TESTS:  NT  FUNCTIONAL TESTS: Assess when pt is  able to walk without an assist device 5 times sit to stand: TBA 2 minute walk test: TBA 12.6 sec and 592 feet  Refer retest due to high score GAIT: Distance walked: 200' Assistive device utilized: Crutches Level of assistance: Modified independence Comments: Swing through pattern  TREATMENT DATE:    [facility] Adult PT Treatment:                                                DATE: 03/24/24 Therapeutic Exercise: Recumbent bike L 2 for 5 min  Standing heel raises double and single  Step up 6 inch x 15 (runner's) added 10 lbs at chest 2nd set Hopping, double and single leg  Lateral step up to hip abd  Lateral walking with 10 lbs  DB  Step down 4 inch step x 10 Single-leg bridge Double leg bridge with blue band Self Care: Discussed goals , upgrade and prioritized home exercise program   PATIENT EDUCATION:  Education details: Eval findings, POC, HEP, self care  Person educated: Patient Education method: Explanation, Demonstration, Tactile cues, Verbal cues, and Handouts Education comprehension: verbalized understanding, returned demonstration, verbal cues required, and tactile cues required  HOME EXERCISE PROGRAM: Access Code: [email or web address] URL: [email or web address] Date: 02/10/2024 Prepared by: Liborio Reeds Access Code: [email or web address] URL: [email or web address] Date: 03/24/2024 Prepared by: Marci Setter  Exercises - Active Straight Leg Raise with Quad Set  - 2-3 x daily - 7 x weekly - 1 sets - 10 reps - 5 hold - Sidelying Hip Abduction  - 2-3 x daily - 7 x weekly - 1 sets - 10 reps - 3 hold - Hooklying Clamshell with Resistance  - 2-3 x daily - 7 x weekly - 1 sets - 10 reps - 3 hold - Seated Table Hamstring Stretch  - 1 x daily - 7 x weekly - 1 sets - 3-5 reps - 30 hold - Sit- to stand with one foot forward  - 1 x daily - 7 x weekly - 2 sets - 10  reps - Sitting Knee Extension with Resistance  - 1 x daily - 7 x weekly - 2 sets - 10 reps - Wall Quarter Squat  - 1 x daily - 7 x weekly - 1 sets - 5 reps - 30 hold - Supine Bridge  - 1 x daily - 7 x weekly - 2 sets - 10 reps - 5 hold - Runner's Step Up/Down  - 1 x daily - 7 x weekly - 2 sets - 10 reps - 3 hold - Side Stepping with Resistance at Thighs  - 1 x daily - 7 x weekly - 2 sets - 10 reps - Single Leg Heel Raise with Counter Support  - 1 x daily - 7 x weekly - 2 sets - 10 reps  ASSESSMENT:  CLINICAL IMPRESSION: Shanah has met nearly all of her goals she has excellent range of motion and strength has progressed to within functional limits.  She asked about walking and running and hopping today.  Informed her that running involves increased power in her left leg.  Performing a standing heel raise on her left leg will be limited to about 4 or 5 repetitions.  Added this to her home program so she could continue to work on.  She was able to hop double legs without difficulty but did have some difficulty on the single-leg.  She is discharged from therapy at this time  EVAL: Patient is a 63 y.o. female who was seen today for physical therapy evaluation and treatment for Left knee arthroscopy with lateral meniscectomy 01/18/24 .    OBJECTIVE IMPAIRMENTS: Abnormal gait, decreased activity tolerance, difficulty  walking, decreased ROM, decreased strength, increased edema, and pain.   ACTIVITY LIMITATIONS: carrying, lifting, bending, sitting, standing, squatting, sleeping, stairs, transfers, bed mobility, bathing, toileting, dressing, locomotion level, and caring for others  PARTICIPATION LIMITATIONS: meal prep, cleaning, laundry, driving, shopping, community activity, and occupation  PERSONAL FACTORS: Past/current experiences and Time since onset of injury/illness/exacerbation Chronic back pain, are also affecting patient's functional outcome.   REHAB POTENTIAL: Good  CLINICAL DECISION MAKING:  Evolving/moderate complexity  EVALUATION COMPLEXITY: Moderate   GOALS:  SHORT TERM GOALS: Target date: 02/12/24 Pt will be Ind in an initial HEP  Baseline: started Goal status: MET  2.  Pt will voice understanding of measures to assist in pain and swelling reduction  Baseline: started Goal status: MET   3.  L knee AROM will increase to 0-115d for good functional outcome Baseline: 4-90, 0-4-126 flexion  Goal status: ongoing , met for flexion   LONG TERM GOALS: Target date: 03/25/24  Pt will be Ind in a final HEP to maintain achieved LOF  Baseline: started Goal status: met   2.  L LE will demonstrated 5/5 strength for improved function with ADLs Baseline: see flow sheet Goal status: met   3.  L knee will demonstrate 0-125d of AROM for appropriate function with sitting, ascending/descending steps, ambulation Baseline: 4-90d 5/8/25L lacks a few degrees of extension Goal status: met 0-2-140 deg   4.  Improve 5xSTS by MCID of 5" and by MCID of 22ft as indication of improved functional mobility  Baseline: 12.6 sec and 592 feet  Goal status: deferred due to high level score   5.  Pt will be able to ascend and descend 18 steps c HR assist for home and community mobility Baseline: assist by crutches , does one step at a time  03/03/24- pain on decent  Goal status: MET   6.  Pt will be able to walk with a normalized gait pattern 1000' for community mobility and to progress pt toward return to dance Baseline: Assist by crutches Goal status: MET    PLAN:  PT FREQUENCY: 2x/week  PT DURATION: 8 weeks  PLANNED INTERVENTIONS: 97164- PT Re-evaluation, 97110-Therapeutic exercises, 97530- Therapeutic activity, 97112- Neuromuscular re-education, 97535- Self Care, 62952- Manual therapy, [phone]- Gait training, [phone]- Electrical stimulation (unattended), 97016- Vasopneumatic device, Patient/Family education, Balance training, Stair training, Taping, Dry Needling, Joint mobilization,  Cryotherapy, and Moist heat  PLAN FOR NEXT SESSION: Baltazar Bonier, PT 03/24/24 11:01 AM Phone: [phone] Fax: [phone]

## 2024-03-28 ENCOUNTER — Other Ambulatory Visit: Payer: Self-pay | Admitting: Nurse Practitioner

## 2024-03-28 DIAGNOSIS — M545 Low back pain, unspecified: Secondary | ICD-10-CM

## 2024-04-04 ENCOUNTER — Ambulatory Visit (INDEPENDENT_AMBULATORY_CARE_PROVIDER_SITE_OTHER): Admitting: Gastroenterology

## 2024-04-04 ENCOUNTER — Encounter: Payer: Self-pay | Admitting: Gastroenterology

## 2024-04-04 ENCOUNTER — Other Ambulatory Visit (INDEPENDENT_AMBULATORY_CARE_PROVIDER_SITE_OTHER)

## 2024-04-04 ENCOUNTER — Ambulatory Visit: Payer: Self-pay | Admitting: Gastroenterology

## 2024-04-04 VITALS — BP 106/50 | HR 88 | Ht 67.75 in

## 2024-04-04 DIAGNOSIS — K580 Irritable bowel syndrome with diarrhea: Secondary | ICD-10-CM

## 2024-04-04 DIAGNOSIS — K760 Fatty (change of) liver, not elsewhere classified: Secondary | ICD-10-CM

## 2024-04-04 LAB — CBC WITH DIFFERENTIAL/PLATELET
Basophils Absolute: 0 10*3/uL (ref 0.0–0.1)
Basophils Relative: 0.9 % (ref 0.0–3.0)
Eosinophils Absolute: 0.1 10*3/uL (ref 0.0–0.7)
Eosinophils Relative: 1.6 % (ref 0.0–5.0)
HCT: 38.1 % (ref 36.0–46.0)
Hemoglobin: 12.8 g/dL (ref 12.0–15.0)
Lymphocytes Relative: 45 % (ref 12.0–46.0)
Lymphs Abs: 2.4 10*3/uL (ref 0.7–4.0)
MCHC: 33.7 g/dL (ref 30.0–36.0)
MCV: 93.3 fl (ref 78.0–100.0)
Monocytes Absolute: 0.5 10*3/uL (ref 0.1–1.0)
Monocytes Relative: 10 % (ref 3.0–12.0)
Neutro Abs: 2.3 10*3/uL (ref 1.4–7.7)
Neutrophils Relative %: 42.5 % — ABNORMAL LOW (ref 43.0–77.0)
Platelets: 321 10*3/uL (ref 150.0–400.0)
RBC: 4.09 Mil/uL (ref 3.87–5.11)
RDW: 13.4 % (ref 11.5–15.5)
WBC: 5.3 10*3/uL (ref 4.0–10.5)

## 2024-04-04 LAB — COMPREHENSIVE METABOLIC PANEL WITH GFR
ALT: 16 U/L (ref 0–35)
AST: 18 U/L (ref 0–37)
Albumin: 4.4 g/dL (ref 3.5–5.2)
Alkaline Phosphatase: 51 U/L (ref 39–117)
BUN: 14 mg/dL (ref 6–23)
CO2: 29 meq/L (ref 19–32)
Calcium: 9.5 mg/dL (ref 8.4–10.5)
Chloride: 104 meq/L (ref 96–112)
Creatinine, Ser: 0.9 mg/dL (ref 0.40–1.20)
GFR: 68.36 mL/min (ref >60.00)
Glucose, Bld: 95 mg/dL (ref 70–99)
Potassium: 4 meq/L (ref 3.5–5.1)
Sodium: 139 meq/L (ref 135–145)
Total Bilirubin: 0.6 mg/dL (ref 0.2–1.2)
Total Protein: 7.1 g/dL (ref 6.0–8.3)

## 2024-04-04 MED ORDER — DICYCLOMINE HCL 10 MG PO CAPS: 10.0000 mg | ORAL_CAPSULE | Freq: Three times a day (TID) | ORAL | 0 refills | Status: DC

## 2024-04-04 NOTE — Patient Instructions (Signed)
 Dicyclomine 10 mg 1 tablet 30 minutes before meals TID for abdominal spasms Recommend Low fodmap diet  Fatty liver Recommend maintaining healthy weight Exercise as tolerated 3-4 days a week Recommend Mediterranean diet  Your provider has requested that you go to the basement level for lab work before leaving today. Press "B" on the elevator. The lab is located at the first door on the left as you exit the elevator.    You have been scheduled for an abdominal ultrasound at [facility] (1st floor of hospital) on 04/06/24 at 11:00am. Please arrive 30 minutes prior to your appointment for registration. Make certain not to have anything to eat or drink 6 hours prior to your appointment. Should you need to reschedule your appointment, please contact radiology at [phone]. This test typically takes about 30 minutes to perform.  _______________________________________________________  If your blood pressure at your visit was 140/90 or greater, please contact your primary care physician to follow up on this.  _______________________________________________________  If you are age 63 or older, your body mass index should be between 23-30. Your Body mass index is 25.43 kg/m. If this is out of the aforementioned range listed, please consider follow up with your Primary Care Provider.  If you are age 60 or younger, your body mass index should be between 19-25. Your Body mass index is 25.43 kg/m. If this is out of the aformentioned range listed, please consider follow up with your Primary Care Provider.   ________________________________________________________  The [facility] providers would like to encourage you to use MYCHART to communicate with providers for non-urgent requests or questions.  Due to long hold times on the telephone, sending your provider a message by [facility] may be a faster and more efficient way to get a response.  Please allow 48 business hours for a response.   Please remember that this is for non-urgent requests.  _______________________________________________________   Thank you for trusting me with your gastrointestinal care. Deanna May, RNP

## 2024-04-04 NOTE — Progress Notes (Signed)
 Chief Complaint:follow-up abd pain, bloating Primary GI Doctor:Dr. Bridgett Camps  HPI:  63 y.o. female with a past medical history of anxiety, endometriosis 2013, GERD, diverticulosis and others listed below presents for evaluation of abdominal pain, bloating. Last seen by Mylinda Asa, PA on 06/24/22 for evaluation of bloating, nausea, diarrhea, GERD. EGD/colon ordered. Full report below.  GI procedures: 07/14/2022 EGD- Longitudinally marked, with circumferential folds in the mid and distal esophagus. Biopsied. - Normal stomach. Biopsied. - Normal examined duodenum. Biopsied. Path: There was mild inflammation in the small intestine but not consistent with celiac disease or gluten allergy.    07/14/2022 colonoscopy-- Two 4 to 5 mm polyps in the transverse colon, removed with a cold snare. Resected and retrieved. - Mild diverticulosis in the sigmoid colon and in the descending colon. - Small internal hemorrhoids. - The examination was otherwise normal. - Biopsies were taken with a cold forceps from the right colon and left colon for evaluation of microscopic colitis. Path: Random biopsies from the colon did not show colitis.The polyps removed were benign but precancerous. Recall 7 years. 04/21/2013 EGD with heartburn, dyspepsia and dysphagia nonobstructing Schatzki ring gastroesophageal junction, gastropathy entire stomach, single nonbleeding ulcer 3 x 5 mm duodenal bulb. Tested positive for H. pylori given Pylera.   03/24/2013 colonoscopy screening purposes good prep with movi prep 3 mm polyp sessile, mild diverticulosis sigmoid colon, moderate internal and external hemorrhoids recall 10 years.     Interval History    Patient presents with main complaint of LUQ pain on almost daily basis. She states she holds it until it goes away. She describes it as her muscles in her abdomen are spasming. She reports up to 3 loose stools per day. We discussed in detail irritable bowel syndrome per diagnosis on her last  EGD/colon with negative biopsies. She was unable to afford the Xifaxan  prescribed and enquires about other options.    She also has concerns about her history of fatty liver. She states she gained 30lbs over the past three years when she stopped smoking. She admits she eats a lot of fried foods and sweets like chocolate cake several days a week. History of alcohl use when she was younger, but states she stopped. Only has 1 glass of wine per week. She recently had knee surgery so hasn't been doing a lot of exercise. 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  Wt Readings from Last 3 Encounters:  02/24/24 166 lb (75.3 kg)  10/26/23 162 lb (73.5 kg)  10/12/23 162 lb 12.8 oz (73.8 kg)    Past Medical History:  Diagnosis Date   Anemia    Anxiety    Arthritis    Cardiac arrhythmia due to congenital heart disease    Cataract    Chronic back pain    Colon polyps 04/24/2013   Diverticulosis 02/24/2013   Endometriosis    on lap surgery. seen by Dr. Adelene Homer   Fatty liver    GERD (gastroesophageal reflux disease)    H. pylori infection    Heart murmur    Hemorrhoids    Internal hemorrhoids    Rectal bleeding    Thyroid  disease 10/27/2010   nodule   Tubular adenoma of colon     Past Surgical History:  Procedure Laterality Date   BIOPSY THYROID   10/28/2011   BREAST BIOPSY Left    DIAGNOSTIC LAPAROSCOPY  10/27/1988   endometriosis   MENISCUS REPAIR Left    and cyst removal    Current  Outpatient Medications  Medication Sig Dispense Refill   Aspirin 81 MG CAPS Take 1 tablet by mouth daily.     atorvastatin  (LIPITOR) 10 MG tablet TAKE 1 TABLET BY MOUTH EVERY DAY *NEED UPDATED INS* 90 tablet 1   celecoxib (CELEBREX) 100 MG capsule Take 100 mg by mouth as needed.     cyclobenzaprine  (FLEXERIL ) 10 MG tablet Take 1 tablet (10 mg total) by mouth 2 (two) times daily as needed for muscle spasms. 20 tablet 0   dicyclomine (BENTYL) 10 MG capsule Take 1 capsule (10 mg total) by  mouth 4 (four) times daily -  before meals and at bedtime. 90 capsule 0   escitalopram  (LEXAPRO ) 20 MG tablet TAKE 1 TABLET BY MOUTH EVERY DAY 90 tablet 1   famotidine  (PEPCID ) 20 MG tablet TAKE 1 TABLET(20 MG) BY MOUTH TWICE DAILY 180 tablet 1   hydrOXYzine  (ATARAX ) 10 MG tablet TAKE 1/2 TABLET(5 MG) BY MOUTH THREE TIMES DAILY AS NEEDED 30 tablet 0   methocarbamol  (ROBAXIN ) 500 MG tablet Take 1 tablet (500 mg total) by mouth 2 (two) times daily. 30 tablet 0   Multiple Vitamin (MULTIVITAMIN) capsule Take 1 capsule by mouth daily.     Omega-3 1000 MG CAPS Take by mouth.     Probiotic Product (PROBIOTIC DAILY PO) Take by mouth.     valACYclovir  (VALTREX ) 1000 MG tablet Take 1 tablet (1,000 mg total) by mouth 2 (two) times daily. For 1 week, if cold sore begins to appear 28 tablet 1   ondansetron  (ZOFRAN -ODT) 4 MG disintegrating tablet Take 4 mg by mouth as needed. (Patient not taking: Reported on 04/04/2024)     No current facility-administered medications for this visit.    Allergies as of 04/04/2024   (No Known Allergies)    Family History  Problem Relation Age of Onset   Alcohol abuse Mother    Cancer Mother        breast   Diabetes Mother    Hypertension Mother    Colon polyps Mother    Alcohol abuse Father    Prostate cancer Brother    Uterine cancer Maternal Aunt    Colon cancer Maternal Uncle    Esophageal cancer Neg Hx    Rectal cancer Neg Hx    Stomach cancer Neg Hx     Review of Systems:    Constitutional: No weight loss, fever, chills, weakness or fatigue HEENT: Eyes: No change in vision               Ears, Nose, Throat:  No change in hearing or congestion Skin: No rash or itching Cardiovascular: No chest pain, chest pressure or palpitations   Respiratory: No SOB or cough Gastrointestinal: See HPI and otherwise negative Genitourinary: No dysuria or change in urinary frequency Neurological: No headache, dizziness or syncope Musculoskeletal: No new muscle or joint  pain Hematologic: No bleeding or bruising Psychiatric: No history of depression or anxiety    Physical Exam:  Vital signs: BP (!) 106/50 (BP Location: Left Arm, Patient Position: Sitting, Cuff Size: Normal)   Pulse 88   Ht 5' 7.75" (1.721 m) Comment: height measured without shoes  BMI 25.43 kg/m   Constitutional:   Pleasant  female appears to be in NAD, Well developed, Well nourished, alert and cooperative Throat: Oral cavity and pharynx without inflammation, swelling or lesion.  Respiratory: Respirations even and unlabored. Lungs clear to auscultation bilaterally.   No wheezes, crackles, or rhonchi.  Cardiovascular: Normal S1, S2. Regular rate  and rhythm. No peripheral edema, cyanosis or pallor.  Gastrointestinal:  Soft, nondistended, generalized abdominal tenderness. No rebound or guarding. Normal bowel sounds. No appreciable masses or hepatomegaly. Rectal:  Not performed.  Msk:  Symmetrical without gross deformities. Without edema, no deformity or joint abnormality.  Neurologic:  Alert and  oriented x4;  grossly normal neurologically.  Skin:   Dry and intact without significant lesions or rashes. Psychiatric: Oriented to person, place and time. Demonstrates good judgement and reason without abnormal affect or behaviors.  RELEVANT LABS AND IMAGING: CBC    Latest Ref Rng & Units 05/19/2022   11:19 AM 06/11/2015    4:47 PM 08/22/2014    8:55 AM  CBC  WBC 4.0 - 10.5 K/uL 5.2  6.9  6.1   Hemoglobin 12.0 - 15.0 g/dL 78.2  95.6  21.3   Hematocrit 36.0 - 46.0 % 38.9  43.0  45.6   Platelets 150.0 - 400.0 K/uL 278.0  301  297      CMP     Latest Ref Rng & Units 05/19/2022   11:19 AM 11/22/2019    5:16 PM 06/11/2015    4:47 PM  CMP  Glucose 70 - 99 mg/dL 98  88  086   BUN 6 - 23 mg/dL 12  11  12    Creatinine 0.40 - 1.20 mg/dL 5.78  4.69  6.29   Sodium 135 - 145 mEq/L 135  144  139   Potassium 3.5 - 5.1 mEq/L 4.0  4.5  3.8   Chloride 96 - 112 mEq/L 102  106  105   CO2 19 - 32  mEq/L 27  25  26    Calcium  8.4 - 10.5 mg/dL 9.4  9.5  9.3   Total Protein 6.0 - 8.3 g/dL 7.1  6.8    Total Bilirubin 0.2 - 1.2 mg/dL 0.4  0.5    Alkaline Phos 39 - 117 U/L 52  64    AST 0 - 37 U/L 15  17    ALT 0 - 35 U/L 13  13       Lab Results  Component Value Date   TSH 1.250 06/27/2022     Assessment: Encounter Diagnoses  Name Primary?   Irritable bowel syndrome with diarrhea Yes   Fatty liver     63 year old female patient with workup for abdominal pain and diarrhea. Egd/colon in Sept 2023 was negative. No microscopic colitis on bx. She was prescribed Xifaxan  at that time for IBS-D however her insurance did not cover it. We discussed other affordable options today. Will start her on dicyclomine before meals along with a low fodmap diet. Sharnette Kitamura consider Viberzi in the future if no improvement in symptoms .   History of fatty liver incidentally found on abdominal US  August 2023. She wanted to follow-up on it to make sure it has not progressed. No recent lab work. Will check CBC and CMP along with RUQ abdominal ultrasound. Recommended Mediterrean diet, weight loss, and exercise as tolerated. If abnormal labs or imaging will consider further liver workup.   Plan: - Start Dicyclomine 10 mg 1 tablet 30 minutes before meals TID - Recommend Low fodmap diet - Recommend weight loss - Recommend physical exercise as tolerated -Check CBC, CMP today -Order RUQ abdominal ultrasound  -recall colonoscopy 06/2029  Thank you for the courtesy of this consult. Please call me with any questions or concerns.   Evan Mackie, FNP-C [facility] 04/04/2024, 2:09 PM  Cc: Wilhemena Harbour, MD

## 2024-04-05 ENCOUNTER — Encounter: Payer: Self-pay | Admitting: *Deleted

## 2024-04-05 NOTE — Progress Notes (Signed)
 Addendum: Reviewed and agree with assessment and management plan. Asha Grumbine, Carie Caddy, MD

## 2024-04-06 ENCOUNTER — Ambulatory Visit (HOSPITAL_COMMUNITY)
Admission: RE | Admit: 2024-04-06 | Discharge: 2024-04-06 | Disposition: A | Discharge status: Home / Self Care | Source: Ambulatory Visit | Attending: Gastroenterology | Admitting: Gastroenterology

## 2024-04-06 DIAGNOSIS — K824 Cholesterolosis of gallbladder: Secondary | ICD-10-CM | POA: Diagnosis not present

## 2024-04-06 DIAGNOSIS — K580 Irritable bowel syndrome with diarrhea: Secondary | ICD-10-CM | POA: Diagnosis not present

## 2024-04-06 DIAGNOSIS — K828 Other specified diseases of gallbladder: Secondary | ICD-10-CM | POA: Diagnosis not present

## 2024-04-06 DIAGNOSIS — K760 Fatty (change of) liver, not elsewhere classified: Secondary | ICD-10-CM | POA: Insufficient documentation

## 2024-04-07 ENCOUNTER — Telehealth: Payer: Self-pay

## 2024-04-07 NOTE — Telephone Encounter (Signed)
 Patient calling to request a referral to [facility], Pt has not had her eyes checked in 3 years. The number to fax the referral is [phone].   Thanks! Clovis Dar

## 2024-04-09 ENCOUNTER — Other Ambulatory Visit: Payer: Self-pay | Admitting: Student

## 2024-04-12 ENCOUNTER — Telehealth: Payer: Self-pay

## 2024-04-12 NOTE — Telephone Encounter (Signed)
 results

## 2024-04-25 ENCOUNTER — Other Ambulatory Visit: Payer: Self-pay | Admitting: Gastroenterology

## 2024-04-25 DIAGNOSIS — K580 Irritable bowel syndrome with diarrhea: Secondary | ICD-10-CM

## 2024-05-12 ENCOUNTER — Other Ambulatory Visit: Payer: Self-pay | Admitting: Student

## 2024-05-31 DIAGNOSIS — S83282D Other tear of lateral meniscus, current injury, left knee, subsequent encounter: Secondary | ICD-10-CM | POA: Diagnosis not present

## 2024-06-15 ENCOUNTER — Other Ambulatory Visit: Payer: Self-pay | Admitting: Gastroenterology

## 2024-06-15 DIAGNOSIS — K580 Irritable bowel syndrome with diarrhea: Secondary | ICD-10-CM

## 2024-07-07 ENCOUNTER — Other Ambulatory Visit (HOSPITAL_BASED_OUTPATIENT_CLINIC_OR_DEPARTMENT_OTHER): Payer: Self-pay | Admitting: Family Medicine

## 2024-07-07 DIAGNOSIS — Z1231 Encounter for screening mammogram for malignant neoplasm of breast: Secondary | ICD-10-CM

## 2024-07-25 ENCOUNTER — Other Ambulatory Visit: Payer: Self-pay

## 2024-07-25 MED ORDER — FAMOTIDINE 20 MG PO TABS: ORAL_TABLET | ORAL | 1 refills | Status: AC

## 2024-07-25 NOTE — Telephone Encounter (Signed)
 Chart reviewed. Rx refilled.

## 2024-08-02 ENCOUNTER — Ambulatory Visit: Payer: Self-pay | Admitting: Family Medicine

## 2024-08-02 ENCOUNTER — Ambulatory Visit: Admitting: Family Medicine

## 2024-08-02 VITALS — BP 123/80 | HR 65 | Ht 68.0 in | Wt 167.2 lb

## 2024-08-02 DIAGNOSIS — Z23 Encounter for immunization: Secondary | ICD-10-CM

## 2024-08-02 DIAGNOSIS — R200 Anesthesia of skin: Secondary | ICD-10-CM | POA: Diagnosis not present

## 2024-08-02 LAB — POCT GLYCOSYLATED HEMOGLOBIN (HGB A1C): Hemoglobin A1C: 5.4 % (ref 4.0–5.6)

## 2024-08-02 MED ORDER — DICLOFENAC SODIUM 1 % EX GEL: 2.0000 g | Freq: Four times a day (QID) | CUTANEOUS | 1 refills | Status: AC | PRN

## 2024-08-02 NOTE — Progress Notes (Signed)
    SUBJECTIVE:   CHIEF COMPLAINT / HPI:   Toe numbness Presenting for intermittent big toe numbness bilaterally, occurring over the past couple of weeks. Has corns/calluses on big toes. Nothing in particular brings on numbness, seems random. Lifelong Horticulturist, commercial.   OBJECTIVE:   BP 123/80   Pulse 65   Wt 167 lb 3.2 oz (75.8 kg)   SpO2 98%   BMI 25.61 kg/m   General: Well-appearing. Resting comfortably in room. Pulm: Breathing comfortably on room air. CTAB. No increased WOB. Skin:  Warm, dry. MSK: Normal proprioception of bilateral 1st toes. Intact sensation throughout all toes, subjectively decreased in bilateral 1st toes. Bilateral corns of medial 1st toes. No evidence of skin breakdown of bilateral feet. Normal gait.  Psych: Pleasant and appropriate.    ASSESSMENT/PLAN:   Assessment & Plan Numbness of toes Reassuring exam today. Suspect possible decreased sensation from corn/callus tissue vs neuropathy 2/2 to overuse, footwear, or natural aging. A1c normal today.  - Discussed supportive care at home - Discussed supportive, wide-toed shoes - Discussed home corn/callus care, may refer to podiatry if patient desires in future Encounter for immunization Received annual flu shot today.   RTC if not improving in next few weeks.   Sent for Voltaren  gel for chronic knee/leg pain per patient request. Patient typically buys this over the counter.   Damien Cassis, MD [facility]

## 2024-08-02 NOTE — Patient Instructions (Signed)
 Thank you for visiting clinic today and allowing us  to participate in your care!  Please try to wear supportive, wide-toe shoes. When you're at home, please elevate and rest you feet. Try to soak and use a pumice stone to reduce the corns on your feet. If you become interested in seeing podiatry, please let us  know.   If your symptoms are getting worse or do not improve in the next couple of weeks, please come back to see us .   Reach out any time with any questions or concerns you may have - we are here for you!  Damien Cassis, MD [facility] [phone]

## 2024-08-08 ENCOUNTER — Ambulatory Visit (HOSPITAL_BASED_OUTPATIENT_CLINIC_OR_DEPARTMENT_OTHER): Admitting: Radiology

## 2024-08-12 ENCOUNTER — Other Ambulatory Visit: Payer: Self-pay

## 2024-08-13 MED ORDER — ATORVASTATIN CALCIUM 10 MG PO TABS: 10.0000 mg | ORAL_TABLET | Freq: Every day | ORAL | 1 refills | Status: AC

## 2024-08-13 NOTE — Telephone Encounter (Signed)
 Chart reviewed. Rx refilled. Plan for lipid panel at next visit.

## 2024-08-15 DIAGNOSIS — H5203 Hypermetropia, bilateral: Secondary | ICD-10-CM | POA: Diagnosis not present

## 2024-08-25 ENCOUNTER — Other Ambulatory Visit: Payer: Self-pay | Admitting: *Deleted

## 2024-08-30 DIAGNOSIS — M170 Bilateral primary osteoarthritis of knee: Secondary | ICD-10-CM | POA: Diagnosis not present

## 2024-09-05 ENCOUNTER — Ambulatory Visit
Admission: RE | Admit: 2024-09-05 | Discharge: 2024-09-05 | Disposition: A | Discharge status: Home / Self Care | Source: Ambulatory Visit

## 2024-09-05 DIAGNOSIS — Z1231 Encounter for screening mammogram for malignant neoplasm of breast: Secondary | ICD-10-CM

## 2024-09-09 ENCOUNTER — Telehealth: Payer: Self-pay | Admitting: Gastroenterology

## 2024-09-09 NOTE — Telephone Encounter (Signed)
 Patient requesting f/u call ion regards to plan of care. Please advise. .  Thank you

## 2024-09-09 NOTE — Telephone Encounter (Signed)
 Called and spoke with patient. Patient wanted to know if there was a specific time frame for her to follow up for IBS. I advised that there is not as long as her symptoms are controlled. Patient also wanted to follow up on 03/2024 US  results. Informed her that there was no liver abnormality but did show gallbladder polyp. Patient was confused as to why 2023 US  showed fatty liver and most recent US  does not. I informed patient that a lot can change in 2 years and if she improved her diet and increased exercise that may have helped. Patient is aware that there is no specific follow up for the gallbladder polyp. Answered all of patient's questions. Patient verbalized understanding of all information and had no concerns at the end of the call.

## 2024-09-15 ENCOUNTER — Other Ambulatory Visit: Payer: Self-pay

## 2024-09-16 MED ORDER — VALACYCLOVIR HCL 1 G PO TABS: ORAL_TABLET | ORAL | 2 refills | Status: AC

## 2024-09-30 ENCOUNTER — Ambulatory Visit

## 2024-10-03 ENCOUNTER — Ambulatory Visit

## 2024-10-03 ENCOUNTER — Other Ambulatory Visit: Payer: Self-pay | Admitting: Family Medicine

## 2024-10-03 DIAGNOSIS — Z1231 Encounter for screening mammogram for malignant neoplasm of breast: Secondary | ICD-10-CM

## 2024-10-17 ENCOUNTER — Ambulatory Visit: Admitting: Family Medicine

## 2024-10-17 ENCOUNTER — Encounter: Payer: Self-pay | Admitting: Family Medicine

## 2024-10-17 VITALS — BP 116/76 | HR 74 | Ht 68.0 in | Wt 170.2 lb

## 2024-10-17 DIAGNOSIS — L821 Other seborrheic keratosis: Secondary | ICD-10-CM

## 2024-10-17 DIAGNOSIS — R109 Unspecified abdominal pain: Secondary | ICD-10-CM | POA: Diagnosis not present

## 2024-10-17 DIAGNOSIS — G5603 Carpal tunnel syndrome, bilateral upper limbs: Secondary | ICD-10-CM

## 2024-10-17 DIAGNOSIS — G8929 Other chronic pain: Secondary | ICD-10-CM

## 2024-10-17 MED ORDER — POLYETHYLENE GLYCOL 3350 17 GM/SCOOP PO POWD: 17.0000 g | Freq: Every day | ORAL | 0 refills | Status: AC | PRN

## 2024-10-17 NOTE — Patient Instructions (Signed)
 Thank you for visiting clinic today and allowing us  to participate in your care!  The mole of your back is a benign growth called a seborrheic keratitis. If it begins to bother you, please let us  know and we can help remove it for you.   Please try taking Miralax  daily as needed to help with any constipation.   Please use a wrist brace to help with your carpal tunnel syndrome. Try to keep your wrist in a straight, flat position as much as possible. Take Tylenol  and ibuprofen  as needed. Ice as needed.   Please schedule an appointment as needed if your symptoms are not improving.   Reach out any time with any questions or concerns you may have - we are here for you!  Damien Cassis, MD [facility] [phone]

## 2024-10-17 NOTE — Progress Notes (Signed)
" ° ° °  SUBJECTIVE:   CHIEF COMPLAINT / HPI:   Mole -Mole on back for years, increasing in size, sometimes itchy -Not particularly bothersome -Not interested in removing if benign   Abdominal pain -Intermittent random sharp pain behind belly button ongoing for last few years -Pain lasts for seconds, occurs maybe once a week or less -Sometimes constipated  -No bloody or black stools  -No nausea/vomiting   Wrist pain -Chronic numb and tingling pain of both wrists and hands, particularly first few digits -Sews for work   OBJECTIVE:   BP 116/76   Pulse 74   Ht 5' 8 (1.727 m)   Wt 170 lb 3.2 oz (77.2 kg)   SpO2 96%   BMI 25.88 kg/m   General: Well-appearing. Resting comfortably in room. CV: Normal S1/S2. No extra heart sounds. Warm and well-perfused. Pulm: Breathing comfortably on room air. CTAB. No increased WOB. Abd: Normoactive BS. Soft, non-tender, non-distended. No hernias.  MSK: Positive Phalen's test bilaterally.  Skin:  1cm ovoid keratose hyperpigmented lesion of R mid-upper back, appears consistent with seborrheic keratitis, no active bleeding or drainage. Warm, dry. Psych: Pleasant and appropriate.    ASSESSMENT/PLAN:   Assessment & Plan Seborrheic keratosis Presenting skin growth appears consistent with benign seborrheic keratosis. Discussed with patient. Patient defers removing at this time, will consider if it becomes more bothersome. Defers picture of SK in chart today.  Chronic intermittent abdominal pain Benign abdominal exam today. History and presentation not suggestive of acute abdominal concerns. Symptoms possible secondary to occasional constipation. Discussed miralax  as needed for constipation.  Bilateral carpal tunnel syndrome Presentation and exam consistent with bilateral carpal tunnel syndrome. Discussed nature of carpal tunnel and supportive care measures. Encouraged use of wrist brace to maintain neutral wrist positioning.    RTC if symptoms not  improving or as needed for any acute concerns.   Damien Cassis, MD [facility]  "

## 2024-10-18 NOTE — Assessment & Plan Note (Signed)
 Presenting skin growth appears consistent with benign seborrheic keratosis. Discussed with patient. Patient defers removing at this time, will consider if it becomes more bothersome. Defers picture of SK in chart today.

## 2024-10-24 ENCOUNTER — Telehealth: Payer: Self-pay

## 2024-10-24 NOTE — Telephone Encounter (Signed)
 Patient calls nurse line requesting a refill on Meloxicam  15mg .   She reports this was given to her earlier this year by another provider. She reports she has only been using sparingly. However, reports the medication really helps with her her knee and hand pains.   Advised will forward to PCP.

## 2024-10-26 ENCOUNTER — Other Ambulatory Visit: Payer: Self-pay | Admitting: Family Medicine

## 2024-10-26 NOTE — Progress Notes (Signed)
"  error  "

## 2024-10-31 ENCOUNTER — Ambulatory Visit

## 2024-10-31 ENCOUNTER — Ambulatory Visit
Admission: RE | Admit: 2024-10-31 | Discharge: 2024-10-31 | Disposition: A | Discharge status: Home / Self Care | Source: Ambulatory Visit

## 2024-10-31 DIAGNOSIS — Z1231 Encounter for screening mammogram for malignant neoplasm of breast: Secondary | ICD-10-CM

## 2024-11-03 ENCOUNTER — Ambulatory Visit: Payer: Self-pay | Admitting: Family Medicine

## 2024-11-03 ENCOUNTER — Other Ambulatory Visit: Payer: Self-pay

## 2024-11-03 ENCOUNTER — Other Ambulatory Visit: Payer: Self-pay | Admitting: Family Medicine

## 2024-11-03 DIAGNOSIS — R928 Other abnormal and inconclusive findings on diagnostic imaging of breast: Secondary | ICD-10-CM

## 2024-11-04 ENCOUNTER — Other Ambulatory Visit: Payer: Self-pay | Admitting: Family Medicine

## 2024-11-04 DIAGNOSIS — R928 Other abnormal and inconclusive findings on diagnostic imaging of breast: Secondary | ICD-10-CM

## 2024-11-08 ENCOUNTER — Other Ambulatory Visit: Payer: Self-pay | Admitting: *Deleted

## 2024-11-08 MED ORDER — ESCITALOPRAM OXALATE 20 MG PO TABS: 20.0000 mg | ORAL_TABLET | Freq: Every day | ORAL | 1 refills | Status: AC

## 2024-11-14 ENCOUNTER — Ambulatory Visit

## 2024-11-14 ENCOUNTER — Ambulatory Visit
Admission: RE | Admit: 2024-11-14 | Discharge: 2024-11-14 | Disposition: A | Source: Ambulatory Visit | Attending: Family Medicine | Admitting: Family Medicine

## 2024-11-14 DIAGNOSIS — R928 Other abnormal and inconclusive findings on diagnostic imaging of breast: Secondary | ICD-10-CM

## 2024-11-21 ENCOUNTER — Other Ambulatory Visit

## 2024-11-21 ENCOUNTER — Encounter
